# Patient Record
Sex: Male | Born: 1945
Health system: Southern US, Community
[De-identification: ages and names within clinical notes are randomized; demographics above are authoritative.]

## PROBLEM LIST (undated history)

## (undated) DIAGNOSIS — I10 Essential (primary) hypertension: Secondary | ICD-10-CM

## (undated) DIAGNOSIS — E1159 Type 2 diabetes mellitus with other circulatory complications: Secondary | ICD-10-CM

## (undated) DIAGNOSIS — N189 Chronic kidney disease, unspecified: Secondary | ICD-10-CM

## (undated) DIAGNOSIS — I82409 Acute embolism and thrombosis of unspecified deep veins of unspecified lower extremity: Secondary | ICD-10-CM

## (undated) DIAGNOSIS — I739 Peripheral vascular disease, unspecified: Secondary | ICD-10-CM

## (undated) DIAGNOSIS — I5032 Chronic diastolic (congestive) heart failure: Secondary | ICD-10-CM

## (undated) DIAGNOSIS — E119 Type 2 diabetes mellitus without complications: Secondary | ICD-10-CM

## (undated) DIAGNOSIS — M1990 Unspecified osteoarthritis, unspecified site: Secondary | ICD-10-CM

## (undated) DIAGNOSIS — E1169 Type 2 diabetes mellitus with other specified complication: Secondary | ICD-10-CM

## (undated) DIAGNOSIS — I219 Acute myocardial infarction, unspecified: Secondary | ICD-10-CM

## (undated) DIAGNOSIS — I209 Angina pectoris, unspecified: Secondary | ICD-10-CM

## (undated) DIAGNOSIS — Z87438 Personal history of other diseases of male genital organs: Secondary | ICD-10-CM

## (undated) DIAGNOSIS — I509 Heart failure, unspecified: Secondary | ICD-10-CM

## (undated) DIAGNOSIS — I2510 Atherosclerotic heart disease of native coronary artery without angina pectoris: Secondary | ICD-10-CM

## (undated) HISTORY — DX: Atherosclerotic heart disease of native coronary artery without angina pectoris: I25.10

## (undated) HISTORY — DX: Heart failure, unspecified: I50.9

## (undated) HISTORY — DX: Acute myocardial infarction, unspecified: I21.9

## (undated) HISTORY — DX: Unspecified osteoarthritis, unspecified site: M19.90

## (undated) HISTORY — DX: Essential (primary) hypertension: I10

## (undated) HISTORY — DX: Personal history of other diseases of male genital organs: Z87.438

## (undated) HISTORY — DX: Peripheral vascular disease, unspecified: I73.9

## (undated) HISTORY — DX: Type 2 diabetes mellitus without complications: E11.9

## (undated) HISTORY — DX: Acute embolism and thrombosis of unspecified deep veins of unspecified lower extremity: I82.409

## (undated) HISTORY — DX: Chronic diastolic (congestive) heart failure: I50.32

## (undated) HISTORY — PX: HERNIA REPAIR: SHX51

## (undated) HISTORY — DX: Chronic kidney disease, unspecified: N18.9

## (undated) HISTORY — PX: INGUINAL HERNIA REPAIR: SHX194

## (undated) HISTORY — DX: Type 2 diabetes mellitus with other circulatory complications: E11.59

## (undated) HISTORY — DX: Angina pectoris, unspecified: I20.9

---
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
## 2012-05-14 LAB — HM COLONOSCOPY

## 2014-10-27 DIAGNOSIS — H04123 Dry eye syndrome of bilateral lacrimal glands: Secondary | ICD-10-CM | POA: Diagnosis not present

## 2014-10-27 DIAGNOSIS — H02824 Cysts of left upper eyelid: Secondary | ICD-10-CM | POA: Diagnosis not present

## 2014-10-28 DIAGNOSIS — I10 Essential (primary) hypertension: Secondary | ICD-10-CM | POA: Diagnosis not present

## 2014-10-28 DIAGNOSIS — I2510 Atherosclerotic heart disease of native coronary artery without angina pectoris: Secondary | ICD-10-CM | POA: Diagnosis not present

## 2014-10-28 DIAGNOSIS — I5032 Chronic diastolic (congestive) heart failure: Secondary | ICD-10-CM | POA: Diagnosis not present

## 2014-10-28 DIAGNOSIS — E785 Hyperlipidemia, unspecified: Secondary | ICD-10-CM | POA: Diagnosis not present

## 2014-10-28 DIAGNOSIS — I739 Peripheral vascular disease, unspecified: Secondary | ICD-10-CM | POA: Diagnosis not present

## 2015-02-15 DIAGNOSIS — Z7982 Long term (current) use of aspirin: Secondary | ICD-10-CM | POA: Diagnosis not present

## 2015-02-15 DIAGNOSIS — S335XXA Sprain of ligaments of lumbar spine, initial encounter: Secondary | ICD-10-CM | POA: Diagnosis not present

## 2015-02-15 DIAGNOSIS — Z79899 Other long term (current) drug therapy: Secondary | ICD-10-CM | POA: Diagnosis not present

## 2015-02-15 DIAGNOSIS — S39012A Strain of muscle, fascia and tendon of lower back, initial encounter: Secondary | ICD-10-CM | POA: Diagnosis not present

## 2015-02-15 DIAGNOSIS — I10 Essential (primary) hypertension: Secondary | ICD-10-CM | POA: Diagnosis not present

## 2015-03-24 DIAGNOSIS — R05 Cough: Secondary | ICD-10-CM | POA: Diagnosis not present

## 2015-03-24 DIAGNOSIS — R079 Chest pain, unspecified: Secondary | ICD-10-CM | POA: Diagnosis not present

## 2015-03-24 DIAGNOSIS — R0789 Other chest pain: Secondary | ICD-10-CM | POA: Diagnosis not present

## 2015-03-24 DIAGNOSIS — I10 Essential (primary) hypertension: Secondary | ICD-10-CM | POA: Diagnosis not present

## 2015-05-03 DIAGNOSIS — I10 Essential (primary) hypertension: Secondary | ICD-10-CM | POA: Diagnosis not present

## 2015-05-03 DIAGNOSIS — I739 Peripheral vascular disease, unspecified: Secondary | ICD-10-CM | POA: Diagnosis not present

## 2015-05-03 DIAGNOSIS — E785 Hyperlipidemia, unspecified: Secondary | ICD-10-CM | POA: Diagnosis not present

## 2015-05-03 DIAGNOSIS — J019 Acute sinusitis, unspecified: Secondary | ICD-10-CM | POA: Diagnosis not present

## 2015-05-03 DIAGNOSIS — Z6832 Body mass index (BMI) 32.0-32.9, adult: Secondary | ICD-10-CM | POA: Diagnosis not present

## 2015-05-04 DIAGNOSIS — I10 Essential (primary) hypertension: Secondary | ICD-10-CM | POA: Diagnosis not present

## 2015-05-04 DIAGNOSIS — I739 Peripheral vascular disease, unspecified: Secondary | ICD-10-CM | POA: Diagnosis not present

## 2015-05-04 DIAGNOSIS — E785 Hyperlipidemia, unspecified: Secondary | ICD-10-CM | POA: Diagnosis not present

## 2015-11-01 DIAGNOSIS — Z6832 Body mass index (BMI) 32.0-32.9, adult: Secondary | ICD-10-CM | POA: Diagnosis not present

## 2015-11-01 DIAGNOSIS — J180 Bronchopneumonia, unspecified organism: Secondary | ICD-10-CM | POA: Diagnosis not present

## 2015-11-01 DIAGNOSIS — I5032 Chronic diastolic (congestive) heart failure: Secondary | ICD-10-CM | POA: Diagnosis not present

## 2015-11-01 DIAGNOSIS — I10 Essential (primary) hypertension: Secondary | ICD-10-CM | POA: Diagnosis not present

## 2015-11-01 DIAGNOSIS — E785 Hyperlipidemia, unspecified: Secondary | ICD-10-CM | POA: Diagnosis not present

## 2015-11-01 DIAGNOSIS — N400 Enlarged prostate without lower urinary tract symptoms: Secondary | ICD-10-CM | POA: Diagnosis not present

## 2015-11-03 DIAGNOSIS — E785 Hyperlipidemia, unspecified: Secondary | ICD-10-CM | POA: Diagnosis not present

## 2015-11-03 DIAGNOSIS — N400 Enlarged prostate without lower urinary tract symptoms: Secondary | ICD-10-CM | POA: Diagnosis not present

## 2015-11-03 DIAGNOSIS — I10 Essential (primary) hypertension: Secondary | ICD-10-CM | POA: Diagnosis not present

## 2015-11-03 DIAGNOSIS — I5030 Unspecified diastolic (congestive) heart failure: Secondary | ICD-10-CM | POA: Diagnosis not present

## 2015-11-30 DIAGNOSIS — Z6832 Body mass index (BMI) 32.0-32.9, adult: Secondary | ICD-10-CM | POA: Diagnosis not present

## 2015-11-30 DIAGNOSIS — J180 Bronchopneumonia, unspecified organism: Secondary | ICD-10-CM | POA: Diagnosis not present

## 2016-03-23 DIAGNOSIS — L03115 Cellulitis of right lower limb: Secondary | ICD-10-CM | POA: Diagnosis not present

## 2016-03-23 DIAGNOSIS — Z6832 Body mass index (BMI) 32.0-32.9, adult: Secondary | ICD-10-CM | POA: Diagnosis not present

## 2016-03-23 DIAGNOSIS — M79604 Pain in right leg: Secondary | ICD-10-CM | POA: Diagnosis not present

## 2016-03-23 DIAGNOSIS — I82409 Acute embolism and thrombosis of unspecified deep veins of unspecified lower extremity: Secondary | ICD-10-CM | POA: Diagnosis not present

## 2016-03-30 DIAGNOSIS — Z6832 Body mass index (BMI) 32.0-32.9, adult: Secondary | ICD-10-CM | POA: Diagnosis not present

## 2016-03-30 DIAGNOSIS — L03115 Cellulitis of right lower limb: Secondary | ICD-10-CM | POA: Diagnosis not present

## 2016-04-24 DIAGNOSIS — J180 Bronchopneumonia, unspecified organism: Secondary | ICD-10-CM | POA: Diagnosis not present

## 2016-05-01 DIAGNOSIS — I10 Essential (primary) hypertension: Secondary | ICD-10-CM | POA: Diagnosis not present

## 2016-05-01 DIAGNOSIS — N400 Enlarged prostate without lower urinary tract symptoms: Secondary | ICD-10-CM | POA: Diagnosis not present

## 2016-05-01 DIAGNOSIS — I5032 Chronic diastolic (congestive) heart failure: Secondary | ICD-10-CM | POA: Diagnosis not present

## 2016-05-01 DIAGNOSIS — I2510 Atherosclerotic heart disease of native coronary artery without angina pectoris: Secondary | ICD-10-CM | POA: Diagnosis not present

## 2016-05-01 DIAGNOSIS — I739 Peripheral vascular disease, unspecified: Secondary | ICD-10-CM | POA: Diagnosis not present

## 2016-05-01 DIAGNOSIS — E785 Hyperlipidemia, unspecified: Secondary | ICD-10-CM | POA: Diagnosis not present

## 2016-05-26 DIAGNOSIS — R252 Cramp and spasm: Secondary | ICD-10-CM | POA: Diagnosis not present

## 2016-06-26 DIAGNOSIS — J019 Acute sinusitis, unspecified: Secondary | ICD-10-CM | POA: Diagnosis not present

## 2016-10-30 DIAGNOSIS — Z23 Encounter for immunization: Secondary | ICD-10-CM | POA: Diagnosis not present

## 2016-10-30 DIAGNOSIS — I10 Essential (primary) hypertension: Secondary | ICD-10-CM | POA: Diagnosis not present

## 2016-10-30 DIAGNOSIS — E782 Mixed hyperlipidemia: Secondary | ICD-10-CM | POA: Diagnosis not present

## 2016-10-30 DIAGNOSIS — I5032 Chronic diastolic (congestive) heart failure: Secondary | ICD-10-CM | POA: Diagnosis not present

## 2016-10-30 DIAGNOSIS — I2510 Atherosclerotic heart disease of native coronary artery without angina pectoris: Secondary | ICD-10-CM | POA: Diagnosis not present

## 2016-10-30 DIAGNOSIS — N400 Enlarged prostate without lower urinary tract symptoms: Secondary | ICD-10-CM | POA: Diagnosis not present

## 2016-10-30 DIAGNOSIS — N401 Enlarged prostate with lower urinary tract symptoms: Secondary | ICD-10-CM | POA: Diagnosis not present

## 2017-01-23 DIAGNOSIS — M19072 Primary osteoarthritis, left ankle and foot: Secondary | ICD-10-CM | POA: Diagnosis not present

## 2017-01-23 DIAGNOSIS — M659 Synovitis and tenosynovitis, unspecified: Secondary | ICD-10-CM | POA: Diagnosis not present

## 2017-01-23 DIAGNOSIS — M19071 Primary osteoarthritis, right ankle and foot: Secondary | ICD-10-CM | POA: Diagnosis not present

## 2017-01-30 DIAGNOSIS — M7751 Other enthesopathy of right foot: Secondary | ICD-10-CM | POA: Diagnosis not present

## 2017-01-30 DIAGNOSIS — M659 Synovitis and tenosynovitis, unspecified: Secondary | ICD-10-CM | POA: Diagnosis not present

## 2017-01-30 DIAGNOSIS — M7752 Other enthesopathy of left foot: Secondary | ICD-10-CM | POA: Diagnosis not present

## 2017-03-01 DIAGNOSIS — E782 Mixed hyperlipidemia: Secondary | ICD-10-CM | POA: Diagnosis not present

## 2017-03-01 DIAGNOSIS — R7303 Prediabetes: Secondary | ICD-10-CM | POA: Diagnosis not present

## 2017-03-01 DIAGNOSIS — N400 Enlarged prostate without lower urinary tract symptoms: Secondary | ICD-10-CM | POA: Diagnosis not present

## 2017-03-01 DIAGNOSIS — I2510 Atherosclerotic heart disease of native coronary artery without angina pectoris: Secondary | ICD-10-CM | POA: Diagnosis not present

## 2017-03-01 DIAGNOSIS — I5032 Chronic diastolic (congestive) heart failure: Secondary | ICD-10-CM | POA: Diagnosis not present

## 2017-03-01 DIAGNOSIS — I10 Essential (primary) hypertension: Secondary | ICD-10-CM | POA: Diagnosis not present

## 2017-07-03 DIAGNOSIS — E782 Mixed hyperlipidemia: Secondary | ICD-10-CM | POA: Diagnosis not present

## 2017-07-03 DIAGNOSIS — Z125 Encounter for screening for malignant neoplasm of prostate: Secondary | ICD-10-CM | POA: Diagnosis not present

## 2017-07-03 DIAGNOSIS — R7303 Prediabetes: Secondary | ICD-10-CM | POA: Diagnosis not present

## 2017-07-03 DIAGNOSIS — I2510 Atherosclerotic heart disease of native coronary artery without angina pectoris: Secondary | ICD-10-CM | POA: Diagnosis not present

## 2017-07-03 DIAGNOSIS — N400 Enlarged prostate without lower urinary tract symptoms: Secondary | ICD-10-CM | POA: Diagnosis not present

## 2017-07-03 DIAGNOSIS — E1159 Type 2 diabetes mellitus with other circulatory complications: Secondary | ICD-10-CM | POA: Diagnosis not present

## 2017-07-03 DIAGNOSIS — I5032 Chronic diastolic (congestive) heart failure: Secondary | ICD-10-CM | POA: Diagnosis not present

## 2017-07-03 DIAGNOSIS — I10 Essential (primary) hypertension: Secondary | ICD-10-CM | POA: Diagnosis not present

## 2017-07-03 DIAGNOSIS — M170 Bilateral primary osteoarthritis of knee: Secondary | ICD-10-CM | POA: Diagnosis not present

## 2017-07-09 ENCOUNTER — Other Ambulatory Visit: Payer: Self-pay

## 2017-07-09 NOTE — Patient Outreach (Signed)
[facility]) Care Management  07/09/2017  Johansel Steiner Schollmeyer [birth year] [id number]   Telephone Screen  Referral Date: 07/09/17 Referral Source: MD office( Dr Marina Goodell) Referral Reason: "Diabetic Education" Insurance: [facility]   Outreach attempt # 1  to patient at [phone]. Male answered and reported wrong number. RN CM attempted alternate number([phone]) found in MD notes. No answer at present. RN CM left HIPAA compliant voicemail message along with contact info.       Plan: RN CM will make outreach attempt to patient within three business days if no return call.    Antionette Fairy, RN,BSN,CCM [facility] Care Management Telephonic Care Management Coordinator Direct Phone: [phone] Toll Free: [phone] Fax: [phone]

## 2017-07-12 ENCOUNTER — Other Ambulatory Visit: Payer: Self-pay

## 2017-07-12 NOTE — Patient Outreach (Signed)
[facility]) Care Management  07/12/2017  Mark [name] 1946-03-30 [id number]   Telephone Screen  Referral Date: 07/09/17 Referral Source: MD office( Dr Marina Goodell) Referral Reason: "Diabetic Education" Insurance: [facility]    Outreach attempt #2 to patient. Spoke with patient. Screening completed.   Social: Patient states he resides in his home alone. He voices he is independent with ADLs/IADLs. He is able to drive himself to appts. He denies any falls and having any DME in the home.   Conditions: Patient has PMH of CKD, HTN, HLD, bilateral osteoarthritis to knees, HF(patient denies) and DM. Patient states he was just told at last MD appt that he has Diabetes. Per notes patient was diagnosed in July 2018. He is currently not taking any meds and monitoring cbgs in the home as diabetes is fairly controlled. He voices MD has not advised him to monitor cbgs yet.Lat A1C was 6.1(07/03/17). He voices that MD has set him up to attend a three day Diabetes class which is first session is on 07/18/17. Patient voices he wants all the education and support he can get to help keep diabetes under controlled. He states that his sister has diabetes and has to take insulin daily. Patient reported he "would rather die than take insulin." He stets he is just that terrified of shots and needles. Patient inquired if he was going to die soon" from this diabetes. Advised patient that this was not a death sentence and people with diabetes can live a long time if they mange what they eat and adhere to diabetic rules and MD orders. He voiced understanding and felt better knowing this.   Medications: Patient reports he takes seven pills one day and the next day five pills. He self manages his meds. He gets meds through [facility] mail order for free. He report the only med he pays for is Nitro tabs which he rarely ever has to take.  Appointments: He voices that he is only followed by his PCP and  goes back 07/30/17.  Advance Directives: Patient does not have any. RN CM provided education on importance of completing forms. He will think about it.  Consent: [facility] services reviewed and discussed with patient. Patient gave verbal consent for [facility] services.    Plan: RN CM will notify [facility] administrative assistant of case status. RN CM will send [facility] RN Health Coach referral for further Diabetes education and support.  Antionette Fairy, RN,BSN,CCM [facility] Care Management Telephonic Care Management Coordinator Direct Phone: [phone] Toll Free: [phone] Fax: [phone]

## 2017-07-13 ENCOUNTER — Encounter: Payer: Self-pay | Admitting: *Deleted

## 2017-07-18 DIAGNOSIS — E1159 Type 2 diabetes mellitus with other circulatory complications: Secondary | ICD-10-CM | POA: Diagnosis not present

## 2017-07-20 ENCOUNTER — Other Ambulatory Visit: Payer: Self-pay | Admitting: *Deleted

## 2017-07-20 ENCOUNTER — Encounter: Payer: Self-pay | Admitting: *Deleted

## 2017-07-20 ENCOUNTER — Other Ambulatory Visit: Payer: Self-pay

## 2017-07-20 DIAGNOSIS — N183 Chronic kidney disease, stage 3 unspecified: Secondary | ICD-10-CM

## 2017-07-20 DIAGNOSIS — E119 Type 2 diabetes mellitus without complications: Secondary | ICD-10-CM | POA: Insufficient documentation

## 2017-07-20 DIAGNOSIS — I25119 Atherosclerotic heart disease of native coronary artery with unspecified angina pectoris: Secondary | ICD-10-CM | POA: Insufficient documentation

## 2017-07-20 DIAGNOSIS — I10 Essential (primary) hypertension: Secondary | ICD-10-CM

## 2017-07-20 DIAGNOSIS — E785 Hyperlipidemia, unspecified: Secondary | ICD-10-CM | POA: Insufficient documentation

## 2017-07-20 DIAGNOSIS — I82409 Acute embolism and thrombosis of unspecified deep veins of unspecified lower extremity: Secondary | ICD-10-CM | POA: Insufficient documentation

## 2017-07-20 DIAGNOSIS — I5032 Chronic diastolic (congestive) heart failure: Secondary | ICD-10-CM | POA: Insufficient documentation

## 2017-07-20 DIAGNOSIS — Z87438 Personal history of other diseases of male genital organs: Secondary | ICD-10-CM | POA: Insufficient documentation

## 2017-07-20 DIAGNOSIS — I2510 Atherosclerotic heart disease of native coronary artery without angina pectoris: Secondary | ICD-10-CM | POA: Insufficient documentation

## 2017-07-20 DIAGNOSIS — I219 Acute myocardial infarction, unspecified: Secondary | ICD-10-CM | POA: Insufficient documentation

## 2017-07-20 DIAGNOSIS — I739 Peripheral vascular disease, unspecified: Secondary | ICD-10-CM

## 2017-07-20 HISTORY — DX: Atherosclerotic heart disease of native coronary artery with unspecified angina pectoris: I25.119

## 2017-07-20 HISTORY — DX: Hyperlipidemia, unspecified: E78.5

## 2017-07-20 NOTE — Patient Outreach (Signed)
[facility] Care Management  [facility]  07/20/2017   Tedd SiasClarence L [name] 10/19/1945 [id number]  RN Health Coach Initial Assessment  Referral Date:  07/12/2017 Referral Source:  MD Office (Dr. Marina GoodellPerry) Reason for Referral:  Diabetes Education and support Insurance:  [facility]   Outreach Attempt: Successful telephone outreach to patient.  HIPAA verified.  Initial telephone assessment completed with patient.  Social:  Patient lives at home with girlfriend.  Independent with ADLs and IADLs.  Currently still drives and transports himself to his medical appointments.  DME in home include CBG meter he just received and has not used it yet.  Patient has not been educated on how to self monitor blood sugars as of yet, and states he is in the process of learning this task at his diabetes classes.  Conditions:  Per chart review and discussion with patient, PMH include:  Newly diagnosed diabetes, CHF, MI, hyperlipidemia, hypertension, chronic kidney disease, BPH, peripheral vascular disease, DVT, and coronary artery disease.  Patient reports he was told previously he was prediabetic and at his recent medical appointment, he was told he is now a diabetic.  Current A1C is 6.1.  He is attending Diabetes Education classes, a series of 3 courses.  Attended his first course 07/18/2017, the next 2 are scheduled for 07/23/2017 and 07/25/2017. Patient states they are going to teach him how to use his CBG meter and monitor his blood glucose during these educational sessions.  Reports being very active, walking 5 miles daily with plans to start lifting weights in the gym.  Patient states he had a fall without injury this past Monday after his foot got stuck in the mud.  Falls prevention discussed with patient.  Reports he has several missing front teeth, making it difficult to chew foods.  Encouraged patient to make dental appointment.  Medications: Patient reports compliance with daily  medications and denies issues with affording medications.  Encounter Medications:  Outpatient Encounter Medications as of 07/20/2017  Medication Sig  . aspirin 81 MG chewable tablet Chew by mouth daily.  Marland Kitchen. atorvastatin (LIPITOR) 20 MG tablet Take 20 mg by mouth daily.  . carvedilol (COREG) 6.25 MG tablet Take 6.25 mg by mouth daily.  . furosemide (LASIX) 20 MG tablet Take 20 mg by mouth daily.  . magnesium 30 MG tablet Take 30 mg by mouth daily.  . metFORMIN (GLUCOPHAGE) 500 MG tablet Take by mouth daily with breakfast.  . nitroGLYCERIN (NITROSTAT) 0.4 MG SL tablet Place 0.4 mg under the tongue every 5 (five) minutes as needed for chest pain.  Marland Kitchen. spironolactone (ALDACTONE) 25 MG tablet Take 25 mg by mouth daily.   No facility-administered encounter medications on file as of 07/20/2017.     Functional Status:  In your present state of health, do you have any difficulty performing the following activities: 07/20/2017  Hearing? N  Vision? N  Difficulty concentrating or making decisions? N  Walking or climbing stairs? N  Dressing or bathing? N  Doing errands, shopping? N  Preparing Food and eating ? N  Using the Toilet? N  In the past six months, have you accidently leaked urine? N  Do you have problems with loss of bowel control? N  Managing your Medications? N  Managing your Finances? N  Housekeeping or managing your Housekeeping? N  Some recent data might be hidden    Fall/Depression Screening: Fall Risk  07/20/2017 07/12/2017  Falls in the past year? Yes No  Number falls in  past yr: 1 -  Injury with Fall? No -  Risk for fall due to : History of fall(s) -  Follow up Education provided;Falls prevention discussed -   PHQ 2/9 Scores 07/20/2017 07/12/2017  PHQ - 2 Score 0 0   THN CM Care Plan Problem One     Most Recent Value  Care Plan Problem One  Knowledge deficiet related to diagnosis of diabetes  Role Documenting the Problem One  Health Coach  Care Plan for Problem One   Active  THN Long Term Goal   Patient will lose 10 pounds in the next 90 days.  THN Long Term Goal Start Date  07/20/17  Interventions for Problem One Long Term Goal  Current care plan reviewed and discussed with patient, encouraged to keep medical appointment, encouarged to continue to attend diabetes educational classes, congratualted and encouraged  continuing to walk 5 miles a day  [facility] CM Short Term Goal #1   Patient will learn to perform self CBG monitoring in the next 30 days.  THN CM Short Term Goal #1 Start Date  07/20/17  Interventions for Short Term Goal #1  Congratulated patient on obtaining CBG meter, encouraged patient to continue to attend diabetes educational classes, encouraged patient to take meter with him to class to have someone instruct him on CBG monitoring, Sending Living Well with Diabetes Booklet to patient  [facility] CM Short Term Goal #2   Patient will review diabetes educational booklet and material in the next 30 days.  THN CM Short Term Goal #2 Start Date  07/20/17  Interventions for Short Term Goal #2  RN Health Coach to send Living Well with Diabetes Booklet, encouraged patient to continue with diabetes educational classes     Appointments:  Patient last saw, Dr. Marina Goodell, Primary Care Provider on 07/03/2017.  Next medical appointment is scheduled for 07/30/2017.  Diabetes Educational classes are scheduled as above.  Advanced Directives:   Patient denies advance directive in place.  Requesting information on advance directives.   Consent:  [facility] discussed and reviewed with patient.  Patient verbally consents to [facility].  Plan: RN Health Coach to send patient Conservation officer, historic buildings. RN Health Coach to send patient Living Well with Diabetes Booklet. RN Health Coach to send patient 2019 Calendar Booklet. RN Health Coach to send patient Forensic scientist. Patient to review educational material over the next month. RN Health Coach will send primary MD barriers  letter. RN Health Coach to make next monthly telephone outreach in the month of January.  [name] RN [facility] Care Management  RN Health Coach [phone] [email or web address]

## 2017-07-25 DIAGNOSIS — E1159 Type 2 diabetes mellitus with other circulatory complications: Secondary | ICD-10-CM | POA: Diagnosis not present

## 2017-07-30 DIAGNOSIS — Z6833 Body mass index (BMI) 33.0-33.9, adult: Secondary | ICD-10-CM | POA: Diagnosis not present

## 2017-07-30 DIAGNOSIS — Z0000 Encounter for general adult medical examination without abnormal findings: Secondary | ICD-10-CM | POA: Diagnosis not present

## 2017-08-15 DIAGNOSIS — E1159 Type 2 diabetes mellitus with other circulatory complications: Secondary | ICD-10-CM | POA: Diagnosis not present

## 2017-08-21 ENCOUNTER — Other Ambulatory Visit: Payer: Self-pay | Admitting: *Deleted

## 2017-08-21 ENCOUNTER — Encounter: Payer: Self-pay | Admitting: *Deleted

## 2017-08-21 NOTE — Patient Outreach (Signed)
[facility]) Care Management  08/21/2017  Mark [name] 12/10/1945 [id number]  RN Health Coach Monthly Outreach  Referral Date:  07/12/2017 Referral Source:  MD Office (Dr. Marina Goodell) Reason for Referral:  Diabetes Education and Support Insurance:  [facility] Medicare   Outreach Attempt:  Successful telephone outreach to patient for monthly follow up.  HIPAA confirmed.  Patient stating he is doing well, in the middle of his morning walk currently.  States he has completed the 3 course Diabetes Educational Classes last month and has follow up class in April.  Patient reports he was taught how to take his blood sugars with the his CBG meter in the classes and has been monitoring his blood sugars daily.  States he has not taken CBG yet this morning.  When asked about fasting blood sugar ranges, patient states "they have been around 6.9".  Discussed normal CBG ranges with patient.  Patient states he will check his CBG meter when he returns home from his walk.  Encouraged patient to contact RN Health Coach if CBG reading seems out of normal numbers.  Verbalizes he has received packets from [facility] and continues to review material mailed to him.  Appointments:  Patient attended scheduled appointment with Dr. Marina Goodell on 07/30/2017 and has next follow up appointment in March 2019.  States he has a follow up diabetes educational appointment in April 2019.  Plan:  RN Health Coach will make next monthly telephone outreach to patient in the month of February.   [name] RN [facility] Care Management  RN Health Coach [phone] [email or web address]

## 2017-09-18 ENCOUNTER — Other Ambulatory Visit: Payer: Self-pay | Admitting: *Deleted

## 2017-09-18 ENCOUNTER — Encounter: Payer: Self-pay | Admitting: *Deleted

## 2017-09-18 NOTE — Patient Outreach (Signed)
[facility]) Care Management  09/18/2017  Mark [name] 06-28-1946 [id number]   RN Health Coach Monthly Outreach  Referral Date:  07/12/2017 Referral Source:  MD Office (Dr. Marina Goodell) Reason for Referral:  Diabetes Education and Support Insurance:  [facility] Medicare   Outreach Attempt:  Successful telephone outreach to patient for monthly follow up.  HIPAA verified with patient.  Patient stating he is doing well.  Verbalizes he has not been able to monitor his blood sugars because he does not know how to use the meter.  States he was told to bring his meter to the next Diabetes class in April, although his physician wants his blood sugars monitored daily.  Patient encouraged to contact the physician to verify how often he is to monitor his blood sugars and to go by the physician's office to see if there is a staff member there to educate him on how to use the CBG meter.  Continues to walk daily for exercise.  Appointments:  Patient reports having a scheduled appointment with Dr. Marina Goodell on 11/13/2017 and having Diabetes Education class on 11/13/2017 also.  Plan: RN Health Coach will send patient EMMI on Low Blood Sugar Discharge Instructions, Adult. RN Health Coach will make next monthly outreach to patient in the month of March.   [name] RN [facility] Care Management  RN Health Coach [phone] [email or web address]

## 2017-09-25 ENCOUNTER — Other Ambulatory Visit: Payer: Self-pay | Admitting: *Deleted

## 2017-09-25 NOTE — Patient Outreach (Signed)
[facility]) Care Management  09/25/2017  Rocko Yarman Sunday [birth year] [id number]   RN Health Coach Monthly Outreach  Referral Date:07/12/2017 Referral Source:MD Office (Dr. Marina Goodell) Reason for Referral:Diabetes Education and Support Insurance:[employer]   Outreach Attempt:  Received telephone call from patient.  HIPAA verified with patient.  Patient received education letter with EMMI for hypoglycemia and was calling to confirm letter was from [employer].  Reviewed Educational Letter with patient and EMMI on hypoglycemia.  Discussed need for patient to learn signs and symptoms of hypoglycemia and explained the EMMI education was in addition to the Living Well with Diabetes Booklet.  Patient stating he has his medical follow up appointment on March 20 and request RN Health Coach do monthly telephone call after this appointment.  Patient encouraged to take his meter to this follow up appointment, if not sooner, to have staff assist him in using the meter.  Plan:  RN Health Coach will reschedule monthly telephone outreach to patient for the month of March after 10/31/2017 per patient's request.  [name] RN [facility] Care Management  RN Health Coach [phone] [email or web address]

## 2017-10-19 ENCOUNTER — Ambulatory Visit: Payer: Medicare HMO | Admitting: *Deleted

## 2017-10-31 DIAGNOSIS — I5032 Chronic diastolic (congestive) heart failure: Secondary | ICD-10-CM | POA: Diagnosis not present

## 2017-10-31 DIAGNOSIS — N400 Enlarged prostate without lower urinary tract symptoms: Secondary | ICD-10-CM | POA: Diagnosis not present

## 2017-10-31 DIAGNOSIS — I110 Hypertensive heart disease with heart failure: Secondary | ICD-10-CM | POA: Diagnosis not present

## 2017-10-31 DIAGNOSIS — E1159 Type 2 diabetes mellitus with other circulatory complications: Secondary | ICD-10-CM | POA: Diagnosis not present

## 2017-10-31 DIAGNOSIS — M170 Bilateral primary osteoarthritis of knee: Secondary | ICD-10-CM | POA: Diagnosis not present

## 2017-10-31 DIAGNOSIS — I2510 Atherosclerotic heart disease of native coronary artery without angina pectoris: Secondary | ICD-10-CM | POA: Diagnosis not present

## 2017-10-31 DIAGNOSIS — E782 Mixed hyperlipidemia: Secondary | ICD-10-CM | POA: Diagnosis not present

## 2017-10-31 DIAGNOSIS — I10 Essential (primary) hypertension: Secondary | ICD-10-CM | POA: Diagnosis not present

## 2017-10-31 DIAGNOSIS — E119 Type 2 diabetes mellitus without complications: Secondary | ICD-10-CM | POA: Diagnosis not present

## 2017-11-02 ENCOUNTER — Encounter: Payer: Self-pay | Admitting: *Deleted

## 2017-11-02 ENCOUNTER — Other Ambulatory Visit: Payer: Self-pay | Admitting: *Deleted

## 2017-11-02 NOTE — Patient Outreach (Signed)
[facility]) Care Management  [place]  11/02/2017   Mark [name] 25-Jun-1946 [id number]   RN Health Coach Monthly Outreach   Referral Date:  07/12/2017 Referral Source:  MD Office (Dr. Henrene Pastor) Reason for Referral:  Diabetes Education and Support Insurance:  [facility] Medicare   Outreach Attempt:  Successful telephone outreach to patient for monthly follow up.  HIPAA verified with patient.  Patient states he continues to do well.  States he has learned how to use his CBG meter and has been monitoring his blood sugars twice a day, in the morning and at bedtime.  Reports this morning blood sugar was 107 with ranges in the 100's.  States his bedtime blood sugars have ranged in the 100's as well.  Verbalizes the staff at his primary care providers office assisted him in learning to use his CBG meter.  Continues to walk daily.  Discussed with patient the difference between Hgb A1C and blood sugars recorded with CBG meter.  Patient reports recording his CBG readings in his Calendar booklet.  Encounter Medications:  Outpatient Encounter Medications as of 11/02/2017  Medication Sig  . aspirin 81 MG chewable tablet Chew by mouth daily.  Marland Kitchen atorvastatin (LIPITOR) 20 MG tablet Take 20 mg by mouth daily.  . carvedilol (COREG) 6.25 MG tablet Take 6.25 mg by mouth daily.  . furosemide (LASIX) 20 MG tablet Take 20 mg by mouth daily.  . magnesium 30 MG tablet Take 30 mg by mouth daily.  . metFORMIN (GLUCOPHAGE) 500 MG tablet Take by mouth daily with breakfast.  . nitroGLYCERIN (NITROSTAT) 0.4 MG SL tablet Place 0.4 mg under the tongue every 5 (five) minutes as needed for chest pain.  Marland Kitchen spironolactone (ALDACTONE) 25 MG tablet Take 25 mg by mouth daily.   No facility-administered encounter medications on file as of 11/02/2017.     Functional Status:  In your present state of health, do you have any difficulty performing the following activities: 07/20/2017  Hearing? N  Vision?  N  Difficulty concentrating or making decisions? N  Walking or climbing stairs? N  Dressing or bathing? N  Doing errands, shopping? N  Preparing Food and eating ? N  Using the Toilet? N  In the past six months, have you accidently leaked urine? N  Do you have problems with loss of bowel control? N  Managing your Medications? N  Managing your Finances? N  Housekeeping or managing your Housekeeping? N  Some recent data might be hidden    Fall/Depression Screening: Fall Risk  08/21/2017 07/20/2017 07/12/2017  Falls in the past year? (No Data) Yes No  Comment reports no falls in the last month since our last conversation - -  Number falls in past yr: 1 1 -  Injury with Fall? No No -  Risk for fall due to : History of fall(s) History of fall(s) -  Follow up Education provided;Falls prevention discussed Education provided;Falls prevention discussed -   PHQ 2/9 Scores 07/20/2017 07/12/2017  PHQ - 2 Score 0 0    THN CM Care Plan Problem One     Most Recent Value  Care Plan Problem One  Knowledge deficiet related to diagnosis of diabetes  Role Documenting the Problem One  Health Coach  Care Plan for Problem One  Active  THN Long Term Goal   Patient will lose 10 pounds in the next 90 days.  THN Long Term Goal Start Date  11/02/17  [facility] Long Term Goal Met Date  11/02/17  Interventions for Problem One Long Term Goal  Current care plan reviewed and discussed with patient, encouraged patient to keep and attend medical appointment, encouraged patient to obtain home scale for monitoring weight for weight loss goals,  encouraged patient to contact [employer] to verify Over the Counter money benefit to assist with the purchase of a scale, congratulated and encouraged patient to continue to walk 5 miles a day, congratulated patient on the weight loss so far, discussed healthier meal options, encouraged patient to continue to review diabetes educational material  [facility] CM Short Term Goal #1   Patient will be  able to verbalize 4 signs and symptoms of hyperglycemia in the next 30 days  THN CM Short Term Goal #1 Start Date  11/02/17  [facility] CM Short Term Goal #1 Met Date  11/02/17  Interventions for Short Term Goal #1  Reviewed with patient signs and symptoms of hyperglycemia, encouraged patient to review Living Well with Diabetes booklet, discussed the importance of recognizing symptoms of hyperglycemia  THN CM Short Term Goal #2   Patient will verbalize 4 signs and symptoms of hypoglycemia within the next 30 days.  THN CM Short Term Goal #2 Start Date  11/02/17  Interventions for Short Term Goal #2  Reviewed the importance of knowing the signs and symptoms of hypoglycemia, verbally reviewed the signs of hypoglycemia, encouraged patient to continue to review Living Well with Diabetes booklet, encouraged patient to review EMMIs sent on hypoglycemia, encouraged patient to keep and attend last nutrition class on diabetes, educated patient on what Hgb A1c is, discussed with patient normal blood sugar ranges and what defines hypo or hyperglycemia      Appointments:  Patient reports seeing Dr. Henrene Pastor on 10/31/2017 and has follow up appointment scheduled for 03/04/2018.  States he attends his last Diabetic Nutrition class on 11/13/2017.  Plan: RN Health Coach will send patient EMMI on Gout per his request. Mark [name] will route Quarterly Update to primary care provider. RN Health Coach will make next monthly telephone outreach to patient in the month of April.  [place] [phone] Mark [name]

## 2017-11-05 DIAGNOSIS — H2513 Age-related nuclear cataract, bilateral: Secondary | ICD-10-CM | POA: Diagnosis not present

## 2017-11-05 DIAGNOSIS — E119 Type 2 diabetes mellitus without complications: Secondary | ICD-10-CM | POA: Diagnosis not present

## 2017-11-26 ENCOUNTER — Other Ambulatory Visit: Payer: Self-pay | Admitting: *Deleted

## 2017-11-26 NOTE — Patient Outreach (Signed)
[facility]) Care Management  11/26/2017  Mark [name] March 01, 1946 [id number]   RN Health Coach Monthly Outreach  Referral Date:07/12/2017 Referral Source:MD Office (Dr. Marina Goodell) Reason for Referral:Diabetes Education and Support Insurance:[employer]   Outreach Attempt:  Outreach attempt #1 to patient for monthly follow up. No answer. RN Health Coach left HIPAA compliant voicemail message along with contact information.  Plan:  RN Health Coach will send unsuccessful outreach letter to patient.  RN Health Coach will make another outreach attempt to patient within 3-4 business days if no return call back from patient.  Mark Lerner RN [facility] Care Management  RN Health Coach [phone] [name]

## 2017-11-27 ENCOUNTER — Encounter: Payer: Self-pay | Admitting: *Deleted

## 2017-11-27 NOTE — Telephone Encounter (Signed)
This encounter was created in error - please disregard.

## 2017-11-29 ENCOUNTER — Encounter: Payer: Self-pay | Admitting: *Deleted

## 2017-11-29 ENCOUNTER — Other Ambulatory Visit: Payer: Self-pay | Admitting: *Deleted

## 2017-11-29 NOTE — Patient Outreach (Signed)
[facility]) Care Management  11/29/2017  Mark [name] July 10, 1946 [id number]   RN Health Coach Monthly Outreach  Referral Date:07/12/2017 Referral Source:MD Office (Dr. Marina Goodell) Reason for Referral:Diabetes Education and Support Insurance:[employer]   Outreach Attempt:  Successful telephone outreach to patient for monthly follow up.  HIPAA verified with patient.  Patient stating he has been doing well.  Continues to monitor his blood sugars twice a day.  This mornings fasting reading was 107 with recent ranges of 90-100's.  Last Hgb A1C was 6.1 on 10/31/2017.  Denies any hypo or hyperglycemic episodes that he is aware of.  Denies any sick days.  States he attended his last Diabetes Education class 11/13/2017.  Does report some soreness in his knee.  Denies any falls.  Encouraged patient to discuss knee soreness with his healthcare provider.  Appointments:  Reports last seeing his primary care provider, Dr. Marina Goodell on 10/31/2017 and having scheduled follow up appointment on 03/04/2018.  Plan:   RN Health Coach will make next monthly outreach to patient in the month of May.   [name] RN [facility] Care Management  RN Health Coach [phone] [email or web address]

## 2017-11-29 NOTE — Patient Outreach (Signed)
[facility]) Care Management  11/29/2017  [place] Malina Och 11-02-1945 [id number]   RN Health Coach Monthly Outreach  Referral Date:07/12/2017 Referral Source:MD Office (Dr. Marina Goodell) Reason for Referral:Diabetes Education and Support Insurance:[employer]   Outreach Attempt:  Outreach attempt #2 to patient for monthly follow up.  Patient answered and stated he was driving and requesting a call back later today.  Plan:  RN Health Coach will attempt another telephone outreach later today per patient request.  [name] RN [facility] Care Management  RN Health Coach [phone] [email or web address]

## 2018-01-01 ENCOUNTER — Other Ambulatory Visit: Payer: Self-pay | Admitting: *Deleted

## 2018-01-01 ENCOUNTER — Encounter: Payer: Self-pay | Admitting: *Deleted

## 2018-01-01 NOTE — Patient Outreach (Signed)
[facility]) Care Management  01/01/2018  Mark [name] 1946/01/29 [id number]   RN Health Coach Monthly Outreach  Referral Date:07/12/2017 Referral Source:MD Office (Dr. Marina Goodell) Reason for Referral:Diabetes Education and Support Insurance:[employer] Medicare   Outreach Attempt:  Successful telephone outreach to patient for monthly follow up.  Patient reports he continues to do well.  Continues to monitor his blood sugars twice a day.  Fasting blood sugar this morning was 108 with fasting ranges of 90-130's.  Denies any hypoglycemic or hyperglycemic events.  Denies any sick days but reports continued soreness.  Able to continue to ambulate his normal 5 miles a day.  Does report continued knee pain and soreness.  Encouraged patient to discuss knee pain/soreness with his primary care provider.  Discussed with patient advancing to every other month telephone outreaches.  Patient agreeable.  Appointments:  Mark [name] he has a scheduled medical appointment on 03/04/2018.  Patient encouraged to keep and attend appointments.  Plan:  RN Health Coach will make next monthly outreach to patient in the month of July.   [name] RN [facility] Care Management  RN Health Coach [phone] [email or web address]

## 2018-03-04 DIAGNOSIS — E1121 Type 2 diabetes mellitus with diabetic nephropathy: Secondary | ICD-10-CM | POA: Diagnosis not present

## 2018-03-04 DIAGNOSIS — I10 Essential (primary) hypertension: Secondary | ICD-10-CM | POA: Diagnosis not present

## 2018-03-04 DIAGNOSIS — E782 Mixed hyperlipidemia: Secondary | ICD-10-CM | POA: Diagnosis not present

## 2018-03-04 DIAGNOSIS — N400 Enlarged prostate without lower urinary tract symptoms: Secondary | ICD-10-CM | POA: Diagnosis not present

## 2018-03-04 DIAGNOSIS — I5032 Chronic diastolic (congestive) heart failure: Secondary | ICD-10-CM | POA: Diagnosis not present

## 2018-03-04 DIAGNOSIS — Z6832 Body mass index (BMI) 32.0-32.9, adult: Secondary | ICD-10-CM | POA: Diagnosis not present

## 2018-03-04 DIAGNOSIS — I2510 Atherosclerotic heart disease of native coronary artery without angina pectoris: Secondary | ICD-10-CM | POA: Diagnosis not present

## 2018-03-04 DIAGNOSIS — E1159 Type 2 diabetes mellitus with other circulatory complications: Secondary | ICD-10-CM | POA: Diagnosis not present

## 2018-03-04 DIAGNOSIS — M170 Bilateral primary osteoarthritis of knee: Secondary | ICD-10-CM | POA: Diagnosis not present

## 2018-03-05 ENCOUNTER — Encounter: Payer: Self-pay | Admitting: *Deleted

## 2018-03-05 ENCOUNTER — Other Ambulatory Visit: Payer: Self-pay | Admitting: *Deleted

## 2018-03-05 NOTE — Patient Outreach (Signed)
[facility]) Care Management  [place]  03/05/2018   Lauri Till Auker 01/12/1946 [id number]   RN Health Coach Monthly Outreach   Referral Date:  07/12/2017 Referral Source:  MD Office (Dr. Henrene Pastor) Reason for Referral:  Diabetes Education and Support Insurance:  [facility] Medicare   Outreach Attempt:  Successful telephone outreach to patient for monthly follow up.  HIPAA verified with patient.  Patient reporting he is doing well.  States he attended medical appointment yesterday and was told today all his labs were fine.  Does not remember what his Hgb A1C result was.  Reports continued daily monitoring of his blood sugars.  Fasting blood sugar this morning was 100 with fasting ranges of 98-124.  Denies any hypo or hyperglycemic episodes.  Denies any falls.  States he has a scale at home that does not work; encouraged patient to purchase working scale to assist with weight monitoring with weight loss goals. Discussed with patient nearing his Diabetes goals and patient agreeable to quarterly outreaches.  Encounter Medications:  Outpatient Encounter Medications as of 03/05/2018  Medication Sig Note  . aspirin 81 MG chewable tablet Chew by mouth daily.   Marland Kitchen atorvastatin (LIPITOR) 20 MG tablet Take 20 mg by mouth daily.   . carvedilol (COREG) 6.25 MG tablet Take 6.25 mg by mouth daily.   . furosemide (LASIX) 20 MG tablet Take 20 mg by mouth daily. 03/05/2018: Reports taking 1 tablet every other day  . magnesium 30 MG tablet Take 30 mg by mouth daily.   . metFORMIN (GLUCOPHAGE) 500 MG tablet Take by mouth daily with breakfast.   . nitroGLYCERIN (NITROSTAT) 0.4 MG SL tablet Place 0.4 mg under the tongue every 5 (five) minutes as needed for chest pain.   Marland Kitchen spironolactone (ALDACTONE) 25 MG tablet Take 25 mg by mouth daily. 03/05/2018: Reports taking 1 tablet every other day   No facility-administered encounter medications on file as of 03/05/2018.     Functional Status:  In  your present state of health, do you have any difficulty performing the following activities: 07/20/2017  Hearing? N  Vision? N  Difficulty concentrating or making decisions? N  Walking or climbing stairs? N  Dressing or bathing? N  Doing errands, shopping? N  Preparing Food and eating ? N  Using the Toilet? N  In the past six months, have you accidently leaked urine? N  Do you have problems with loss of bowel control? N  Managing your Medications? N  Managing your Finances? N  Housekeeping or managing your Housekeeping? N  Some recent data might be hidden    Fall/Depression Screening: Fall Risk  03/05/2018 11/29/2017 08/21/2017  Falls in the past year? No No (No Data)  Comment denies any recent falls denies any recent falls reports no falls in the last month since our last conversation  Number falls in past yr: 1 - 1  Injury with Fall? No - No  Risk for fall due to : History of fall(s) - History of fall(s)  Follow up Education provided;Falls prevention discussed - Education provided;Falls prevention discussed   PHQ 2/9 Scores 07/20/2017 07/12/2017  PHQ - 2 Score 0 0    THN CM Care Plan Problem One     Most Recent Value  Care Plan Problem One  Knowledge deficiet related to diagnosis of diabetes  Role Documenting the Problem One  Health [place] for Problem One  Active  [facility] Long Term Goal   Patient will lose  8 pounds in the next 90 days.  THN Long Term Goal Start Date  03/05/18  Interventions for Problem One Long Term Goal  Current care plan and goals reviewed and discussed with patient, encouraged to keep and attend medical appointments, reviewed medications and encouraged compliance, encouraged continuing daily blood sugar monitoring and recording, encouraged and congratulated on continued daily exercise, discussed healthier meal options  THN CM Short Term Goal #1   Patient will report monitoring his weight weekly in the next 90 days.  THN CM Short Term Goal #1 Start Date   03/05/18  [facility] CM Short Term Goal #1 Met Date  03/05/18  Interventions for Short Term Goal #1  Discussed monitoring weights to help keep track of goal weight loss, encouraged patient to purchase working scale to monitor weights at least weekly, encouraged patient to continue with daily walks to assist with weight loss  [facility]N CM Short Term Goal #2   Patient will verbalize 4 signs and symptoms of hypoglycemia within the next 30 days.  THN CM Short Term Goal #2 Start Date  11/29/17  [facility] CM Short Term Goal #2 Met Date  03/05/18     Appointments:  Patient attended medical appointment with Dr. Henrene Pastor on 03/04/2018 and has scheduled follow up on 07/04/2018.  Encouraged to keep and attend appointment.  Plan: RN Health Coach will make next monthly outreach to patient in the month of October. RN Health Coach will send primary care provider Quarterly Update.  [place] Coach [phone] [email or web address]

## 2018-03-28 ENCOUNTER — Encounter (HOSPITAL_COMMUNITY): Payer: Self-pay

## 2018-03-28 ENCOUNTER — Emergency Department (HOSPITAL_COMMUNITY)
Admission: EM | Admit: 2018-03-28 | Discharge: 2018-03-28 | Disposition: A | Discharge status: Home / Self Care | Payer: Medicare HMO | Attending: Emergency Medicine | Admitting: Emergency Medicine

## 2018-03-28 ENCOUNTER — Emergency Department (HOSPITAL_COMMUNITY): Payer: Medicare HMO

## 2018-03-28 DIAGNOSIS — M25561 Pain in right knee: Secondary | ICD-10-CM | POA: Diagnosis not present

## 2018-03-28 DIAGNOSIS — S8991XA Unspecified injury of right lower leg, initial encounter: Secondary | ICD-10-CM | POA: Diagnosis not present

## 2018-03-28 MED ORDER — IBUPROFEN 600 MG PO TABS: 600.0000 mg | ORAL_TABLET | Freq: Four times a day (QID) | ORAL | 0 refills | Status: DC | PRN

## 2018-03-28 MED ORDER — METHOCARBAMOL 500 MG PO TABS: 500.0000 mg | ORAL_TABLET | Freq: Two times a day (BID) | ORAL | 0 refills | Status: DC

## 2018-03-28 MED ORDER — ACETAMINOPHEN 325 MG PO TABS
650.0000 mg | ORAL_TABLET | Freq: Once | ORAL | Status: AC
  Administered 2018-03-28: 650 mg via ORAL
  Filled 2018-03-28: qty 2

## 2018-03-28 NOTE — ED Triage Notes (Signed)
Pt presents with pain and swelling to R knee/lower leg.  Pt reports getting out of his truck, stepped into a hole and fell.  Pt unable to bear weight.

## 2018-03-28 NOTE — ED Provider Notes (Signed)
[facility] EMERGENCY DEPARTMENT Provider Note   CSN: [id number] Arrival date & time: 03/28/18  1849     History   Chief Complaint Chief Complaint  Patient presents with  . Leg Pain    HPI Mark [name] is a 72 y.o. male.  72 year old male presents after sustaining injury to his right knee when he had a mechanical fall.  Felt a pop now has pain mostly on the lateral aspect of his right knee.  Denies any hip injury.  No pain to his right foot or ankle.  Some right anterior tibia pain.  Pain characterizes dull and worse with walking and better with rest.  No treatment used prior to arrival.     Past Medical History:  Diagnosis Date  . Anginal pain (HCC)   . Arthritis   . CHF (congestive heart failure) (HCC)   . Chronic kidney disease   . Coronary artery disease   . Diabetes mellitus without complication (HCC)   . DVT (deep venous thrombosis) (HCC)   . History of BPH   . Hypertension   . Myocardial infarction (HCC)   . Peripheral vascular disease [facility])     Patient Active Problem List   Diagnosis Date Noted  . Diabetes (HCC) 07/20/2017  . Essential hypertension 07/20/2017  . Acute myocardial infarction (HCC) 07/20/2017  . CAD (coronary artery disease) 07/20/2017  . Chronic diastolic heart failure (HCC) 07/20/2017  . Peripheral vascular disease (HCC) 07/20/2017  . DVT (deep venous thrombosis) (HCC) 07/20/2017  . Stage III chronic kidney disease (HCC) 07/20/2017  . History of BPH 07/20/2017  . Hyperlipidemia 07/20/2017    Past Surgical History:  Procedure Laterality Date  . HERNIA REPAIR    . INGUINAL HERNIA REPAIR Bilateral         Home Medications    Prior to Admission medications   Medication Sig Start Date End Date Taking? Authorizing Provider  aspirin 81 MG chewable tablet Chew by mouth daily.    [provider]  atorvastatin (LIPITOR) 20 MG tablet Take 20 mg by mouth daily.    [provider]  carvedilol  (COREG) 6.25 MG tablet Take 6.25 mg by mouth daily.    [provider]  furosemide (LASIX) 20 MG tablet Take 20 mg by mouth daily.    [provider]  magnesium 30 MG tablet Take 30 mg by mouth daily.    [provider]  metFORMIN (GLUCOPHAGE) 500 MG tablet Take by mouth daily with breakfast.    [provider]  nitroGLYCERIN (NITROSTAT) 0.4 MG SL tablet Place 0.4 mg under the tongue every 5 (five) minutes as needed for chest pain.    [provider]  spironolactone (ALDACTONE) 25 MG tablet Take 25 mg by mouth daily.    [provider]    Family History History reviewed. No pertinent family history.  Social History Social History   Tobacco Use  . Smoking status: Never Smoker  . Smokeless tobacco: Never Used  Substance Use Topics  . Alcohol use: No    Frequency: Never  . Drug use: No     Allergies   Penicillins   Review of Systems Review of Systems  All other systems reviewed and are negative.    Physical Exam Updated Vital Signs BP 130/70 (BP Location: Left Arm)   Pulse 64   Temp 98.5 F (36.9 C) (Oral)   Resp 14   Ht 1.702 m (5\' 7" )   Wt 91.2 kg  SpO2 100%   BMI 31.48 kg/m   Physical Exam  Constitutional: He is oriented to Clock, place, and time. He appears well-developed and well-nourished.  Non-toxic appearance. No distress.  HENT:  Head: Normocephalic and atraumatic.  Eyes: Pupils are equal, round, and reactive to light. Conjunctivae, EOM and lids are normal.  Neck: Normal range of motion. Neck supple. No tracheal deviation present. No thyroid mass present.  Cardiovascular: Normal rate, regular rhythm and normal heart sounds. Exam reveals no gallop.  No murmur heard. Pulmonary/Chest: Effort normal and breath sounds normal. No stridor. No respiratory distress. He has no decreased breath sounds. He has no wheezes. He has no rhonchi. He has no rales.  Abdominal: Soft. Normal appearance and bowel sounds  are normal. He exhibits no distension. There is no tenderness. There is no rebound and no CVA tenderness.  Musculoskeletal: Normal range of motion. He exhibits no edema.       Right knee: He exhibits no swelling, no effusion, no ecchymosis and no deformity. Tenderness found. LCL tenderness noted.       Legs: Neurological: He is alert and oriented to Bl[place], place, and time. He has normal strength. No cranial nerve deficit or sensory deficit. GCS eye subscore is 4. GCS verbal subscore is 5. GCS motor subscore is 6.  Skin: Skin is warm and dry. No abrasion and no rash noted.  Psychiatric: He has a normal mood and affect. His speech is normal and behavior is normal.  Nursing note and vitals reviewed.    ED Treatments / Results  Labs (all labs ordered are listed, but only abnormal results are displayed) Labs Reviewed - No data to display  EKG None  Radiology Dg Knee 2 Views Right  Result Date: 03/28/2018 CLINICAL DATA:  Fall EXAM: RIGHT KNEE - 1-2 VIEW COMPARISON:  None. FINDINGS: No fracture or malalignment. Moderate patellofemoral and lateral degenerative change. Mild to moderate medial degenerative change. No large knee effusion. IMPRESSION: Degenerative changes.  No acute osseous abnormality. Electronically Signed   By: Jasmine Pang M.D.   On: 03/28/2018 20:30   Dg Tibia/fibula Right  Result Date: 03/28/2018 CLINICAL DATA:  Fall EXAM: RIGHT TIBIA AND FIBULA - 2 VIEW COMPARISON:  None. FINDINGS: No acute displaced fracture or malalignment. Soft tissues are unremarkable. IMPRESSION: No acute osseous abnormality. Electronically Signed   By: Jasmine Pang M.D.   On: 03/28/2018 20:31    Procedures Procedures (including critical care time)  Medications Ordered in ED Medications - No data to display   Initial Impression / Assessment and Plan / ED Course  I have reviewed the triage vital signs and the nursing notes.  Pertinent labs & imaging results that were available during my care  of the patient were reviewed by me and considered in my medical decision making (see chart for details).     Patient with negative x-rays of his right knee and right lower extremity.  Does have pain at the right LCL and suspect ligamentous injury.  Will place an a knee immobilizer as well as crutches and give Ortho referral.  Final Clinical Impressions(s) / ED Diagnoses   Final diagnoses:  None    ED Discharge Orders    None       Lorre Nick, MD 03/28/18 2110

## 2018-03-28 NOTE — ED Notes (Signed)
Knee immobilizer applied, and crutches given to patient, patient instructed on the use of crutches and immobilizer, discharge instructions reviewed, patient verbalized understanding of all provided materials with no further questions. Pt wheeled in wheelchair to POV for transportation home.

## 2018-04-08 ENCOUNTER — Other Ambulatory Visit: Payer: Self-pay | Admitting: *Deleted

## 2018-04-08 NOTE — Patient Outreach (Signed)
[facility]) Care Management  04/08/2018  Mark [name] 1946/05/20 [id number]   RN Health Coach Post ED Follow Up  Referral Date:07/12/2017 Referral Source:MD Office (Dr. Marina Goodell) Reason for Referral:Diabetes Education and Support Insurance:[employer] Medicare   Outreach Attempt:  Successful telephone outreach to patient for follow up emergency room visit.  HIPAA verified with patient.  Patient reporting he fell of a ladder on 03/28/2018 and hurt his knee. Reports he went to the emergency room, prescribed pain medication, placed on crutches, knee placed in an immobilizer and was told to follow up with an Orthopedist.  Patient reporting pain is better.  Continues to have knee in immobilizer and using crutches for ambulation.  States he is unsure how long he is suppose to use both.  Patient also reporting he has attempted to make appointment with orthopedist and has been told they will call him back, but he is unsure if he will have the money for the consultation co pay.  Instructed patient to contact primary care provider for follow up appointment as soon as possible since it has been about 2 weeks since injury.  Patient stating he will contact primary care provider, Dr. Marina Goodell for appointment.    Plan:  RN Health Coach will make next quarterly outreach in the month of October.  [name] RN [facility] Care Management  RN Health Coach [phone] [name]

## 2018-05-05 DIAGNOSIS — I10 Essential (primary) hypertension: Secondary | ICD-10-CM | POA: Diagnosis not present

## 2018-05-05 DIAGNOSIS — Z79899 Other long term (current) drug therapy: Secondary | ICD-10-CM | POA: Diagnosis not present

## 2018-05-05 DIAGNOSIS — M542 Cervicalgia: Secondary | ICD-10-CM | POA: Diagnosis not present

## 2018-05-05 DIAGNOSIS — Z7982 Long term (current) use of aspirin: Secondary | ICD-10-CM | POA: Diagnosis not present

## 2018-05-05 DIAGNOSIS — M47812 Spondylosis without myelopathy or radiculopathy, cervical region: Secondary | ICD-10-CM | POA: Diagnosis not present

## 2018-05-05 DIAGNOSIS — I252 Old myocardial infarction: Secondary | ICD-10-CM | POA: Diagnosis not present

## 2018-05-05 DIAGNOSIS — E7800 Pure hypercholesterolemia, unspecified: Secondary | ICD-10-CM | POA: Diagnosis not present

## 2018-05-05 DIAGNOSIS — S161XXA Strain of muscle, fascia and tendon at neck level, initial encounter: Secondary | ICD-10-CM | POA: Diagnosis not present

## 2018-05-13 DIAGNOSIS — M542 Cervicalgia: Secondary | ICD-10-CM | POA: Diagnosis not present

## 2018-05-13 DIAGNOSIS — L255 Unspecified contact dermatitis due to plants, except food: Secondary | ICD-10-CM | POA: Diagnosis not present

## 2018-05-16 ENCOUNTER — Ambulatory Visit: Payer: Medicare HMO | Admitting: *Deleted

## 2018-05-17 ENCOUNTER — Encounter: Payer: Self-pay | Admitting: *Deleted

## 2018-05-17 ENCOUNTER — Other Ambulatory Visit: Payer: Self-pay | Admitting: *Deleted

## 2018-05-17 NOTE — Patient Outreach (Addendum)
[facility]) Care Management  [facility] Care Manager  05/17/2018   Mark [name] Mar 11, 1946 [id number]   RN Health Coach Quarterly Outreach   Referral Date:  07/12/2017 Referral Source:  MD Office (Dr. Marina Goodell) Reason for Referral:  Diabetes Education and Support Insurance:  [employer]   Outreach Attempt:  Successful telephone outreach to patient for quarterly follow up.  HIPAA verified with patient.  Patient reporting he is doing some better.  Endorses emergency room visit on 05/05/2018 for neck pain.  States he has seen his primary care provider since and was prescribed a medication (he is unsure of the name) and pain is better.  Patient reporting knee pain from fall in August is a lot better and he is able to ambulate without crutches.  Continues to monitor CBG's daily.  Has not checked blood sugar for today but reports fasting ranges of 95-135.  Last Hgb A1C is 6.0 in KPN system.  Patient reporting he is needing assistance applying for Medicaid due to his recent emergency room visits.  Patient also stating his last visit to the pharmacy he ended up paying over $100 for 4 prescriptions.  Requesting assistance with medication co pays.  Stating he cannot afford his 90 day supply of nitroglycerin.  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  [facility] and [facility] Social Work referrals discussed and patient verbally agrees.  Encounter Medications:  Outpatient Encounter Medications as of 05/17/2018  Medication Sig Note  . aspirin 81 MG chewable tablet Chew by mouth daily.   Marland Kitchen atorvastatin (LIPITOR) 20 MG tablet Take 20 mg by mouth daily.   . carvedilol (COREG) 6.25 MG tablet Take 6.25 mg by mouth daily.   Marland Kitchen ibuprofen (ADVIL,MOTRIN) 600 MG tablet Take 1 tablet (600 mg total) by mouth every 6 (six) hours as needed.   .  magnesium 30 MG tablet Take 30 mg by mouth daily.   . metFORMIN (GLUCOPHAGE) 500 MG tablet Take by mouth daily with breakfast.   . nitroGLYCERIN (NITROSTAT) 0.4 MG SL tablet Place 0.4 mg under the tongue every 5 (five) minutes as needed for chest pain.   . furosemide (LASIX) 20 MG tablet Take 20 mg by mouth daily. 05/17/2018: Reports medication was stopped  . methocarbamol (ROBAXIN) 500 MG tablet Take 1 tablet (500 mg total) by mouth 2 (two) times daily. (Patient not taking: Reported on 05/17/2018) 05/17/2018: Reports no longer taking  . spironolactone (ALDACTONE) 25 MG tablet Take 25 mg by mouth daily. 05/17/2018: Reports medication was stopped   No facility-administered encounter medications on file as of 05/17/2018.     Functional Status:  In your present state of health, do you have any difficulty performing the following activities: 05/17/2018 07/20/2017  Hearing? N N  Vision? N N  Difficulty concentrating or making decisions? N N  Walking or climbing stairs? N N  Dressing or bathing? N N  Doing errands, shopping? N N  Preparing Food and eating ? N N  Using the Toilet? N N  In the past six months, have you accidently leaked urine? N N  Do you have problems with loss of bowel control? N N  Managing your Medications? N N  Managing your Finances? N N  Housekeeping or managing your Housekeeping? N N  Some recent data might be hidden  Fall/Depression Screening: Fall Risk  05/17/2018 04/08/2018 03/05/2018  Falls in the past year? Yes Yes No  Comment no falls since August 2019 - denies any recent falls  Number falls in past yr: 2 or more 2 or more 1  Injury with Fall? Yes Yes No  Comment - knee hurt needs to see orthopedic physician -  Risk Factor Category  High Fall Risk - -  Risk for fall due to : Medication side effect;History of fall(s) - History of fall(s)  Follow up Falls prevention discussed;Education provided Falls prevention discussed;Education provided Education provided;Falls  prevention discussed   PHQ 2/9 Scores 05/17/2018 07/20/2017 07/12/2017  PHQ - 2 Score 0 0 0    THN CM Care Plan Problem One     Most Recent Value  Care Plan Problem One  Knowledge deficiet related to diagnosis of diabetes  Role Documenting the Problem One  Health Coach  Care Plan for Problem One  Active  [facility]N Long Term Goal   Patient will lose 3 pounds in the next 90 days.  THN Long Term Goal Start Date  05/17/18  Interventions for Problem One Long Term Goal  Reviewed and discussed current care plan and goals, encouraged continued daily walking and exercise as tolerated with neck pain, encouraged patient to weigh about weekly or everyother week to keep track of weight loss goal,   THN CM Short Term Goal #1   Patient will report no emergency room visits in the next 90 days.  THN CM Short Term Goal #1 Start Date  05/17/18  Interventions for Short Term Goal #1  Sending Know Before you Go Brochure, educated patient on when to go to primary care provider versus urgent care versus emergency room, discussed with patient emergency room visits are for true emergencies like heart attack or stroke, encouraged patient to keep and attend scheduled medical appointments, [facility] Social Work referral placed to assist with medicaid application, [facility] referral placed to assist with medication affordability  THN CM Short Term Goal #2   Patient will be able to verbalize the meaning of Hgb A1C in the next 90 days.  THN CM Short Term Goal #2 Start Date  05/17/18  Interventions for Short Term Goal #2  Educated patient on what Hgb A1C is and encouraged patient to continue to review Diabetes education booklets, encouraged patient to discuss goal A1C with primary care provider, discussed with patient current Hgb A1C of 6 in KPN system and congratulated him on current status, encouraged continued daily monitoring of blood sugars, reviewed medications and encouraged medication compliance     Appointments:  Patient reports  seeing Dr. Marina Goodell on 05/15/2018 and has scheduled follow up appointment on 06/11/2018.  Plan: RN Health Coach will send [facility] SW referral for possible assistance with community resources related to [facility] Application. RN Health Coach will send [facility] pharmacy referral for medication assistance. RN Health Coach will send Know Before You Go Sheet. RN Health Coach will send primary care provider Quarterly Update. RN Health Coach will make next telephone outreach to patient in the month of January.  [name] RN [facility] Care Management  RN Health Coach [phone] [name]

## 2018-05-21 ENCOUNTER — Telehealth: Payer: Self-pay | Admitting: Pharmacist

## 2018-05-21 NOTE — Telephone Encounter (Signed)
----- Message from Carles Collet Comer sent at 05/17/2018  3:54 PM EDT ----- Regarding: Referral: Order for [name]  Referral from [name], RN  "Please see below request"  Elza Rafter ----- Message ----- From: Maple Mirza, RN Sent: 05/17/2018   2:12 PM EDT To: Thn Cm Communication Orders Subject: Order for [facility]                     Patient Name: Mark [name], Mark [name]([id number]) Sex: Male DOB: 06/28/1946    PCP: Abigail Miyamoto   Center: None   Types of orders made on 05/17/2018: Nursing  Order Date:05/17/2018 Ordering Rudi Coco D [[id number]] Encounter Provider:Maple Mirza, RN [[id number]] Authorizing Provider:  Abigail Miyamoto, MD [[id number]] Department:[facility]COMM[facility][[id number]]  Order Specific Information Order: Comm to Pharmacy [Custom: [id number]]  Order #: [id number] Qty: 1   Priority: Routine  Class: Clinic Performed   Comment:[facility] Pharmacy Referral            Referral from:  RN Health Coach                        Medication assistance. Patient unable to afford meds  States he is             unable to  afford his NTG copay from [facility] order and just             filled 4 prescriptions (unsure of which ones because he is not at             home to look at his medications) and co pay was over $100.  Asked if             he was in the donut hole and patient stated he was not sure was just             told [employer] was not covering the medications                        Please see RN Health Coach note for further details.     Reason for Co nsult -> Medication Assistance       Priority: Routine  Class: Clinic Performed   Comment:[facility] Pharmacy Referral            Referral from:  RN Health Coach                        Medication assistance. Patient unable to afford meds  States he is             unable to afford his NTG copay from [facility] order and just             filled 4 prescriptions (unsure of which ones  because he is not at             home to loo k at his medications) and co pay was over $100.  Asked if             he was in the donut hole and patient stated he was not sure was just             told [employer] was not covering the medications                        Please see RN Health Coach note for further details.  Reason for Consult -> Medication Assistance

## 2018-05-23 ENCOUNTER — Other Ambulatory Visit: Payer: Self-pay

## 2018-05-23 NOTE — Patient Outreach (Signed)
[facility]) Care Management  05/23/2018  Mark [name] [birth year] [id number]  Successful outreach to the patient on today's date, HIPAA identifiers confirmed. BSW introduced self to the patient and the reason for today's call, indicating the patient had been referred to this BSW for assistance in completing a Medicaid application. The patient reports he has already applied for Medicaid and was recently turned down. The patient reports a monthly income above the Medicaid eligibility limit.   BSW asked the patient what his goals was when applying for Medicaid. The patient reports concerns with co-pays especially when seen in the emergency room. The patient reports he has to pay around $150 each time he visits the emergency room. The patient reports he has been to the ED twice recently, once in [place] and once in [place]. BSW spoke with the patient about arranging a payment plan with the financial offices of each location. The patient reports he has already paid the ED bill in [place] and plans to pay the other "next month".  BSW to perform a discipline closure. The patient is agreeable to this and is aware he will remain active with [facility] RN Health Coach and Pharmacy. BSW to update members of the patients care team of discipline closure.  Bevelyn Ngo, BSW, CDP [facility] [phone]

## 2018-05-27 ENCOUNTER — Other Ambulatory Visit: Payer: Self-pay | Admitting: Pharmacist

## 2018-05-27 NOTE — Patient Outreach (Signed)
[facility]) Care Management  05/27/2018  Monterrius Cardosa Bobrowski 1946-03-04 [id number]   [facility]) Care Management  [facility] CM Pharmacy   05/27/2018  Rolla Kedzierski Cheslock Jul 02, 1946 [id number]  Reason for referral: Medication Assistance   Referral source: Health Coach Referral medication(s): Nitroglycerin, Diclofenac Gel Current insurance:[employer] Currently receiving Extra Help:  []  Yes [x]  No []  Unknown  HPI:  PVD, Hypertension, history of DVT, CAD, type 2 diabetes, CKD stage III, hyperlipidemia, history of BPH  Objective:  Allergies  Allergen Reactions  . Penicillins Rash    Can take by mouth, can not take injections will break out in a rash    Medications Reviewed Today    Reviewed by Beecher Mcardle, [facility] (Pharmacist) on 05/27/18 at 0914  Med List Status: <None>  Medication Order Taking? Sig Documenting Provider Last Dose Status Informant  ACCU-CHEK AVIVA PLUS test strip [id number] Yes TEST BLOOD SUGAR QD [provider] Taking Active   ACCU-CHEK SOFTCLIX LANCETS lancets [id number] Yes USE TO CHECK BLOOD SUGAR ONCE A DAY [provider] Taking Active   aspirin 81 MG chewable tablet [id number] Yes Chew by mouth daily. [provider] Taking Active Self  atorvastatin (LIPITOR) 20 MG tablet [id number] Yes Take 20 mg by mouth daily. [provider] Taking Active Self  carvedilol (COREG) 6.25 MG tablet [id number] Yes Take 6.25 mg by mouth daily. [provider] Taking Active Self  cyclobenzaprine (FLEXERIL) 10 MG tablet [id number] Yes Take 10 mg by mouth 2 (two) times daily. [provider] Taking Active   diclofenac sodium (VOLTAREN) 1 % GEL [id number] Yes Apply 1 application topically 4 (four) times daily. Neck [provider] Taking Active   furosemide (LASIX) 20 MG tablet [id number] Yes Take 20 mg by mouth daily. [provider] Taking Active Self           Med Note Gretchen Short [facility] D    Fri May 17, 2018  9:51 AM) Reports medication was stopped  ibuprofen (ADVIL,MOTRIN) 600 MG tablet [id number] No Take 1 tablet (600 mg total) by mouth every 6 (six) hours as needed.  Patient not taking:  Reported on 05/27/2018   Lorre Nick, MD Not Taking Active   magnesium 30 MG tablet [id number] Yes Take 30 mg by mouth daily. [provider] Taking Active Self  metFORMIN (GLUCOPHAGE) 500 MG tablet [id number] Yes Take by mouth daily with breakfast. [provider] Taking Active Self  nitroGLYCERIN (NITROSTAT) 0.4 MG SL tablet [id number] Yes Place 0.4 mg under the tongue every 5 (five) minutes as needed for chest pain. [provider] Taking Active Self  spironolactone (ALDACTONE) 25 MG tablet [id number] No Take 25 mg by mouth daily. [provider] Not Taking Active Self           Med Note Maple Mirza   Fri May 17, 2018  9:51 AM) Reports medication was stopped  triamcinolone cream (KENALOG) 0.1 % [id number] Yes APPLY A THIN LAYER TO AFFECTED AREAS 2 TIMES DAILY [provider] Taking Active           Assessment:  Drugs sorted by system:  Cardiovascular: Aspirin, Atorvastatin, Carvedilol, furosemide, Nitroglycerin, Spironolactone (not taking)  Endocrine: Metformin,  Topical: Diclofenac Gel, Triamcinolone cream  Pain: Cyclobenzaprine, Ibuprofen (reported not taking),   Vitamins/Minerals/Supplements: Magnesium,    Medication Assistance Findings:  Extra Help:   []  Already receiving Full Extra Help  []  Already receiving Partial Extra Help  []  Eligible based on reported income and assets  [  x] Not Eligible based on reported income and assets  Patient Assistance Programs:-- none as all meds in question are generic  Additional medication assistance options reviewed with patient as warranted:   Additional mediction assistance options: International aid/development worker was called and the  following prices were quoted for the patient's medications:  Triamcinolone cream - $7.11 Cyclobenzaprine - 12.00 Diclofenac gel ( 4 tubes)-$44--although generic, diclofenac is a tier 3 medication so this is the lowest the copay can be.   Nitroglycerin tablets --$33 for a 90 day supply  Patient was set up with [facility]- Aspirin (3 bottles) Magnesium (2 bottles). Patient has a $50 OTC benefit per quarter.  Plan: Follow up with patient in 2 weeks to see if he received his OTC products and to see if he is interested in [facility]. Route note to PCP.  Beecher Mcardle, PharmD, BCACP [facility] Clinical Pharmacist [phone]

## 2018-05-27 NOTE — Patient Outreach (Signed)
[facility]) Care Management  05/27/2018  Tyra Gaither Stene 12-26-1945 [id number]   Patient was called on 05/21/18 regarding medication assistance. Unfortunately, he did not answer the phone. HIPAA compliant message was left on his voicemail.  Plan: Patient was sent an unsuccessful contact letter on 05/21/18 Will call patient back in 1-2 business days.   Beecher Mcardle, PharmD, BCACP [facility] Clinical Pharmacist [phone]

## 2018-06-11 ENCOUNTER — Other Ambulatory Visit: Payer: Self-pay | Admitting: Pharmacist

## 2018-06-11 ENCOUNTER — Ambulatory Visit: Payer: Self-pay | Admitting: Pharmacist

## 2018-06-11 NOTE — Patient Outreach (Signed)
[facility]) Care Management  06/11/2018  Mark [name] [birth year] [id number]   Patient was called to follow up on getting his OTC order from [facility].  HIPAA identifiers were obtained. Patient said he had not checked his mailbox in the last few days. An order was place on his behalf with [facility] OTC Program on 05/27/18. Patient is on all generic medications which do not have a patient assistance program that provides medications for less than what the patient is paying with his insurance.  Plan: Follow up with the patient in 7-10 business days.  Beecher Mcardle, PharmD, BCACP [facility] Clinical Pharmacist [phone]

## 2018-06-19 ENCOUNTER — Other Ambulatory Visit: Payer: Self-pay | Admitting: Pharmacist

## 2018-06-19 NOTE — Patient Outreach (Signed)
[facility]) Care Management  06/19/2018  Charl Mengesha Omara 08/17/1945 [id number]   Patient was called to follow up on receipt of his OTC order from [facility]. HIPAA identifiers were obtained. Patient confirmed he received the OTC medication order from [facility].    Plan: Close patient's case as he was referred for medication assistance and he is taking all generic medications that do not have programs for the patient to receive the medications for cheaper than he is already paying.  Close patient's case. Alert other [facility] Staff involved in the patient's care.   Beecher Mcardle, PharmD, BCACP [facility] Clinical Pharmacist [phone]

## 2018-07-05 DIAGNOSIS — E782 Mixed hyperlipidemia: Secondary | ICD-10-CM | POA: Diagnosis not present

## 2018-07-05 DIAGNOSIS — N183 Chronic kidney disease, stage 3 (moderate): Secondary | ICD-10-CM | POA: Diagnosis not present

## 2018-07-05 DIAGNOSIS — E1121 Type 2 diabetes mellitus with diabetic nephropathy: Secondary | ICD-10-CM | POA: Diagnosis not present

## 2018-07-05 DIAGNOSIS — I2510 Atherosclerotic heart disease of native coronary artery without angina pectoris: Secondary | ICD-10-CM | POA: Diagnosis not present

## 2018-07-05 DIAGNOSIS — M25561 Pain in right knee: Secondary | ICD-10-CM | POA: Diagnosis not present

## 2018-07-05 DIAGNOSIS — Z6832 Body mass index (BMI) 32.0-32.9, adult: Secondary | ICD-10-CM | POA: Diagnosis not present

## 2018-07-05 DIAGNOSIS — Z23 Encounter for immunization: Secondary | ICD-10-CM | POA: Diagnosis not present

## 2018-07-05 DIAGNOSIS — E1159 Type 2 diabetes mellitus with other circulatory complications: Secondary | ICD-10-CM | POA: Diagnosis not present

## 2018-07-05 DIAGNOSIS — I10 Essential (primary) hypertension: Secondary | ICD-10-CM | POA: Diagnosis not present

## 2018-08-16 ENCOUNTER — Other Ambulatory Visit: Payer: Self-pay | Admitting: *Deleted

## 2018-08-16 ENCOUNTER — Encounter: Payer: Self-pay | Admitting: *Deleted

## 2018-08-16 NOTE — Patient Outreach (Signed)
[facility]) Care Management  [facility] Care Manager  08/16/2018   Mark [name] April 17, 1946 [id number]   [place] Quarterly Outreach   Referral Date:  07/12/2017 Referral Source:  MD Office (Dr. Henrene Pastor) Reason for Referral:  Diabetes Education and Support Insurance:  [facility]   Outreach Attempt:  Successful telephone outreach to patient for quarterly follow up.  HIPAA verified with patient.  Patient stating he is doing well.  Denies any recent falls since August.  Continues to monitor blood sugars daily.  Fasting blood sugar this morning was 102 with fasting ranges of 90-120's.  Denies any hypo or hyperglycemic events.  Latest Hgb A1C is 6.2 on 07/05/2018.  Encounter Medications:  Outpatient Encounter Medications as of 08/16/2018  Medication Sig Note  . ACCU-CHEK AVIVA PLUS test strip TEST BLOOD SUGAR QD   . ACCU-CHEK SOFTCLIX LANCETS lancets USE TO CHECK BLOOD SUGAR ONCE A DAY   . aspirin 81 MG chewable tablet Chew by mouth daily.   Marland Kitchen atorvastatin (LIPITOR) 20 MG tablet Take 20 mg by mouth daily.   . carvedilol (COREG) 6.25 MG tablet Take 6.25 mg by mouth daily.   . cyclobenzaprine (FLEXERIL) 10 MG tablet Take 10 mg by mouth 2 (two) times daily. 08/16/2018: Taking 1 pill every other day  . furosemide (LASIX) 20 MG tablet Take 20 mg by mouth daily.   . Magnesium 400 MG TABS Take 1 tablet by mouth daily.   . metFORMIN (GLUCOPHAGE) 500 MG tablet Take by mouth daily with breakfast.   . nitroGLYCERIN (NITROSTAT) 0.4 MG SL tablet Place 0.4 mg under the tongue every 5 (five) minutes as needed for chest pain.   Marland Kitchen spironolactone (ALDACTONE) 25 MG tablet Take 25 mg by mouth daily. 05/17/2018: Reports medication was stopped  . triamcinolone cream (KENALOG) 0.1 % APPLY A THIN LAYER TO AFFECTED AREAS 2 TIMES DAILY   . ibuprofen (ADVIL,MOTRIN) 600 MG tablet Take 1 tablet (600 mg total) by mouth every 6 (six) hours as needed. (Patient not taking: Reported on 05/27/2018)     No facility-administered encounter medications on file as of 08/16/2018.     Functional Status:  In your present state of health, do you have any difficulty performing the following activities: 05/17/2018  Hearing? N  Vision? N  Difficulty concentrating or making decisions? N  Walking or climbing stairs? N  Dressing or bathing? N  Doing errands, shopping? N  Preparing Food and eating ? N  Using the Toilet? N  In the past six months, have you accidently leaked urine? N  Do you have problems with loss of bowel control? N  Managing your Medications? N  Managing your Finances? N  Housekeeping or managing your Housekeeping? N  Some recent data might be hidden    Fall/Depression Screening: Fall Risk  08/16/2018 05/17/2018 04/08/2018  Falls in the past year? 1 Yes Yes  Comment last fall in August 2019 no falls since August 2019 -  Number falls in past yr: 1 2 or more 2 or more  Injury with Fall? 1 Yes Yes  Comment - - knee hurt needs to see orthopedic physician  Risk Factor Category  High Risk (2 or more Points) High Fall Risk -  Risk for fall due to : History of fall(s);Medication side effect;Impaired balance/gait Medication side effect;History of fall(s) -  Follow up Falls prevention discussed;Falls evaluation completed;Education provided Falls prevention discussed;Education provided Falls prevention discussed;Education provided   [facility] 2/9 Scores 05/17/2018 07/20/2017 07/12/2017  PHQ -  2 Score 0 0 0   [facility] CM Care Plan Problem One     Most Recent Value  Care Plan Problem One  Knowledge deficiet related to diagnosis of diabetes  Role Documenting the Problem One  Health Coach  Care Plan for Problem One  Active  THN Long Term Goal   Patient will lose 3 pounds in the next 90 days.  THN Long Term Goal Start Date  08/16/18  Interventions for Problem One Long Term Goal  Encouraged patient to continue to walk for exercise and to utilize the gym for cold rainy weather, congratulated patient on  obtaining scale for the home to help monitor weights and weight loss  [facility] CM Short Term Goal #1   Patient will maintain Hgb A1C of 6.5 or below in the next 90 days.  [facility] CM Short Term Goal #1 Start Date  08/16/18  [facility] CM Short Term Goal #1 Met Date  08/16/18  Interventions for Short Term Goal #1  Congratulated patient on current A1C level of 6.2 on 07/05/2018, encouragved patient to continue to review diabetic educational material, encouraged healthier meal and drink options, reviewed medications and encouraged medication compliance, encouraged patient to keep and attend medical appointments  [facility]N CM Short Term Goal #2   Patient will be able to verbalize the meaning of Hgb A1C in the next 90 days.  [facility]N CM Short Term Goal #2 Start Date  08/16/18  Interventions for Short Term Goal #2  Reviewed meaning of Hgb A1C with patient, discussed importance of monitoring A1C, reviewed patient's current A1C, discussed signs and symptoms of hypo and hyperlgycemia      Appointments: Last attended appointment with Dr. Henrene Pastor, primary care provider on 07/05/2018 and will have follow up in April 2020.  Plan: RN Health Coach will send primary care provider quarterly update. RN Health Coach will make next telephone outreach to patient within the month of April.  [place] [phone] [email or web address]

## 2018-11-14 ENCOUNTER — Other Ambulatory Visit: Payer: Self-pay | Admitting: *Deleted

## 2018-11-14 ENCOUNTER — Encounter: Payer: Self-pay | Admitting: *Deleted

## 2018-11-14 ENCOUNTER — Other Ambulatory Visit: Payer: Self-pay

## 2018-11-14 NOTE — Patient Outreach (Signed)
Mark [name]) Care Management  [name] Care Manager  11/14/2018   Mark [name] February 18, 1946 [id number]   [place] Quarterly Outreach   Referral Date:  07/12/2017 Referral Source:  MD Office (Dr. Henrene [name]) Reason for Referral:  Diabetes Education and Support Insurance:  [facility] Medicare   Outreach Attempt:  Successful telephone outreach to patient quarterly follow up.  HIPAA verified with patient.  Patient reporting he is recovering from a head cold.  Has been taking over the counter medications.  Cough is getting better.  Denies any shortness of breath or fever.  Reports continued daily monitoring of blood sugars with fasting ranges of 100-110's.  Denies any hypo or hyperglycemic episodes.  Discussed and encouraged social distancing and sanitizing versus cleaning with patient, especially since he reports continued gym use.  Encounter Medications:  Outpatient Encounter Medications as of 11/14/2018  Medication Sig Note  . ACCU-CHEK AVIVA PLUS test strip TEST BLOOD SUGAR QD   . ACCU-CHEK SOFTCLIX LANCETS lancets USE TO CHECK BLOOD SUGAR ONCE A DAY   . aspirin 81 MG chewable tablet Chew by mouth daily.   Marland Kitchen atorvastatin (LIPITOR) 20 MG tablet Take 20 mg by mouth daily.   . carvedilol (COREG) 6.25 MG tablet Take 6.25 mg by mouth daily.   . cyclobenzaprine (FLEXERIL) 10 MG tablet Take 10 mg by mouth 2 (two) times daily. 08/16/2018: Taking 1 pill every other day  . furosemide (LASIX) 20 MG tablet Take 20 mg by mouth daily.   . Magnesium 400 MG TABS Take 1 tablet by mouth daily.   . metFORMIN (GLUCOPHAGE) 500 MG tablet Take by mouth daily with breakfast.   . nitroGLYCERIN (NITROSTAT) 0.4 MG SL tablet Place 0.4 mg under the tongue every 5 (five) minutes as needed for chest pain.   Marland Kitchen triamcinolone cream (KENALOG) 0.1 % APPLY A THIN LAYER TO AFFECTED AREAS 2 TIMES DAILY   . ibuprofen (ADVIL,MOTRIN) 600 MG tablet Take 1 tablet (600 mg total) by mouth every 6 (six) hours as needed.  (Patient not taking: Reported on 05/27/2018)   . spironolactone (ALDACTONE) 25 MG tablet Take 25 mg by mouth daily. 05/17/2018: Reports medication was stopped   No facility-administered encounter medications on file as of 11/14/2018.     Functional Status:  In your present state of health, do you have any difficulty performing the following activities: 05/17/2018  Hearing? N  Vision? N  Difficulty concentrating or making decisions? N  Walking or climbing stairs? N  Dressing or bathing? N  Doing errands, shopping? N  Preparing Food and eating ? N  Using the Toilet? N  In the past six months, have you accidently leaked urine? N  Do you have problems with loss of bowel control? N  Managing your Medications? N  Managing your Finances? N  Housekeeping or managing your Housekeeping? N  Some recent data might be hidden    Fall/Depression Screening: Fall Risk  11/14/2018 08/16/2018 05/17/2018  Falls in the past year? 1 1 Yes  Comment Last Fall August 2019 last fall in August 2019 no falls since August 2019  Number falls in past yr: _0 or more  Injury with Fall? 1 1 Yes  Comment - - -  Risk Factor Category  High Risk (2 or more Points) High Risk (2 or more Points) High Fall Risk  Risk for fall due to : History of fall(s);Impaired balance/gait;Medication side effect History of fall(s);Medication side effect;Impaired balance/gait Medication side effect;History of fall(s)  Follow  up Education provided;Falls evaluation completed;Falls prevention discussed Falls prevention discussed;Falls evaluation completed;Education provided Falls prevention discussed;Education provided   PHQ 2/9 Scores 05/17/2018 07/20/2017 07/12/2017  PHQ - 2 Score 0 0 0   THN CM Care Plan Problem One     Most Recent Value  Care Plan Problem One  Knowledge deficiet related to diagnosis of diabetes  Role Documenting the Problem One  Health Coach  Care Plan for Problem One  Active  THN Long Term Goal   Patient will lose 3  pounds in the next 90 days.  THN Long Term Goal Start Date  11/14/18  THN Long Term Goal Met Date  11/14/18  Interventions for Problem One Long Term Goal  Congratulated patient on continued weight loss goals and continued dedication to exercising, encouraged to continue to exercise daily in gym or walking, discussed social distancing and maintaining sanitizing versus cleaning while at the gym, encouraged continued diabetic diet  [facility]N CM Short Term Goal #1   Patient will maintain Hgb A1C of 6.5 or below in the next 90 days.  [facility]N CM Short Term Goal #1 Start Date  11/14/18  Interventions for Short Term Goal #1  discussed meaning of A1C with patient, encouraged to keep and attend scheduled medical appointment, reviewed medications and encouraged medication compliance, encouraged to continue to review diabetes educational material  THN CM Short Term Goal #2   Patient will be able to verbalize the meaning of Hgb A1C in the next 90 days.  THN CM Short Term Goal #2 Start Date  08/16/18  [facility] CM Short Term Goal #2 Met Date  11/14/18     Appointments:  Reports he has scheduled follow up appointment with Dr. Perry on 12/10/2018.  Encouraged to keep and attend appointment.  Plan: RN Health Coach will send primary care provider quarterly update. RN Health Coach will make next telephone outreach to patient within the month of July.  Mark Tarpley RN [facility] Care Management  RN Health Coach [phone] [name]     

## 2018-12-10 DIAGNOSIS — Z111 Encounter for screening for respiratory tuberculosis: Secondary | ICD-10-CM | POA: Diagnosis not present

## 2018-12-12 DIAGNOSIS — Z111 Encounter for screening for respiratory tuberculosis: Secondary | ICD-10-CM | POA: Diagnosis not present

## 2018-12-24 DIAGNOSIS — Z0000 Encounter for general adult medical examination without abnormal findings: Secondary | ICD-10-CM | POA: Diagnosis not present

## 2019-01-08 DIAGNOSIS — E1121 Type 2 diabetes mellitus with diabetic nephropathy: Secondary | ICD-10-CM | POA: Diagnosis not present

## 2019-01-08 DIAGNOSIS — I10 Essential (primary) hypertension: Secondary | ICD-10-CM | POA: Diagnosis not present

## 2019-01-08 DIAGNOSIS — I2510 Atherosclerotic heart disease of native coronary artery without angina pectoris: Secondary | ICD-10-CM | POA: Diagnosis not present

## 2019-01-08 DIAGNOSIS — E1159 Type 2 diabetes mellitus with other circulatory complications: Secondary | ICD-10-CM | POA: Diagnosis not present

## 2019-01-08 DIAGNOSIS — I5032 Chronic diastolic (congestive) heart failure: Secondary | ICD-10-CM | POA: Diagnosis not present

## 2019-01-08 DIAGNOSIS — Z1159 Encounter for screening for other viral diseases: Secondary | ICD-10-CM | POA: Diagnosis not present

## 2019-01-14 DIAGNOSIS — E1159 Type 2 diabetes mellitus with other circulatory complications: Secondary | ICD-10-CM | POA: Diagnosis not present

## 2019-01-14 DIAGNOSIS — I2510 Atherosclerotic heart disease of native coronary artery without angina pectoris: Secondary | ICD-10-CM | POA: Diagnosis not present

## 2019-01-14 DIAGNOSIS — I10 Essential (primary) hypertension: Secondary | ICD-10-CM | POA: Diagnosis not present

## 2019-02-12 ENCOUNTER — Encounter: Payer: Self-pay | Admitting: *Deleted

## 2019-02-12 ENCOUNTER — Other Ambulatory Visit: Payer: Self-pay | Admitting: *Deleted

## 2019-02-12 NOTE — Patient Outreach (Signed)
Mark [name]) Care Management  [facility] Care Manager  02/12/2019   Mark [name] 29-May-1946 [id number]   Mark [name]   Referral Date:  07/12/2017 Referral Source:  MD Office (Dr. Henrene [name]) Reason for Referral:  Diabetes Education and Support Insurance:  [facility]   [name] Attempt:  Successful telephone [name] to patient for follow up.  HIPAA verified with patient.  Patient reporting his blood sugars are well controlled.  Fasting blood sugars have been ranging 100's, this mornings was 112.  Last Hgb A1C was 6 on 01/14/2019.  Patient is reporting he is feeling a little depressed related to his living situation.  States he has been living in a hotel for the last 3 months in [place].  Where he was previously living, the landlord (his friend) passed away.  Reports he has applied for some housing but they will not allow his girlfriend to move in with him.  States he needs her because she assist with his care.  Discussed [place] [facility] referral for housing resources and patient verbally agrees.  Encounter Medications:  Outpatient Encounter Medications as of 02/12/2019  Medication Sig Note  . ACCU-CHEK AVIVA PLUS test strip TEST BLOOD SUGAR QD   . ACCU-CHEK SOFTCLIX LANCETS lancets USE TO CHECK BLOOD SUGAR ONCE A DAY   . aspirin 81 MG chewable tablet Chew by mouth daily.   Marland Kitchen atorvastatin (LIPITOR) 20 MG tablet Take 20 mg by mouth daily.   . carvedilol (COREG) 6.25 MG tablet Take 6.25 mg by mouth daily.   . diclofenac sodium (VOLTAREN) 1 % GEL Apply 2 g topically 4 (four) times daily.   . furosemide (LASIX) 20 MG tablet Take 20 mg by mouth daily. 02/12/2019: Reports taking 1 tablet every other day per providers instructions  . Magnesium 400 MG TABS Take 1 tablet by mouth daily.   . metFORMIN (GLUCOPHAGE) 500 MG tablet Take by mouth daily with breakfast.   . nitroGLYCERIN (NITROSTAT) 0.4 MG SL tablet Place 0.4 mg under the tongue every 5 (five) minutes  as needed for chest pain.   Marland Kitchen spironolactone (ALDACTONE) 25 MG tablet Take 25 mg by mouth daily. 02/12/2019: Reports taking 1 tablet every other day per providers instructions   . triamcinolone cream (KENALOG) 0.1 % APPLY A THIN LAYER TO AFFECTED AREAS 2 TIMES DAILY   . cyclobenzaprine (FLEXERIL) 10 MG tablet Take 10 mg by mouth 2 (two) times daily. 08/16/2018: Taking 1 pill every other day  . ibuprofen (ADVIL,MOTRIN) 600 MG tablet Take 1 tablet (600 mg total) by mouth every 6 (six) hours as needed. (Patient not taking: Reported on 05/27/2018)    No facility-administered encounter medications on file as of 02/12/2019.     Functional Status:  In your present state of health, do you have any difficulty performing the following activities: 02/12/2019 05/17/2018  Hearing? N N  Vision? N N  Difficulty concentrating or making decisions? N N  Walking or climbing stairs? N N  Dressing or bathing? N N  Doing errands, shopping? N N  Preparing Food and eating ? N N  Using the Toilet? N N  In the past six months, have you accidently leaked urine? N N  Do you have problems with loss of bowel control? N N  Managing your Medications? N N  Managing your Finances? N N  Housekeeping or managing your Housekeeping? N N  Some recent data might be hidden    Fall/Depression Screening: Fall Risk  02/12/2019 11/14/2018 08/16/2018  Falls in the past year? '1 1 1  '$ Comment last fall August 2019 Last Fall August 2019 last fall in August 2019  Number falls in past yr: '1 1 1  '$ Injury with Fall? '1 1 1  '$ Comment - - -  Risk Factor Category  High Risk (2 or more Points) High Risk (2 or more Points) High Risk (2 or more Points)  Risk for fall due to : History of fall(s);Medication side effect;Impaired balance/gait;Mental status change;Impaired mobility;Impaired vision History of fall(s);Impaired balance/gait;Medication side effect History of fall(s);Medication side effect;Impaired balance/gait  Follow up Falls evaluation  completed;Education provided;Falls prevention discussed Education provided;Falls evaluation completed;Falls prevention discussed Falls prevention discussed;Falls evaluation completed;Education provided   [facility] 2/9 Scores 02/12/2019 05/17/2018 07/20/2017 07/12/2017  PHQ - 2 Score 1 0 0 0   THN CM Care Plan Problem One     Most Recent Value  Care Plan Problem One  Knowledge deficiet related to diagnosis of diabetes  Role Documenting the Problem One  [facility] [place] for Problem One  Active  THN Long Term Goal   Patient will maintain Hgb A1C of below 6.5 in the next 6 months.  THN Long Term Goal Start Date  02/12/19  Interventions for Problem One Long Term Goal  Congratulated patient on current Hgb Aic of 6, reviewed medications and encouraged medication compliance, encouraged to keep and attend medical appointments, reviewed and encouraged diabetic low carbohydrate diet, encouraged to increase activity as tolerated, encouraged to continue to monitor blood sugars  THN CM Short Term Goal #1   Patient will work with [facility] Social Worker for housing resources in the next 3 months.  THN CM Short Term Goal #1 Start Date  02/12/19  [facility] CM Short Term Goal #1 Met Date  02/12/19  Interventions for Short Term Goal #1  Discussed housing resouces with patient, [facility] Social Work referral to assist with housing resouces, encouraged patient to work with [facility] Social Worker     Appointments:  Attended appointment with primary care provider on 01/14/2019 and follow up appointment 07/22/2019.  Plan: RN Health Coach will make [facility] Social Work referral for housing resources. RN Health Coach will send primary care provider quarterly update. RN Health Coach will make next telephone [name] to patient within the month of September.  [place] [phone] [name]

## 2019-02-13 ENCOUNTER — Other Ambulatory Visit: Payer: Self-pay

## 2019-02-13 NOTE — Patient Outreach (Addendum)
[place] [facility]) Care Management  02/13/2019  Alaster Asfaw Ishikawa 17-Mar-1946 [id number]  Social work referral received from [employer], Hubert Azure.  "Patient needs assistance with housing resources, currently living in a hotel for the last 3 months. Was living in a trailer but the owner died. Girlfriend stays with him, but he says the housing he has applied for will not let her stay with him and he needs her to stay with him " Unsuccessful outreach to patient today.  Left voicemail message and mailed unsuccessful outreach letter.  Will attempt to reach again within four business days.   Addendum: Received return call from patient.  Patient currently lives in a hotel and is seeking housing in the [place] area.  He has applied to several apartments already but was not accepted due to his girlfriend having a criminal record.  BSW agreed to mail housing resources but informed patient that her criminal record may continue to be a barrier.  BSW will follow up within two weeks to ensure receipt of resources.    Ronn Melena, BSW Social Worker [phone]

## 2019-02-18 ENCOUNTER — Ambulatory Visit: Payer: Medicare HMO

## 2019-02-27 ENCOUNTER — Other Ambulatory Visit: Payer: Self-pay

## 2019-02-27 NOTE — Patient Outreach (Signed)
[place] [facility]) Care Management  02/27/2019  Mark [name] 04/20/1946 [id number]   Successful follow up call to patient today.  Patient confirmed receipt of housing resources mailed on 02/13/19 and stated that he is in the process of reviewing and contacting resources.  Patient denied the need for further assistance at this time.  BSW closing case but encouraged him to call if additional needs arise.  Ronn Melena, BSW Social Worker [phone]

## 2019-04-23 ENCOUNTER — Other Ambulatory Visit: Payer: Self-pay | Admitting: *Deleted

## 2019-04-23 ENCOUNTER — Encounter: Payer: Self-pay | Admitting: *Deleted

## 2019-04-23 NOTE — Patient Outreach (Signed)
[place] [facility]) Care Management  04/23/2019  Shawn Carattini Kyllonen [birth year] [id number]   RN Health Coach Quarterly Outreach  Referral Date:07/12/2017 Referral Source:MD Office (Dr. Henrene Pastor) Reason for Referral:Diabetes Education and Support Insurance:[employer]   Outreach Attempt:  Successful telephone outreach to patient for follow up.  HIPAA verified with patient.  Patient reporting he has been having falls related to his leg/knee giving out.  States last fall was last week without injury.  Fall precautions and preventions reviewed and discussed.  Encouraged patient to use cane with all ambulation.  States he has been told he needs a knee replacement, but is reluctant to have it done at this time due to the pandemic. Encouraged patient to contact orthopedic for options.  Fasting blood sugar was 98 this morning with fasting ranges of 90-110's.  Continues to report living in [place] while awaiting to find housing.  Confirms compliance with medications.  Appointments:  Reports scheduled appointment with primary care provider, Dr. Henrene Pastor on 07/22/2019.  Plan: RN Health Coach will send patient EMMI Getting Up From  A Fall. RN Health Coach will send patient EMMI Preventing Falls. RN Health Coach will make next telephone outreach to patient within the month of October.  [place] [phone] [email or web address]

## 2019-05-28 ENCOUNTER — Other Ambulatory Visit: Payer: Self-pay

## 2019-05-28 NOTE — Patient Outreach (Signed)
[facility] [place] [facility]) Care Management  05/28/2019  Mark [name] December 04, 1945 [id number]   Telephone Assessment    Outreach attempt #1 to patient. Recording stating that call can not be completed as dialed. No alternate numbers to attempt.      Plan: RN CM will make outreach attempt to patient within one month.   [name], RN,BSN,CCM [place] Management Telephonic Care Management Coordinator Direct Phone: [phone] Toll Free: [phone] Fax: [phone]

## 2019-05-30 ENCOUNTER — Ambulatory Visit: Payer: Medicare HMO | Admitting: *Deleted

## 2019-05-30 ENCOUNTER — Ambulatory Visit: Payer: Medicare HMO

## 2019-06-09 ENCOUNTER — Other Ambulatory Visit: Payer: Self-pay

## 2019-06-09 NOTE — Patient Outreach (Signed)
[place] [facility]) Care Management  06/09/2019  Mark [name] 24-Aug-1945 [id number]   Telephone Assessment    Outreach attempt #2 to patient. No answer. RN CM left HIPAA compliant voicemail message along with contact info.     Plan: RN CM will make outreach attempt to patient within the month of November. RN CM will send unsuccessful outreach letter to patient.   [name], RN,BSN,CCM [place] Management Telephonic Care Management Coordinator Direct Phone: [phone] Toll Free: [phone] Fax: [phone]

## 2019-06-09 NOTE — Patient Outreach (Signed)
[place] [facility]) Care Management  06/09/2019   Mark [name] Jul 30, 1946 [id number]  Telephone Assessment   Voicemail message received from patient returning RN CM call. Return call placed to patient. He denies any acute issues or concerns at this time. He states that things have been going fairly well. He continues to reside in hotel. He is independent with ADLs/IADLs. He drives himself to MD appts. He voices that his next MD appt in on 07/23/2019. Patient states he plans to get a flu shot during that time as well. He denies any falls since last speaking with [facility] staff. He has cane but admits he does not use it. Appetite remains good. He is continuing to monitor blood sugars in the home. He reports that cbg this morning was 103. He denies any RN CM needs or concerns at this time.   Medications: Patient denies any issues affording and/or managing meds at this time.   Current Medications:  Current Outpatient Medications  Medication Sig Dispense Refill  . ACCU-CHEK AVIVA PLUS test strip TEST BLOOD SUGAR QD  3  . ACCU-CHEK SOFTCLIX LANCETS lancets USE TO CHECK BLOOD SUGAR ONCE A DAY  3  . aspirin 81 MG chewable tablet Chew by mouth daily.    Marland Kitchen atorvastatin (LIPITOR) 20 MG tablet Take 20 mg by mouth daily.    . carvedilol (COREG) 6.25 MG tablet Take 6.25 mg by mouth daily.    . cyclobenzaprine (FLEXERIL) 10 MG tablet Take 10 mg by mouth 2 (two) times daily.  1  . diclofenac sodium (VOLTAREN) 1 % GEL Apply 2 g topically 4 (four) times daily.    . furosemide (LASIX) 20 MG tablet Take 20 mg by mouth daily.    Marland Kitchen ibuprofen (ADVIL,MOTRIN) 600 MG tablet Take 1 tablet (600 mg total) by mouth every 6 (six) hours as needed. (Patient not taking: Reported on 05/27/2018) 30 tablet 0  . Magnesium 400 MG TABS Take 1 tablet by mouth daily.    . metFORMIN (GLUCOPHAGE) 500 MG tablet Take by mouth daily with breakfast.    . nitroGLYCERIN (NITROSTAT) 0.4 MG SL tablet Place 0.4 mg under the tongue  every 5 (five) minutes as needed for chest pain.    Marland Kitchen spironolactone (ALDACTONE) 25 MG tablet Take 25 mg by mouth daily.    Marland Kitchen triamcinolone cream (KENALOG) 0.1 % APPLY A THIN LAYER TO AFFECTED AREAS 2 TIMES DAILY  3   No current facility-administered medications for this visit.     Functional Status:  In your present state of health, do you have any difficulty performing the following activities: 02/12/2019  Hearing? N  Vision? N  Difficulty concentrating or making decisions? N  Walking or climbing stairs? N  Dressing or bathing? N  Doing errands, shopping? N  Preparing Food and eating ? N  Using the Toilet? N  In the past six months, have you accidently leaked urine? N  Do you have problems with loss of bowel control? N  Managing your Medications? N  Managing your Finances? N  Housekeeping or managing your Housekeeping? N  Some recent data might be hidden    Fall/Depression Screening: Fall Risk  06/09/2019 04/23/2019 02/12/2019  Falls in the past year? 1 1 1   Comment - Fall September 2020-leg gave out last fall August 2019  Number falls in past yr: 1 1 1   Injury with Fall? 1 0 1  Comment - - -  Risk Factor Category  - High Risk (2 or more Points)  High Risk (2 or more Points)  Risk for fall due to : History of fall(s);Impaired balance/gait;Medication side effect;Impaired mobility;Impaired vision History of fall(s);Medication side effect;Impaired balance/gait;Impaired mobility History of fall(s);Medication side effect;Impaired balance/gait;Mental status change;Impaired mobility;Impaired vision  Follow up Education provided;Falls evaluation completed Falls prevention discussed;Education provided;Falls evaluation completed Falls evaluation completed;Education provided;Falls prevention discussed   PHQ 2/9 Scores 06/09/2019 02/12/2019 05/17/2018 07/20/2017 07/12/2017  PHQ - 2 Score 0 1 0 0 0    Assessment:  THN CM Care Plan Problem One     Most Recent Value  Care Plan Problem One   Knowledge deficit related to disease process and mgmt of Diabetes.  Role Documenting the Problem One  Care Management Telephonic Coordinator  Care Plan for Problem One  Active  [facility] Long Term Goal   Patient will maintain A1C level of 6.0 or lesser over the next 90 days.  THN Long Term Goal Start Date  06/09/19  Interventions for Problem One Long Term Goal  RNCM reviewed DM gmmt with pt. RN CM assesses for cbg monitoring. RN CM reviewed diet restrictions.  THN CM Short Term Goal #1   Patient will report monitoring and recording cbgs values consistently over the next 30 days.  THN CM Short Term Goal #1 Start Date  06/09/19  Interventions for Short Term Goal #1  RN CM discussed with pt. the importance of freq monitoring. RN CM confirmed that pt. has working device in the home and knows how to check it.    [facility] CM Care Plan Problem Two     Most Recent Value  Care Plan Problem Two  Health maintenance-patient needs annual flu vaccine.  Role Documenting the Problem Two  Care Management Assistant  Care Plan for Problem Two  Active  THN CM Short Term Goal #1   Patient will report obtaining annual flu vaccine over the next 60 days.  THN CM Short Term Goal #1 Start Date  06/09/19  Interventions for Short Term Goal #2   RNCM assessed if pt had received vaccine. RN CM instructed pt. on the importance of getting vaccine and assessed for any barriers.     Plan:  RN CM will make outreach attempt to patient in the month of Dec. RN CM will send MD quarterly update by routing encounter.  Antionette Fairy, RN,BSN,CCM [facility] Care Management Telephonic Care Management Coordinator Direct Phone: [phone] Toll Free: [phone] Fax: [phone]

## 2019-06-10 ENCOUNTER — Ambulatory Visit: Payer: Self-pay

## 2019-06-16 DIAGNOSIS — R05 Cough: Secondary | ICD-10-CM | POA: Diagnosis not present

## 2019-06-16 DIAGNOSIS — J018 Other acute sinusitis: Secondary | ICD-10-CM | POA: Diagnosis not present

## 2019-06-17 DIAGNOSIS — R05 Cough: Secondary | ICD-10-CM | POA: Diagnosis not present

## 2019-06-23 DIAGNOSIS — J441 Chronic obstructive pulmonary disease with (acute) exacerbation: Secondary | ICD-10-CM | POA: Diagnosis not present

## 2019-06-23 DIAGNOSIS — Z1159 Encounter for screening for other viral diseases: Secondary | ICD-10-CM | POA: Diagnosis not present

## 2019-07-08 ENCOUNTER — Ambulatory Visit: Payer: Self-pay

## 2019-07-23 ENCOUNTER — Other Ambulatory Visit: Payer: Self-pay

## 2019-07-23 DIAGNOSIS — I5032 Chronic diastolic (congestive) heart failure: Secondary | ICD-10-CM | POA: Diagnosis not present

## 2019-07-23 DIAGNOSIS — N400 Enlarged prostate without lower urinary tract symptoms: Secondary | ICD-10-CM | POA: Diagnosis not present

## 2019-07-23 DIAGNOSIS — E1129 Type 2 diabetes mellitus with other diabetic kidney complication: Secondary | ICD-10-CM | POA: Diagnosis not present

## 2019-07-23 DIAGNOSIS — E782 Mixed hyperlipidemia: Secondary | ICD-10-CM | POA: Diagnosis not present

## 2019-07-23 DIAGNOSIS — E1159 Type 2 diabetes mellitus with other circulatory complications: Secondary | ICD-10-CM | POA: Diagnosis not present

## 2019-07-23 DIAGNOSIS — Z6832 Body mass index (BMI) 32.0-32.9, adult: Secondary | ICD-10-CM | POA: Diagnosis not present

## 2019-07-23 DIAGNOSIS — Z23 Encounter for immunization: Secondary | ICD-10-CM | POA: Diagnosis not present

## 2019-07-23 DIAGNOSIS — E1121 Type 2 diabetes mellitus with diabetic nephropathy: Secondary | ICD-10-CM | POA: Diagnosis not present

## 2019-07-23 DIAGNOSIS — I10 Essential (primary) hypertension: Secondary | ICD-10-CM | POA: Diagnosis not present

## 2019-07-23 DIAGNOSIS — I2510 Atherosclerotic heart disease of native coronary artery without angina pectoris: Secondary | ICD-10-CM | POA: Diagnosis not present

## 2019-07-23 NOTE — Patient Outreach (Signed)
[place] [facility]) Care Management  07/23/2019  Mark [name] Mark [name] 1946-08-09 [id number]   Telephone Assessment    Outreach attempt #1 to patient. Spoke with patient. He reports that he is just leaving from PCP appt. He denies any acute issues or concerns at present. He voices that things have been going well he states that he got a good report at MD office. MD was pleased with his BP. He had lab work done today and will get a call from office with results.patitn voices that he did not check his blood sugar this morning as he was trying to get to his appt but reports cbgs have bene in the lower 100s. Patient reports he got his flu shot today at MD appt. Appetieit remains good. He denies any recent falls.   THN CM Care Plan Problem One     Most Recent Value  Care Plan Problem One  Knowledge deficit related to disease process and mgmt of Diabetes.  Role Documenting the Problem One  Care Management Telephonic Coordinator  Care Plan for Problem One  Active  [facility] Long Term Goal   Patient will maintain A1C level of 6.0 or lesser over the next 90 days.  THN Long Term Goal Start Date  06/09/19  Interventions for Problem One Long Term Goal  RN CM assessed for cbg montioring and recent values. RN CM discussed diet restiricitons. RN CM reviewed importance of A1C testing.   THN CM Short Term Goal #1   Patient will report monitoring and recording cbgs values consistently over the next 30 days.  THN CM Short Term Goal #1 Start Date  06/09/19  [facility] CM Short Term Goal #1 Met Date  07/23/19    [facility] CM Care Plan Problem Two     Most Recent Value  Care Plan Problem Two  Health maintenance-patient needs annual flu vaccine.  Role Documenting the Problem Two  Care Management Assistant  Care Plan for Problem Two  Active  THN CM Short Term Goal #1   Patient will report obtaining annual flu vaccine over the next 60 days.  THN CM Short Term Goal #1 Start Date  06/09/19  [facility] CM Short Term Goal #1 Met Date    07/23/19       Plan: RN CM discussed with patient next outreach within the month of February. Patient gave verbal consent and in agreement with RN CM follow up and  timeframe. Patient aware that they may contact RN CM sooner for any issues or concerns.  [name], RN,BSN,CCM [place] Management Telephonic Care Management Coordinator Direct Phone: [phone] Toll Free: [phone] Fax: [phone]

## 2019-07-24 ENCOUNTER — Ambulatory Visit: Payer: Self-pay

## 2019-09-22 ENCOUNTER — Other Ambulatory Visit: Payer: Self-pay

## 2019-09-22 NOTE — Patient Outreach (Signed)
[facility]) Care Management  09/22/2019  Vernon Maish Pelto 1945/12/14 [id number]   Telephone Assessment    Outreach # 1 to patient. No answer at present. RN CM left HIPAA compliant voicemail message along with contact info.      Plan: RN CM will make outreach attempt to patient within a month if no return call from patient.   Antionette Fairy, RN,BSN,CCM [facility] Care Management Telephonic Care Management Coordinator Direct Phone: [phone] Toll Free: [phone] Fax: [phone]

## 2019-09-23 ENCOUNTER — Ambulatory Visit: Payer: Self-pay

## 2019-10-16 ENCOUNTER — Other Ambulatory Visit: Payer: Self-pay

## 2019-10-16 NOTE — Patient Outreach (Signed)
[facility]) Care Management  10/16/2019  Mark [name] Jan 05, 1946 [id number]   Telephone Assessment   Outreach attempt to patient. Spoke with patient briefly as he was currently driving. He denies any acte issues or concerns and states that things have bene going the same. Patient states that he has upcoming PCP appt on 11/27/2019. He plans to discuss with MD about getting COVID-19 vaccine. Patient reports that he is hesitant and concerned about getting it given his medical history. He voices that if MD okays for him to get vaccinated then he will probably do so. Appetite remains WNL for patient. Blood sugars continue to be fairly controlled and ranging the low 100's per pt report. Last A1C level was 6.1(Dec 2020).He denies any recent falls.   Medications Reviewed Today    Reviewed by Hayden Pedro, RN (Registered Nurse) on 10/16/19 at [phone]  Med List Status: <None>  Medication Order Taking? Sig Documenting Provider Last Dose Status Informant  ACCU-CHEK AVIVA PLUS test strip [id number] No TEST BLOOD SUGAR QD [provider] Taking Active   ACCU-CHEK SOFTCLIX LANCETS lancets [id number] No USE TO CHECK BLOOD SUGAR ONCE A DAY [provider] Taking Active   aspirin 81 MG chewable tablet [id number] No Chew by mouth daily. [provider] Taking Active Self  atorvastatin (LIPITOR) 20 MG tablet [id number] No Take 20 mg by mouth daily. [provider] Taking Active Self  carvedilol (COREG) 6.25 MG tablet [id number] No Take 6.25 mg by mouth daily. [provider] Taking Active Self  cyclobenzaprine (FLEXERIL) 10 MG tablet [id number] No Take 10 mg by mouth 2 (two) times daily. [provider] Not Taking Active            Med Note Leona Singleton   Fri Aug 16, 2018  2:47 PM) Taking 1 pill every other day  diclofenac sodium (VOLTAREN) 1 % GEL [id number] No Apply 2 g topically 4 (four) times daily. [provider]  Taking Active Self  furosemide (LASIX) 20 MG tablet [id number] No Take 20 mg by mouth daily. [provider] Taking Active Self           Med Note Shelby Mattocks, Sheppard Evens D   Wed Feb 12, 2019  3:02 PM) Reports taking 1 tablet every other day per providers instructions  ibuprofen (ADVIL,MOTRIN) 600 MG tablet [id number] No Take 1 tablet (600 mg total) by mouth every 6 (six) hours as needed.  Patient not taking: Reported on 05/27/2018   Lacretia Leigh, MD Not Taking Active   Magnesium 400 MG TABS [id number] No Take 1 tablet by mouth daily. [provider] Taking Active   metFORMIN (GLUCOPHAGE) 500 MG tablet [id number] No Take by mouth daily with breakfast. [provider] Taking Active Self  nitroGLYCERIN (NITROSTAT) 0.4 MG SL tablet [id number] No Place 0.4 mg under the tongue every 5 (five) minutes as needed for chest pain. [provider] Taking Active Self  spironolactone (ALDACTONE) 25 MG tablet [id number] No Take 25 mg by mouth daily. [provider] Taking Active Self           Med Note Shelby Mattocks, Sheppard Evens D   Wed Feb 12, 2019  2:59 PM) Reports taking 1 tablet every other day per providers instructions   triamcinolone cream (KENALOG) 0.1 % [id number] No APPLY A THIN LAYER TO AFFECTED AREAS 2 TIMES DAILY [provider] Taking Active          [facility] CM Care Plan Problem One  Most Recent Value  Care Plan Problem One  Knowledge deficit related to disease process and mgmt of Diabetes.  Role Documenting the Problem One  Care Management Telephonic Coordinator  Care Plan for Problem One  Active  [facility] Long Term Goal   Patient will maintain A1C level of 6.0 or lesser over the next 90 days.  [facility] Long Term Goal Start Date  06/09/19  Interventions for Problem One Long Term Goal  RN CM assessed cbg monitoring and values. RN CM reviewed and discussed most recent A1C level of 6.1    [facility] CM Care Plan Problem Two     Most Recent Value  Care Plan Problem Two  Health  maintenance-patient needs COVID-19 vaccine.  Role Documenting the Problem Two  Care Management Assistant  Care Plan for Problem Two  Active  Interventions for Problem Two Long Term Goal   RNCM reviewd and discussed pros and cons with pt. RNCM encouraged pt to talk with PCP.  [facility] Long Term Goal  Patient will report obtaining COVID-19 vaccine within the next 90 days.  [facility] Long Term Goal Start Date  10/16/19  [facility] CM Short Term Goal #1   Patient will report discussing vaccine with PCP during appt in the next 30 days.  [facility] CM Short Term Goal #1 Start Date  10/16/19  Interventions for Short Term Goal #2   RN CM confirmed pt has PCP appt already scheduled and plans to discuss with MD.      Plan: RN CM discussed with patient next outreach within the month of May. Patient gave verbal consent and in agreement with RN CM follow up and timeframe. Patient aware that they may contact RN CM sooner for any issues or concerns.  RN CM will send quarterly update to PCP.  Antionette Fairy, RN,BSN,CCM [facility] Care Management Telephonic Care Management Coordinator Direct Phone: [phone] Toll Free: [phone] Fax: [phone]

## 2019-10-21 ENCOUNTER — Ambulatory Visit: Payer: Self-pay

## 2019-10-29 ENCOUNTER — Other Ambulatory Visit: Payer: Self-pay | Admitting: Legal Medicine

## 2019-11-27 ENCOUNTER — Encounter: Payer: Self-pay | Admitting: Legal Medicine

## 2019-11-27 ENCOUNTER — Other Ambulatory Visit: Payer: Self-pay

## 2019-11-27 ENCOUNTER — Ambulatory Visit (INDEPENDENT_AMBULATORY_CARE_PROVIDER_SITE_OTHER): Payer: Medicare HMO | Admitting: Legal Medicine

## 2019-11-27 VITALS — BP 128/70 | HR 68 | Temp 97.3°F | Resp 18 | Ht 67.0 in | Wt 206.4 lb

## 2019-11-27 DIAGNOSIS — E1159 Type 2 diabetes mellitus with other circulatory complications: Secondary | ICD-10-CM | POA: Insufficient documentation

## 2019-11-27 DIAGNOSIS — E782 Mixed hyperlipidemia: Secondary | ICD-10-CM | POA: Diagnosis not present

## 2019-11-27 DIAGNOSIS — E1169 Type 2 diabetes mellitus with other specified complication: Secondary | ICD-10-CM | POA: Diagnosis not present

## 2019-11-27 DIAGNOSIS — Z23 Encounter for immunization: Secondary | ICD-10-CM | POA: Insufficient documentation

## 2019-11-27 DIAGNOSIS — N529 Male erectile dysfunction, unspecified: Secondary | ICD-10-CM

## 2019-11-27 DIAGNOSIS — I5032 Chronic diastolic (congestive) heart failure: Secondary | ICD-10-CM

## 2019-11-27 DIAGNOSIS — I739 Peripheral vascular disease, unspecified: Secondary | ICD-10-CM

## 2019-11-27 DIAGNOSIS — I10 Essential (primary) hypertension: Secondary | ICD-10-CM

## 2019-11-27 DIAGNOSIS — E1121 Type 2 diabetes mellitus with diabetic nephropathy: Secondary | ICD-10-CM | POA: Insufficient documentation

## 2019-11-27 HISTORY — DX: Male erectile dysfunction, unspecified: N52.9

## 2019-11-27 HISTORY — DX: Type 2 diabetes mellitus with diabetic nephropathy: E11.21

## 2019-11-27 MED ORDER — SILDENAFIL CITRATE 20 MG PO TABS: 20.0000 mg | ORAL_TABLET | ORAL | 3 refills | Status: DC

## 2019-11-27 NOTE — Assessment & Plan Note (Signed)
Patient has ED but needs to use NTG so does not want viagra.

## 2019-11-27 NOTE — Assessment & Plan Note (Signed)

## 2019-11-27 NOTE — Assessment & Plan Note (Signed)

## 2019-11-27 NOTE — Assessment & Plan Note (Signed)
Patient having claudication right leg with waking that causes him to stop.  ABIs scheduled

## 2019-11-27 NOTE — Assessment & Plan Note (Signed)
An individual care plan for diabetes was established and reinforced today.  The patient's status was assessed using clinical findings on exam, labs and diagnostic testing. Patient success at meeting goals based on disease specific evidence-based guidelines and found to be good controlled. Medications were assessed and patient's understanding of the medical issues , including barriers were assessed. Recommend adherence to a diabetic diet, a graduated exercise program, HgbA1c level is checked quarterly, and urine microalbumin performed yearly .  Annual mono-filament sensation testing performed. Lower blood pressure and control hyperlipidemia is important. Get annual eye exams and annual flu shots and smoking cessation discussed.  Self management goals were discussed. 

## 2019-11-27 NOTE — Assessment & Plan Note (Signed)
An individualized care plan was established and reinforced.  The patient's disease status was assessed using clinical finding son exam today, labs, and/or other diagnostic testing such as x-rays, to determine the patient's success in meeting treatmentgoalsbased on disease-based guidelines and found to beimproving. But not at goal yet. Medications prescriptions no change Laboratory tests ordered to be performed today include BNP. RECOMMENDATIONS: given include see cardiology.  Call physician is patient gains 3 lbs in one day or 5 lbs for one week.  Call for progressive PND, orthopnea or increased pedal edema.

## 2019-11-27 NOTE — Progress Notes (Signed)
Established Patient Office Visit  Subjective:  Patient ID: Mark [name], male    DOB: 01/09/1946  Age: 74 y.o. MRN: [id number]  CC:  Chief Complaint  Patient presents with  . Hypertension  . Hyperlipidemia  . Diabetes   I reviewed all old records HPI Mark [name] presents for Chronic visit.  Patient presents for follow up of hypertension.  Patient tolerating carvdilol well with side effects.  Patient was diagnosed with hypertension 2010 so has been treated for hypertension for 10 years.Patient is working on maintaining diet and exercise regimen and follows up as directed. Complication include diastolic heart failure.  Patient present with type 2 diabetes.  Specifically, this is type 2, noninsulin requiring diabetes, complicated by vascular disease and nephropathy.  Compliance with treatment has been good; patient take medicines as directed, maintains diet and exercise regimen, follows up as directed, and is keeping glucose diary.  Date of  diagnosis 2010.  Depression screen has been performed.Tobacco screen nonsmoker. Current medicines for diabetes metformin.  Patient is on nothing for renal protection and atorvastatin for cholesterol control.  Patient performs foot exams daily and last ophthalmologic exam was not 2 years.  Patient presents with hyperlipidemia.  Compliance with treatment has been good; patient takes medicines as directed, maintains low cholesterol diet, follows up as directed, and maintains exercise regimen.  Patient is using atorvastatin without problems.  He complains of right leg cramps when walking  Patient presents with diastolic  that is stable. Diagnosis made 5 years.  The course of the disease is stable.  Current medicines include spironolactone and carvedilol. Patient follows a low cholesterol diet and maintains a weight diary.  Patient is on low salt, low cholesterol diet and avoids alcohol.  Patient denies adverse effects of medicines. Patient is  monitoring weight and has stable of weight.  Patient is having no pedal edema, no PND and no PND.  Patient is continuing to see cardiology. Past Medical History:  Diagnosis Date  . Anginal pain ([place])   . Arthritis   . CHF (congestive heart failure) ([place])   . Chronic diastolic heart failure ([place])   . Chronic kidney disease   . Coronary artery disease   . Diabetes mellitus without complication ([place])   . DVT (deep venous thrombosis) ([place])   . Essential hypertension   . History of BPH   . Hypertension   . Myocardial infarction ([place])   . Peripheral vascular disease ([place])   . Type 2 diabetes mellitus with other specified complication [facility])     Past Surgical History:  Procedure Laterality Date  . HERNIA REPAIR    . INGUINAL HERNIA REPAIR Bilateral     History reviewed. No pertinent family history.  Social History   Socioeconomic History  . Marital status: Single    Spouse name: Not on file  . Number of children: Not on file  . Years of education: Not on file  . Highest education level: Not on file  Occupational History  . Not on file  Tobacco Use  . Smoking status: Never Smoker  . Smokeless tobacco: Never Used  Substance and Sexual Activity  . Alcohol use: No  . Drug use: No  . Sexual activity: Not on file  Other Topics Concern  . Not on file  Social History Narrative  . Not on file   Social Determinants of Health   Financial Resource Strain: High Risk  . Difficulty of Paying Living Expenses: Very hard  Food Insecurity: Food Insecurity  Present  . Worried About Charity fundraiser in the Last Year: Sometimes true  . Ran Out of Food in the Last Year: Sometimes true  Transportation Needs: No Transportation Needs  . Lack of Transportation (Medical): No  . Lack of Transportation (Non-Medical): No  Physical Activity: Inactive  . Days of Exercise per Week: 0 days  . Minutes of Exercise per Session: 0 min  Stress: Stress Concern Present  . Feeling of Stress : Rather  much  Social Connections:   . Frequency of Communication with Friends and Family:   . Frequency of Social Gatherings with Friends and Family:   . Attends Religious Services:   . Active Member of Clubs or Organizations:   . Attends Archivist Meetings:   Marland Kitchen Marital Status:   Intimate Partner Violence:   . Fear of Current or Ex-Partner:   . Emotionally Abused:   Marland Kitchen Physically Abused:   . Sexually Abused:     Outpatient Medications Prior to Visit  Medication Sig Dispense Refill  . ACCU-CHEK AVIVA PLUS test strip TEST BLOOD SUGAR QD  3  . ACCU-CHEK SOFTCLIX LANCETS lancets USE TO CHECK BLOOD SUGAR ONCE A DAY  3  . albuterol (VENTOLIN HFA) 108 (90 Base) MCG/ACT inhaler     . aspirin 81 MG chewable tablet Chew by mouth daily.    Marland Kitchen atorvastatin (LIPITOR) 20 MG tablet Take 20 mg by mouth daily.    . carvedilol (COREG) 6.25 MG tablet Take 6.25 mg by mouth daily.    . cyclobenzaprine (FLEXERIL) 10 MG tablet Take 10 mg by mouth 2 (two) times daily.  1  . diclofenac sodium (VOLTAREN) 1 % GEL Apply 2 g topically 4 (four) times daily.    . furosemide (LASIX) 20 MG tablet Take 20 mg by mouth daily.    . Magnesium 400 MG TABS Take 1 tablet by mouth daily.    . metFORMIN (GLUCOPHAGE) 500 MG tablet TAKE 1/2 TABLET TWICE DAILY 90 tablet 2  . nitroGLYCERIN (NITROSTAT) 0.4 MG SL tablet Place 0.4 mg under the tongue every 5 (five) minutes as needed for chest pain.    Marland Kitchen spironolactone (ALDACTONE) 25 MG tablet Take 25 mg by mouth daily.    Marland Kitchen triamcinolone cream (KENALOG) 0.1 % APPLY A THIN LAYER TO AFFECTED AREAS 2 TIMES DAILY  3  . diclofenac Sodium (VOLTAREN) 1 % GEL     . ibuprofen (ADVIL,MOTRIN) 600 MG tablet Take 1 tablet (600 mg total) by mouth every 6 (six) hours as needed. (Patient not taking: Reported on 05/27/2018) 30 tablet 0   No facility-administered medications prior to visit.    Allergies  Allergen Reactions  . Penicillins Rash    Can take by mouth, can not take injections will  break out in a rash    ROS Review of Systems  Constitutional: Negative.   HENT: Negative.   Respiratory: Negative.   Cardiovascular:       No othropnea or PND, right leg claudication  Endocrine: Negative.   Genitourinary: Negative.   Musculoskeletal: Negative.   Neurological: Negative.   Psychiatric/Behavioral: Negative.       Objective:    Physical Exam  Constitutional: He is oriented to [place], place, and time. He appears well-developed and well-nourished.  HENT:  Head: Normocephalic and atraumatic.  Right Ear: External ear normal.  Left Ear: External ear normal.  Nose: Nose normal.  Mouth/Throat: Oropharynx is clear and moist.  Eyes: Pupils are equal, round, and reactive to light. Conjunctivae  are normal.  Cardiovascular: Normal rate, regular rhythm, normal heart sounds and intact distal pulses.  Pulmonary/Chest: Breath sounds normal.  Abdominal: Soft. Bowel sounds are normal.  Musculoskeletal:        General: Normal range of motion.     Cervical back: Normal range of motion and neck supple.  Neurological: He is alert and oriented to [place], place, and time. He has normal reflexes.  Skin: Skin is dry.  Psychiatric: He has a normal mood and affect. His behavior is normal. Judgment and thought content normal.  Vitals reviewed.   BP 128/70   Pulse 68   Temp (!) 97.3 F (36.3 C)   Resp 18   Ht '5\' 7"'$  (1.702 m)   Wt 206 lb 6.4 oz (93.6 kg)   SpO2 94%   BMI 32.33 kg/m  Wt Readings from Last 3 Encounters:  11/27/19 206 lb 6.4 oz (93.6 kg)  03/28/18 201 lb (91.2 kg)     Health Maintenance Due  Topic Date Due  . HEMOGLOBIN A1C  Never done  . Hepatitis C Screening  Never done  . OPHTHALMOLOGY EXAM  Never done  . URINE MICROALBUMIN  Never done  . TETANUS/TDAP  Never done  . COLONOSCOPY  Never done  . PNA vac Low Risk Adult (1 of 2 - PCV13) Never done    There are no preventive care reminders to display for this patient.  No results found for: TSH No  results found for: WBC, HGB, HCT, MCV, PLT No results found for: NA, K, CHLORIDE, CO2, GLUCOSE, BUN, CREATININE, BILITOT, ALKPHOS, AST, ALT, PROT, ALBUMIN, CALCIUM, ANIONGAP, EGFR, GFR No results found for: CHOL No results found for: HDL No results found for: LDLCALC No results found for: TRIG No results found for: CHOLHDL No results found for: HGBA1C    Assessment & Plan:   Problem List Items Addressed This Visit      Cardiovascular and Mediastinum   Essential hypertension - Primary    An individual hypertension care plan was established and reinforced today.  The patient's status was assessed using clinical findings on exam and labs or diagnostic tests. The patient's success at meeting treatment goals on disease specific evidence-based guidelines and found to be well controlled. SELF MANAGEMENT: The patient and I together assessed ways to personally work towards obtaining the recommended goals. RECOMMENDATIONS: avoid decongestants found in common cold remedies, decrease consumption of alcohol, perform routine monitoring of BP with home BP cuff, exercise, reduction of dietary salt, take medicines as prescribed, try not to miss doses and quit smoking.  Regular exercise and maintaining a healthy weight is needed.  Stress reduction may help. A CLINICAL SUMMARY including written plan identify barriers to care unique to individual due to social or financial issues.  We attempt to mutually creat solutions for individual and family understanding.      Relevant Orders   CBC with Differential   Comprehensive metabolic panel   Chronic diastolic heart failure [facility])    An individualized care plan was established and reinforced.  The patient's disease status was assessed using clinical finding son exam today, labs, and/or other diagnostic testing such as x-rays, to determine the patient's success in meeting treatmentgoalsbased on disease-based guidelines and found to beimproving. But not at goal  yet. Medications prescriptions no change Laboratory tests ordered to be performed today include BNP. RECOMMENDATIONS: given include see cardiology.  Call physician is patient gains 3 lbs in one day or 5 lbs for one week.  Call for  progressive PND, orthopnea or increased pedal edema.      Relevant Orders   Brain natriuretic peptide   Peripheral vascular disease ([place])    Patient having claudication right leg with waking that causes him to stop.  ABIs scheduled      Type 2 diabetes mellitus with vascular disease ([place])    An individual care plan for diabetes was established and reinforced today.  The patient's status was assessed using clinical findings on exam, labs and diagnostic testing. Patient success at meeting goals based on disease specific evidence-based guidelines and found to be good controlled. Medications were assessed and patient's understanding of the medical issues , including barriers were assessed. Recommend adherence to a diabetic diet, a graduated exercise program, HgbA1c level is checked quarterly, and urine microalbumin performed yearly .  Annual mono-filament sensation testing performed. Lower blood pressure and control hyperlipidemia is important. Get annual eye exams and annual flu shots and smoking cessation discussed.  Self management goals were discussed.        Endocrine   Diabetic glomerulopathy [facility])    An individual care plan for diabetes was established and reinforced today.  The patient's status was assessed using clinical findings on exam, labs and diagnostic testing. Patient success at meeting goals based on disease specific evidence-based guidelines and found to be good controlled. Medications were assessed and patient's understanding of the medical issues , including barriers were assessed. Recommend adherence to a diabetic diet, a graduated exercise program, HgbA1c level is checked quarterly, and urine microalbumin performed yearly .  Annual mono-filament  sensation testing performed. Lower blood pressure and control hyperlipidemia is important. Get annual eye exams and annual flu shots and smoking cessation discussed.  Self management goals were discussed.      Relevant Orders   Hemoglobin A1c     Other   Hyperlipidemia    AN INDIVIDUAL CARE PLAN for hyperlipidemia/ cholesterol was established and reinforced today.  The patient's status was assessed using clinical findings on exam, lab and other diagnostic tests. The patient's disease status was assessed based on evidence-based guidelines and found to be well controlled. MEDICATIONS were reviewed. SELF MANAGEMENT GOALS have been discussed and patient's success at attaining the goal of low cholesterol was assessed. RECOMMENDATION given include regular exercise 3 days a week and low cholesterol/low fat diet. CLINICAL SUMMARY including written plan to identify barriers unique to the patient due to social or economic  reasons was discussed.      Relevant Orders   Lipid Panel   ED (erectile dysfunction)    Patient has ED but needs to use NTG so does not want viagra.         Meds ordered this encounter  Medications  . DISCONTD: sildenafil (REVATIO) 20 MG tablet    Sig: Take 1 tablet (20 mg total) by mouth See admin instructions. Take 3 to 5 pills 20 minutes before intercourse    Dispense:  50 tablet    Refill:  3    Follow-up: Return in about 4 months (around 03/28/2020) for fasting.    Reinaldo Meeker, MD

## 2019-11-28 LAB — COMPREHENSIVE METABOLIC PANEL
ALT: 14 IU/L (ref 0–44)
AST: 21 IU/L (ref 0–40)
Albumin/Globulin Ratio: 1.6 (ref 1.2–2.2)
Albumin: 4.2 g/dL (ref 3.7–4.7)
Alkaline Phosphatase: 66 IU/L (ref 39–117)
BUN/Creatinine Ratio: 14 (ref 10–24)
BUN: 16 mg/dL (ref 8–27)
Bilirubin Total: 0.5 mg/dL (ref 0.0–1.2)
CO2: 24 mmol/L (ref 20–29)
Calcium: 9.7 mg/dL (ref 8.6–10.2)
Chloride: 108 mmol/L — ABNORMAL HIGH (ref 96–106)
Creatinine, Ser: 1.16 mg/dL (ref 0.76–1.27)
GFR calc Af Amer: 71 mL/min/{1.73_m2} (ref >59)
GFR calc non Af Amer: 62 mL/min/{1.73_m2} (ref >59)
Globulin, Total: 2.6 g/dL (ref 1.5–4.5)
Glucose: 122 mg/dL — ABNORMAL HIGH (ref 65–99)
Potassium: 4.5 mmol/L (ref 3.5–5.2)
Sodium: 145 mmol/L — ABNORMAL HIGH (ref 134–144)
Total Protein: 6.8 g/dL (ref 6.0–8.5)

## 2019-11-28 LAB — CBC WITH DIFFERENTIAL/PLATELET
Basophils Absolute: 0 10*3/uL (ref 0.0–0.2)
Basos: 0 %
EOS (ABSOLUTE): 0.1 10*3/uL (ref 0.0–0.4)
Eos: 3 %
Hematocrit: 40.6 % (ref 37.5–51.0)
Hemoglobin: 13.4 g/dL (ref 13.0–17.7)
Immature Grans (Abs): 0 10*3/uL (ref 0.0–0.1)
Immature Granulocytes: 0 %
Lymphocytes Absolute: 2.3 10*3/uL (ref 0.7–3.1)
Lymphs: 51 %
MCH: 31.3 pg (ref 26.6–33.0)
MCHC: 33 g/dL (ref 31.5–35.7)
MCV: 95 fL (ref 79–97)
Monocytes Absolute: 0.5 10*3/uL (ref 0.1–0.9)
Monocytes: 11 %
Neutrophils Absolute: 1.6 10*3/uL (ref 1.4–7.0)
Neutrophils: 35 %
Platelets: 212 10*3/uL (ref 150–450)
RBC: 4.28 x10E6/uL (ref 4.14–5.80)
RDW: 14.1 % (ref 11.6–15.4)
WBC: 4.5 10*3/uL (ref 3.4–10.8)

## 2019-11-28 LAB — LIPID PANEL
Chol/HDL Ratio: 3.7 ratio (ref 0.0–5.0)
Cholesterol, Total: 140 mg/dL (ref 100–199)
HDL: 38 mg/dL — ABNORMAL LOW (ref >39)
LDL Chol Calc (NIH): 80 mg/dL (ref 0–99)
Triglycerides: 123 mg/dL (ref 0–149)
VLDL Cholesterol Cal: 22 mg/dL (ref 5–40)

## 2019-11-28 LAB — CARDIOVASCULAR RISK ASSESSMENT

## 2019-11-28 LAB — HEMOGLOBIN A1C
Est. average glucose Bld gHb Est-mCnc: 131 mg/dL
Hgb A1c MFr Bld: 6.2 % — ABNORMAL HIGH (ref 4.8–5.6)

## 2019-11-28 LAB — BRAIN NATRIURETIC PEPTIDE: BNP: 131.6 pg/mL — ABNORMAL HIGH (ref 0.0–100.0)

## 2019-11-30 NOTE — Progress Notes (Signed)
CBC good, glucose 122, kidney tests normal, liver tests normal, Cholesterol normal, A1c 6.2, BNP 131 slightly high- measure of congestive failure. lp

## 2019-12-17 ENCOUNTER — Other Ambulatory Visit: Payer: Self-pay | Admitting: Legal Medicine

## 2019-12-22 ENCOUNTER — Other Ambulatory Visit: Payer: Self-pay

## 2019-12-22 NOTE — Patient Outreach (Signed)
[facility]) Care Management  12/22/2019  Mark [name] [place]charles 1946-02-01 [id number]   Telephone Assessment    Outreach attempt #1 to patient. Spoke with patient who reports he is doing well. He denies any acute issues or concerns at present. He saw PCP last month. He reports after visit nurse called him dn told him "his heart was a little weak." Patient denies any cardiac sxs and reports he has no further workup scheduled. His BP has been fairly controlled. He continues to monitor BP in the home.He is checking cbgs as well. A1C at PCP visit was 6.2 and goal for him is 7 or less. Patient reports that he is trying to adhere to diet restrictions. He denies any recent falls. He continues to live with supportive girlfriend. RNCM discussed COVID-19 vaccine. Patient states he did talk with PCP regarding this. He is still a little hesitant and unsure if he want to get it or not. RN CM discussed pros and cons and informed patient on how to obtain vaccine if he decided to do so.     Plan: RN CM discussed with patient next outreach within the month of July. Patient gave verbal consent and in agreement with RN CM follow up and timeframe. Patient aware that they may contact RN CM sooner for any issues or concerns.  Antionette Fairy, RN,BSN,CCM [facility] Care Management Telephonic Care Management Coordinator Direct Phone: [phone] Toll Free: [phone] Fax: [phone]

## 2019-12-29 ENCOUNTER — Other Ambulatory Visit: Payer: Self-pay | Admitting: Family Medicine

## 2019-12-29 NOTE — Telephone Encounter (Signed)
Your pt

## 2020-02-20 ENCOUNTER — Other Ambulatory Visit: Payer: Self-pay

## 2020-02-20 NOTE — Patient Outreach (Signed)
[facility]) Care Management  02/20/2020  Mark [name] 02/19/1946 [id number]   Telephone Assessment    Outreach attempt to patient. No answer at present after multiple rings.      Plan: RN CM will make outreach attempt to patient within the month of Sept if no return call from patient.    Antionette Fairy, RN,BSN,CCM [facility] Care Management Telephonic Care Management Coordinator Direct Phone: [phone] Toll Free: [phone] Fax: [phone]

## 2020-02-24 ENCOUNTER — Ambulatory Visit: Payer: Self-pay

## 2020-03-08 ENCOUNTER — Other Ambulatory Visit: Payer: Self-pay | Admitting: Legal Medicine

## 2020-03-08 ENCOUNTER — Other Ambulatory Visit: Payer: Self-pay

## 2020-03-08 MED ORDER — NITROGLYCERIN 0.4 MG SL SUBL: 0.4000 mg | SUBLINGUAL_TABLET | SUBLINGUAL | 0 refills | Status: DC | PRN

## 2020-03-08 NOTE — Telephone Encounter (Signed)
Patient called in stating he was having chest pain yesterday and today, patient took an expired nitrostat and it did not help. Per Dr. Sedalia Muta patient was to go to nearest Emergency Department and I sent in a new of Nitrostat to patient's Pharmacy.

## 2020-03-25 ENCOUNTER — Other Ambulatory Visit: Payer: Self-pay | Admitting: Legal Medicine

## 2020-03-29 ENCOUNTER — Encounter: Payer: Self-pay | Admitting: Legal Medicine

## 2020-03-29 ENCOUNTER — Ambulatory Visit (INDEPENDENT_AMBULATORY_CARE_PROVIDER_SITE_OTHER): Payer: Medicare HMO | Admitting: Legal Medicine

## 2020-03-29 ENCOUNTER — Other Ambulatory Visit: Payer: Self-pay

## 2020-03-29 VITALS — BP 124/70 | HR 71 | Temp 97.3°F | Resp 16 | Ht 67.0 in | Wt 201.8 lb

## 2020-03-29 DIAGNOSIS — J209 Acute bronchitis, unspecified: Secondary | ICD-10-CM | POA: Insufficient documentation

## 2020-03-29 DIAGNOSIS — I10 Essential (primary) hypertension: Secondary | ICD-10-CM | POA: Diagnosis not present

## 2020-03-29 DIAGNOSIS — Z6832 Body mass index (BMI) 32.0-32.9, adult: Secondary | ICD-10-CM | POA: Insufficient documentation

## 2020-03-29 DIAGNOSIS — Z6831 Body mass index (BMI) 31.0-31.9, adult: Secondary | ICD-10-CM

## 2020-03-29 DIAGNOSIS — E782 Mixed hyperlipidemia: Secondary | ICD-10-CM

## 2020-03-29 DIAGNOSIS — E1159 Type 2 diabetes mellitus with other circulatory complications: Secondary | ICD-10-CM

## 2020-03-29 DIAGNOSIS — Z6830 Body mass index (BMI) 30.0-30.9, adult: Secondary | ICD-10-CM | POA: Insufficient documentation

## 2020-03-29 DIAGNOSIS — J440 Chronic obstructive pulmonary disease with acute lower respiratory infection: Secondary | ICD-10-CM | POA: Diagnosis not present

## 2020-03-29 HISTORY — DX: Body mass index (BMI) 31.0-31.9, adult: Z68.31

## 2020-03-29 HISTORY — DX: Body mass index (BMI) 32.0-32.9, adult: Z68.32

## 2020-03-29 LAB — POCT UA - MICROALBUMIN: Microalbumin Ur, POC: 10 mg/L

## 2020-03-29 NOTE — Progress Notes (Signed)
Subjective:  Patient ID: Mark [name], male    DOB: 07-08-1946  Age: 74 y.o. MRN: [id number]  Chief Complaint  Patient presents with  . Hyperlipidemia  . Hypertension  . Diabetes    HPI: chronic visit  Patient present with type 2 diabetes.  Specifically, this is type 2, none requiring diabetes, complicated by renal failure and vascular disease.  Compliance with treatment has been good; patient take medicines as directed, maintains diet and exercise regimen, follows up as directed, and is keeping glucose diary.  Date of  diagnosis 2010.  Depression screen has been performed.Tobacco screen nonsmoker. Current medicines for diabetes metformin.  Patient is on none due to renal disesase for renal protection and atorvastatin for cholesterol control.  Patient performs foot exams daily and last ophthalmologic exam was none.  Patient presents for follow up of hypertension.  Patient tolerating carvedilol well with side effects.  Patient was diagnosed with hypertension 2010 so has been treated for hypertension for 10 years.Patient is working on maintaining diet and exercise regimen and follows up as directed. Complication include HFpEF.  Patient presents with hyperlipidemia.  Compliance with treatment has been good; patient takes medicines as directed, maintains low cholesterol diet, follows up as directed, and maintains exercise regimen.  Patient is using atorvastatin without problems.   Patient presents with HFpEF  that is stable. Diagnosis made 2017 at [facility].  The course of the disease is stable.  Current medicines include carvidilol furosemide and spironolactone. Patient follows a low cholesterol diet and maintains a weight diary.  Patient is on low salt, low cholesterol diet and avoids alcohol.  Patient denies adverse effects of medicines. Patient is monitoring weight and has gained weight of weight.  Patient is having some pedal edema, no PND and no PND.  Patient is continuing to see  cardiology. Current Outpatient Medications on File Prior to Visit  Medication Sig Dispense Refill  . ACCU-CHEK AVIVA PLUS test strip TEST BLOOD SUGAR QD  3  . ACCU-CHEK SOFTCLIX LANCETS lancets USE TO CHECK BLOOD SUGAR ONCE A DAY  3  . albuterol (VENTOLIN HFA) 108 (90 Base) MCG/ACT inhaler     . aspirin 81 MG chewable tablet Chew by mouth daily.    Marland Kitchen atorvastatin (LIPITOR) 20 MG tablet TAKE 1 TABLET EVERY DAY 90 tablet 2  . carvedilol (COREG) 6.25 MG tablet TAKE 1 TABLET EVERY DAY 90 tablet 2  . cyclobenzaprine (FLEXERIL) 10 MG tablet Take 10 mg by mouth 2 (two) times daily.  1  . diclofenac sodium (VOLTAREN) 1 % GEL Apply 2 g topically 4 (four) times daily.    . diclofenac Sodium (VOLTAREN) 1 % GEL APPLY 2 GRAMS EXTERNALLY TO THE AFFECTED AREA FOUR TIMES DAILY 200 g 6  . furosemide (LASIX) 20 MG tablet Take 20 mg by mouth daily.    . Magnesium 400 MG TABS Take 1 tablet by mouth daily.    . metFORMIN (GLUCOPHAGE) 500 MG tablet TAKE 1/2 TABLET TWICE DAILY 90 tablet 2  . nitroGLYCERIN (NITROSTAT) 0.4 MG SL tablet DISSOLVE ONE TABLET UNDER TONGUE AS NEEDED FOR CHEST PAIN UP TO 3 TABLETS 100 tablet 5  . spironolactone (ALDACTONE) 25 MG tablet TAKE 1/2 TABLET EVERY DAY 45 tablet 2  . triamcinolone cream (KENALOG) 0.1 % APPLY A THIN LAYER TO AFFECTED AREAS 2 TIMES DAILY  3   No current facility-administered medications on file prior to visit.   Past Medical History:  Diagnosis Date  . Arthritis   . Chronic diastolic  heart failure (HCC)   . DVT (deep venous thrombosis) (HCC)   . Essential hypertension   . History of BPH   . Peripheral vascular disease [facility])    Past Surgical History:  Procedure Laterality Date  . HERNIA REPAIR    . INGUINAL HERNIA REPAIR Bilateral     History reviewed. No pertinent family history. Social History   Socioeconomic History  . Marital status: Single    Spouse name: Not on file  . Number of children: Not on file  . Years of education: Not on file  .  Highest education level: Not on file  Occupational History  . Not on file  Tobacco Use  . Smoking status: Never Smoker  . Smokeless tobacco: Never Used  Vaping Use  . Vaping Use: Never used  Substance and Sexual Activity  . Alcohol use: No  . Drug use: No  . Sexual activity: Not on file  Other Topics Concern  . Not on file  Social History Narrative  . Not on file   Social Determinants of Health   Financial Resource Strain:   . Difficulty of Paying Living Expenses:   Food Insecurity: Food Insecurity Present  . Worried About Programme researcher, broadcasting/film/video in the Last Year: Sometimes true  . Ran Out of Food in the Last Year: Sometimes true  Transportation Needs: No Transportation Needs  . Lack of Transportation (Medical): No  . Lack of Transportation (Non-Medical): No  Physical Activity:   . Days of Exercise per Week:   . Minutes of Exercise per Session:   Stress:   . Feeling of Stress :   Social Connections:   . Frequency of Communication with Friends and Family:   . Frequency of Social Gatherings with Friends and Family:   . Attends Religious Services:   . Active Member of Clubs or Organizations:   . Attends Banker Meetings:   Marland Kitchen Marital Status:     Review of Systems  Constitutional: Negative.   HENT: Negative.   Respiratory: Negative.   Cardiovascular: Positive for chest pain and leg swelling.  Gastrointestinal: Negative.   Endocrine: Negative.   Genitourinary: Negative.   Musculoskeletal: Positive for arthralgias.  Skin: Negative.   Neurological: Negative.   Psychiatric/Behavioral: Negative.      Objective:  BP 124/70   Pulse 71   Temp (!) 97.3 F (36.3 C)   Resp 16   Ht 5\' 7"  (1.702 m)   Wt 201 lb 12.8 oz (91.5 kg)   SpO2 96%   BMI 31.61 kg/m   BP/Weight 03/29/2020 11/27/2019 03/28/2018  Systolic BP 124 128 130  Diastolic BP 70 70 70  Wt. (Lbs) 201.8 206.4 201  BMI 31.61 32.33 31.48    Physical Exam Vitals reviewed.  Constitutional:       Appearance: Normal appearance.  HENT:     Head: Normocephalic and atraumatic.     Right Ear: Tympanic membrane, ear canal and external ear normal.     Left Ear: Tympanic membrane, ear canal and external ear normal.     Nose: Nose normal.     Mouth/Throat:     Mouth: Mucous membranes are moist.  Eyes:     Extraocular Movements: Extraocular movements intact.     Conjunctiva/sclera: Conjunctivae normal.     Pupils: Pupils are equal, round, and reactive to light.  Cardiovascular:     Rate and Rhythm: Regular rhythm.     Heart sounds: Normal heart sounds.  Pulmonary:  Effort: Pulmonary effort is normal.     Breath sounds: Normal breath sounds.  Abdominal:     General: Abdomen is flat. Bowel sounds are normal.     Palpations: Abdomen is soft.  Musculoskeletal:        General: Normal range of motion.     Cervical back: Normal range of motion and neck supple.  Skin:    General: Skin is warm and dry.     Capillary Refill: Capillary refill takes less than 2 seconds.  Neurological:     General: No focal deficit present.     Mental Status: He is alert and oriented to [place], place, and time. Mental status is at baseline.  Psychiatric:        Mood and Affect: Mood normal.        Thought Content: Thought content normal.        Judgment: Judgment normal.     Diabetic Foot Exam - Simple   Simple Foot Form Diabetic Foot exam was performed with the following findings: Yes 03/29/2020 12:47 PM  Visual Inspection No deformities, no ulcerations, no other skin breakdown bilaterally: Yes Sensation Testing Intact to touch and monofilament testing bilaterally: Yes Pulse Check See comments: Yes Comments Decreased sensation to monofilament      Lab Results  Component Value Date   WBC 4.5 11/27/2019   HGB 13.4 11/27/2019   HCT 40.6 11/27/2019   PLT 212 11/27/2019   GLUCOSE 122 (H) 11/27/2019   CHOL 140 11/27/2019   TRIG 123 11/27/2019   HDL 38 (L) 11/27/2019   LDLCALC 80 11/27/2019     ALT 14 11/27/2019   AST 21 11/27/2019   NA 145 (H) 11/27/2019   K 4.5 11/27/2019   CL 108 (H) 11/27/2019   CREATININE 1.16 11/27/2019   BUN 16 11/27/2019   CO2 24 11/27/2019   HGBA1C 6.2 (H) 11/27/2019   MICROALBUR 10 03/29/2020      Assessment & Plan:   1. Type 2 diabetes mellitus with vascular disease (HCC) - Ambulatory referral to Ophthalmology - Hemoglobin A1c - POCT UA - Microalbumin An individual care plan for diabetes was established and reinforced today.  The patient's status was assessed using clinical findings on exam, labs and diagnostic testing. Patient success at meeting goals based on disease specific evidence-based guidelines and found to be good controlled. Medications were assessed and patient's understanding of the medical issues , including barriers were assessed. Recommend adherence to a diabetic diet, a graduated exercise program, HgbA1c level is checked quarterly, and urine microalbumin performed yearly .  Annual mono-filament sensation testing performed. Lower blood pressure and control hyperlipidemia is important. Get annual eye exams and annual flu shots and smoking cessation discussed.  Self management goals were discussed.  2. Essential hypertension - CBC with Differential/Platelet - Comprehensive metabolic panel An individual hypertension care plan was established and reinforced today.  The patient's status was assessed using clinical findings on exam and labs or diagnostic tests. The patient's success at meeting treatment goals on disease specific evidence-based guidelines and found to be well controlled. SELF MANAGEMENT: The patient and I together assessed ways to personally work towards obtaining the recommended goals. RECOMMENDATIONS: avoid decongestants found in common cold remedies, decrease consumption of alcohol, perform routine monitoring of BP with home BP cuff, exercise, reduction of dietary salt, take medicines as prescribed, try not to miss  doses and quit smoking.  Regular exercise and maintaining a healthy weight is needed.  Stress reduction may help. A CLINICAL  SUMMARY including written plan identify barriers to care unique to individual due to social or financial issues.  We attempt to mutually creat solutions for individual and family understanding.  3. Mixed hyperlipidemia - Lipid panel AN INDIVIDUAL CARE PLAN for hyperlipidemia/ cholesterol was established and reinforced today.  The patient's status was assessed using clinical findings on exam, lab and other diagnostic tests. The patient's disease status was assessed based on evidence-based guidelines and found to be well controlled. MEDICATIONS were reviewed. SELF MANAGEMENT GOALS have been discussed and patient's success at attaining the goal of low cholesterol was assessed. RECOMMENDATION given include regular exercise 3 days a week and low cholesterol/low fat diet. CLINICAL SUMMARY including written plan to identify barriers unique to the patient due to social or economic  reasons was discussed.  4. Acute bronchitis with COPD (HCC) An individualize plan was formulated for care of COPD.  Treatment is evidence based.  She will continue on inhalers, avoid smoking and smoke.  Regular exercise with help with dyspnea. Routine follow ups and medication compliance is needed.  5. BMI 31.0-31.9,adult An individualize plan was formulated for obesity using patient history and physical exam to encourage weight loss.  An evidence based program was formulated.  Patient is to cut portion size with meals and to plan physical exercise 3 days a week at least 20 minutes.  Weight watchers and other programs are helpful.  Planned amount of weight loss 10 lbs.      Orders Placed This Encounter  Procedures  . CBC with Differential/Platelet  . Comprehensive metabolic panel  . Lipid panel  . Hemoglobin A1c  . Ambulatory referral to Ophthalmology  . POCT UA - Microalbumin     Follow-up:  Return in about 4 months (around 07/29/2020) for fasting.  An After Visit Summary was printed and given to the patient.  Brent Bulla [facility] [phone]

## 2020-03-30 LAB — COMPREHENSIVE METABOLIC PANEL
ALT: 18 IU/L (ref 0–44)
AST: 19 IU/L (ref 0–40)
Albumin/Globulin Ratio: 1.6 (ref 1.2–2.2)
Albumin: 4.5 g/dL (ref 3.7–4.7)
Alkaline Phosphatase: 66 IU/L (ref 48–121)
BUN/Creatinine Ratio: 14 (ref 10–24)
BUN: 21 mg/dL (ref 8–27)
Bilirubin Total: 0.6 mg/dL (ref 0.0–1.2)
CO2: 24 mmol/L (ref 20–29)
Calcium: 10 mg/dL (ref 8.6–10.2)
Chloride: 106 mmol/L (ref 96–106)
Creatinine, Ser: 1.45 mg/dL — ABNORMAL HIGH (ref 0.76–1.27)
GFR calc Af Amer: 54 mL/min/{1.73_m2} — ABNORMAL LOW (ref >59)
GFR calc non Af Amer: 47 mL/min/{1.73_m2} — ABNORMAL LOW (ref >59)
Globulin, Total: 2.9 g/dL (ref 1.5–4.5)
Glucose: 105 mg/dL — ABNORMAL HIGH (ref 65–99)
Potassium: 4.5 mmol/L (ref 3.5–5.2)
Sodium: 144 mmol/L (ref 134–144)
Total Protein: 7.4 g/dL (ref 6.0–8.5)

## 2020-03-30 LAB — CBC WITH DIFFERENTIAL/PLATELET
Basophils Absolute: 0 10*3/uL (ref 0.0–0.2)
Basos: 0 %
EOS (ABSOLUTE): 0.1 10*3/uL (ref 0.0–0.4)
Eos: 3 %
Hematocrit: 44.4 % (ref 37.5–51.0)
Hemoglobin: 14.8 g/dL (ref 13.0–17.7)
Immature Grans (Abs): 0 10*3/uL (ref 0.0–0.1)
Immature Granulocytes: 0 %
Lymphocytes Absolute: 2.3 10*3/uL (ref 0.7–3.1)
Lymphs: 47 %
MCH: 31.9 pg (ref 26.6–33.0)
MCHC: 33.3 g/dL (ref 31.5–35.7)
MCV: 96 fL (ref 79–97)
Monocytes Absolute: 0.6 10*3/uL (ref 0.1–0.9)
Monocytes: 11 %
Neutrophils Absolute: 2 10*3/uL (ref 1.4–7.0)
Neutrophils: 39 %
Platelets: 199 10*3/uL (ref 150–450)
RBC: 4.64 x10E6/uL (ref 4.14–5.80)
RDW: 13.4 % (ref 11.6–15.4)
WBC: 5 10*3/uL (ref 3.4–10.8)

## 2020-03-30 LAB — LIPID PANEL
Chol/HDL Ratio: 3.1 ratio (ref 0.0–5.0)
Cholesterol, Total: 143 mg/dL (ref 100–199)
HDL: 46 mg/dL (ref >39)
LDL Chol Calc (NIH): 80 mg/dL (ref 0–99)
Triglycerides: 89 mg/dL (ref 0–149)
VLDL Cholesterol Cal: 17 mg/dL (ref 5–40)

## 2020-03-30 LAB — HEMOGLOBIN A1C
Est. average glucose Bld gHb Est-mCnc: 137 mg/dL
Hgb A1c MFr Bld: 6.4 % — ABNORMAL HIGH (ref 4.8–5.6)

## 2020-03-30 LAB — CARDIOVASCULAR RISK ASSESSMENT

## 2020-03-30 NOTE — Progress Notes (Signed)
CBC normal, glucose 105, liver tests high stage 3, was normal 4 months ago recheck in one week, drink plenty of fluids, Cholesterol normal, A1c 6.4 lp

## 2020-03-31 DIAGNOSIS — H2513 Age-related nuclear cataract, bilateral: Secondary | ICD-10-CM | POA: Diagnosis not present

## 2020-03-31 DIAGNOSIS — E119 Type 2 diabetes mellitus without complications: Secondary | ICD-10-CM | POA: Diagnosis not present

## 2020-04-02 ENCOUNTER — Other Ambulatory Visit: Payer: Self-pay | Admitting: Legal Medicine

## 2020-04-05 ENCOUNTER — Encounter: Payer: Self-pay | Admitting: Legal Medicine

## 2020-04-05 ENCOUNTER — Other Ambulatory Visit: Payer: Medicare HMO

## 2020-04-05 ENCOUNTER — Other Ambulatory Visit: Payer: Self-pay

## 2020-04-05 DIAGNOSIS — N179 Acute kidney failure, unspecified: Secondary | ICD-10-CM | POA: Diagnosis not present

## 2020-04-06 LAB — COMPREHENSIVE METABOLIC PANEL
ALT: 19 IU/L (ref 0–44)
AST: 19 IU/L (ref 0–40)
Albumin/Globulin Ratio: 1.4 (ref 1.2–2.2)
Albumin: 3.9 g/dL (ref 3.7–4.7)
Alkaline Phosphatase: 75 IU/L (ref 48–121)
BUN/Creatinine Ratio: 14 (ref 10–24)
BUN: 15 mg/dL (ref 8–27)
Bilirubin Total: 0.6 mg/dL (ref 0.0–1.2)
CO2: 25 mmol/L (ref 20–29)
Calcium: 9.3 mg/dL (ref 8.6–10.2)
Chloride: 104 mmol/L (ref 96–106)
Creatinine, Ser: 1.06 mg/dL (ref 0.76–1.27)
GFR calc Af Amer: 80 mL/min/{1.73_m2} (ref >59)
GFR calc non Af Amer: 69 mL/min/{1.73_m2} (ref >59)
Globulin, Total: 2.8 g/dL (ref 1.5–4.5)
Glucose: 109 mg/dL — ABNORMAL HIGH (ref 65–99)
Potassium: 4.3 mmol/L (ref 3.5–5.2)
Sodium: 141 mmol/L (ref 134–144)
Total Protein: 6.7 g/dL (ref 6.0–8.5)

## 2020-04-06 NOTE — Progress Notes (Signed)
Glucose 109, kidney and liver tests normal, potassium normal 4.3 lp

## 2020-04-18 ENCOUNTER — Encounter (HOSPITAL_COMMUNITY): Payer: Self-pay | Admitting: Gynecology

## 2020-04-18 ENCOUNTER — Ambulatory Visit (HOSPITAL_COMMUNITY)
Admission: EM | Admit: 2020-04-18 | Discharge: 2020-04-18 | Disposition: A | Discharge status: Home / Self Care | Payer: Medicare HMO | Attending: Family Medicine | Admitting: Family Medicine

## 2020-04-18 ENCOUNTER — Other Ambulatory Visit: Payer: Self-pay

## 2020-04-18 DIAGNOSIS — M545 Low back pain, unspecified: Secondary | ICD-10-CM

## 2020-04-18 DIAGNOSIS — M542 Cervicalgia: Secondary | ICD-10-CM | POA: Diagnosis not present

## 2020-04-18 DIAGNOSIS — M25511 Pain in right shoulder: Secondary | ICD-10-CM

## 2020-04-18 DIAGNOSIS — S39012A Strain of muscle, fascia and tendon of lower back, initial encounter: Secondary | ICD-10-CM

## 2020-04-18 MED ORDER — TIZANIDINE HCL 4 MG PO TABS: 4.0000 mg | ORAL_TABLET | Freq: Four times a day (QID) | ORAL | 0 refills | Status: DC | PRN

## 2020-04-18 MED ORDER — ACETAMINOPHEN-CODEINE #3 300-30 MG PO TABS: 1.0000 | ORAL_TABLET | Freq: Four times a day (QID) | ORAL | 0 refills | Status: DC | PRN

## 2020-04-18 NOTE — Discharge Instructions (Signed)
Take tylenol 3 as needed for your pain.    Take the muscle relaxer tizanidine as needed for muscle spasm; Do not drive, operate machinery, or drink alcohol with this medication as it may make you drowsy.    Follow up with your primary care provider or an orthopedist if your pain is not improving.
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
## 2020-04-23 ENCOUNTER — Other Ambulatory Visit: Payer: Self-pay

## 2020-04-23 NOTE — Patient Outreach (Signed)
[facility]) Care Management  04/23/2020  Mark [name] March 19, 1946 [id number]   Telephone Assessment    Outreach attempt #1 to patient. No answer after multiple rings and unable to leave message.       Plan:  RN CM will make outreach attempt to patient within the month of Oct.  [name] [name] [facility] Care Management Telephonic Care Management Coordinator Direct Phone: [phone] Toll Free: [phone] Fax: [phone]

## 2020-05-11 ENCOUNTER — Other Ambulatory Visit: Payer: Self-pay

## 2020-05-11 DIAGNOSIS — E1159 Type 2 diabetes mellitus with other circulatory complications: Secondary | ICD-10-CM

## 2020-05-11 MED ORDER — ACCU-CHEK AVIVA PLUS W/DEVICE KIT: 1.0000 | PACK | Freq: Every morning | 0 refills | Status: DC

## 2020-05-11 MED ORDER — ACCU-CHEK AVIVA PLUS VI STRP: ORAL_STRIP | 3 refills | Status: DC

## 2020-05-11 MED ORDER — ACCU-CHEK SOFTCLIX LANCETS MISC
3 refills | Status: DC
Start: 2020-05-11 — End: 2021-04-20

## 2020-05-20 ENCOUNTER — Other Ambulatory Visit: Payer: Self-pay

## 2020-05-20 NOTE — Patient Outreach (Signed)
[facility]) Care Management  05/20/2020  Mark [name] February 01, 1946 [id number]   Telephone Assessment   Outreach attempt #1 to patient. No answer. RN CM left HIPAA compliant voicemail message along with contact info.     Plan: RN CM will make quarterly outreach attempt to patient within the month of Dec if no return call from patient. RN CM will send unsuccessful outreach letter to patient.   Antionette Fairy, RN,BSN,CCM [facility] Care Management Telephonic Care Management Coordinator Direct Phone: [phone] Toll Free: [phone] Fax: [phone]

## 2020-05-27 ENCOUNTER — Ambulatory Visit: Payer: Self-pay

## 2020-06-17 ENCOUNTER — Other Ambulatory Visit: Payer: Self-pay

## 2020-06-17 NOTE — Patient Outreach (Signed)
[facility]) Care Management  06/17/2020  Mark [name] Aug 07, 1946 [id number]   Telephone Assessment   Unsuccessful outreach attempt to patient. Spoke with significant other who reported patient was not available at present.     Plan: RN CM will send unsuccessful outreach letter to patient. RN CM will make quarterly outreach attempt to patient within the month of Jan if no return call from patient.   Antionette Fairy, RN,BSN,CCM [facility] Care Management Telephonic Care Management Coordinator Direct Phone: [phone] Toll Free: [phone] Fax: [phone]

## 2020-07-20 ENCOUNTER — Other Ambulatory Visit: Payer: Self-pay

## 2020-07-20 ENCOUNTER — Ambulatory Visit (INDEPENDENT_AMBULATORY_CARE_PROVIDER_SITE_OTHER): Payer: Medicare HMO | Admitting: Legal Medicine

## 2020-07-20 ENCOUNTER — Encounter: Payer: Self-pay | Admitting: Legal Medicine

## 2020-07-20 VITALS — BP 128/68 | HR 66 | Temp 97.1°F | Resp 17 | Ht 67.0 in | Wt 199.0 lb

## 2020-07-20 DIAGNOSIS — Z0000 Encounter for general adult medical examination without abnormal findings: Secondary | ICD-10-CM | POA: Insufficient documentation

## 2020-07-20 DIAGNOSIS — Z23 Encounter for immunization: Secondary | ICD-10-CM

## 2020-07-20 NOTE — Progress Notes (Signed)
Subjective:  Patient ID: Mark [name], male    DOB: 02-May-1946  Age: 74 y.o. MRN: [id number]  Chief Complaint  Patient presents with  . Annual Exam    AWV    HPI Encounter for general adult medical examination without abnormal findings  Physical ("At Risk" items are starred): Patient's last physical exam was 1 year ago .   Growth percentile SmartLinks can only be used for patients less than 62 years old.   SDOH Screenings   Alcohol Screen: Low Risk   . Last Alcohol Screening Score (AUDIT): 0  Depression (PHQ2-9): Low Risk   . PHQ-2 Score: 0  Financial Resource Strain: Low Risk   . Difficulty of Paying Living Expenses: Not hard at all  Food Insecurity: No Food Insecurity  . Worried About Charity fundraiser in the Last Year: Never true  . Ran Out of Food in the Last Year: Never true  Housing: Low Risk   . Last Housing Risk Score: 0  Physical Activity: Sufficiently Active  . Days of Exercise per Week: 5 days  . Minutes of Exercise per Session: 150+ min  Social Connections: Moderately Integrated  . Frequency of Communication with Friends and Family: Once a week  . Frequency of Social Gatherings with Friends and Family: More than three times a week  . Attends Religious Services: 1 to 4 times per year  . Active Member of Clubs or Organizations: No  . Attends Archivist Meetings: Never  . Marital Status: Married  Stress: No Stress Concern Present  . Feeling of Stress : Not at all  Tobacco Use: Low Risk   . Smoking Tobacco Use: Never Smoker  . Smokeless Tobacco Use: Never Used  Transportation Needs: No Transportation Needs  . Lack of Transportation (Medical): No  . Lack of Transportation (Non-Medical): No    Fall Risk  07/20/2020 12/22/2019 11/27/2019 10/16/2019 06/09/2019  Falls in the past year? 0 1 - 1 1  Comment - - - - -  Number falls in past yr: 0 0 0 0 1  Injury with Fall? 0 0 0 1 1  Comment - - - - -  Risk Factor Category  - - - - -  Risk for fall due  to : No Fall Risks History of fall(s);Medication side effect;Impaired balance/gait - History of fall(s);Impaired balance/gait;Medication side effect History of fall(s);Impaired balance/gait;Medication side effect;Impaired mobility;Impaired vision  Follow up - Education provided;Falls evaluation completed Falls evaluation completed Education provided;Falls evaluation completed Education provided;Falls evaluation completed    Depression screen [facility] 2/9 07/20/2020 11/27/2019 06/09/2019 02/12/2019 05/17/2018  Decreased Interest 0 0 0 0 0  Down, Depressed, Hopeless 0 0 0 1 0  PHQ - 2 Score 0 0 0 1 0    Functional Status Survey: Is the patient deaf or have difficulty hearing?: No Does the patient have difficulty seeing, even when wearing glasses/contacts?: No Does the patient have difficulty concentrating, remembering, or making decisions?: No Does the patient have difficulty walking or climbing stairs?: Yes Does the patient have difficulty dressing or bathing?: No Does the patient have difficulty doing errands alone such as visiting a doctor's office or shopping?: No   Safety: reviewed ;  Patient wears a seat belt. Patient's home has smoke detectors and carbon monoxide detectors. Patient practices appropriate gun safety Patient wears sunscreen with extended sun exposure. Dental Care: biannual cleanings, brushes and flosses daily. Ophthalmology/Optometry: Annual visit.  Hearing loss: none Vision impairments: none Patient is not afflicted from  Stress Incontinence and Urge Incontinence   Current Outpatient Medications on File Prior to Visit  Medication Sig Dispense Refill  . ACCU-CHEK AVIVA PLUS test strip TEST BLOOD SUGAR QD 100 each 3  . Accu-Chek Softclix Lancets lancets USE TO CHECK BLOOD SUGAR ONCE A DAY 100 each 3  . acetaminophen-codeine (TYLENOL #3) 300-30 MG tablet Take 1 tablet by mouth every 6 (six) hours as needed for moderate pain or severe pain. 15 tablet 0  . albuterol (VENTOLIN HFA)  108 (90 Base) MCG/ACT inhaler     . aspirin 81 MG chewable tablet Chew by mouth daily.    Marland Kitchen atorvastatin (LIPITOR) 20 MG tablet TAKE 1 TABLET EVERY DAY 90 tablet 2  . Blood Glucose Monitoring Suppl (ACCU-CHEK AVIVA PLUS) w/Device KIT 1 each by Does not apply route every morning. 1 kit 0  . carvedilol (COREG) 6.25 MG tablet TAKE 1 TABLET EVERY DAY 90 tablet 2  . cyclobenzaprine (FLEXERIL) 10 MG tablet Take 10 mg by mouth 2 (two) times daily.  1  . diclofenac sodium (VOLTAREN) 1 % GEL Apply 2 g topically 4 (four) times daily.    . diclofenac Sodium (VOLTAREN) 1 % GEL APPLY 2 GRAMS EXTERNALLY TO THE AFFECTED AREA FOUR TIMES DAILY 200 g 6  . furosemide (LASIX) 20 MG tablet Take 20 mg by mouth daily.    . Magnesium 400 MG TABS Take 1 tablet by mouth daily.    . metFORMIN (GLUCOPHAGE) 500 MG tablet TAKE 1/2 TABLET TWICE DAILY 90 tablet 2  . nitroGLYCERIN (NITROSTAT) 0.4 MG SL tablet DISSOLVE ONE TABLET UNDER TONGUE AS NEEDED FOR CHEST PAIN UP TO 3 TABLETS 100 tablet 5  . spironolactone (ALDACTONE) 25 MG tablet TAKE 1/2 TABLET EVERY DAY 45 tablet 2  . tiZANidine (ZANAFLEX) 4 MG tablet Take 1 tablet (4 mg total) by mouth every 6 (six) hours as needed for muscle spasms. 30 tablet 0   No current facility-administered medications on file prior to visit.    Social Hx   Social History   Socioeconomic History  . Marital status: Single    Spouse name: Not on file  . Number of children: 4  . Years of education: 14  . Highest education level: 12th grade  Occupational History  . Occupation: retired   Tobacco Use  . Smoking status: Never Smoker  . Smokeless tobacco: Never Used  Vaping Use  . Vaping Use: Never used  Substance and Sexual Activity  . Alcohol use: No  . Drug use: No  . Sexual activity: Not on file  Other Topics Concern  . Not on file  Social History Narrative  . Not on file   Social Determinants of Health   Financial Resource Strain: Low Risk   . Difficulty of Paying Living  Expenses: Not hard at all  Food Insecurity: No Food Insecurity  . Worried About Charity fundraiser in the Last Year: Never true  . Ran Out of Food in the Last Year: Never true  Transportation Needs: No Transportation Needs  . Lack of Transportation (Medical): No  . Lack of Transportation (Non-Medical): No  Physical Activity: Sufficiently Active  . Days of Exercise per Week: 5 days  . Minutes of Exercise per Session: 150+ min  Stress: No Stress Concern Present  . Feeling of Stress : Not at all  Social Connections: Moderately Integrated  . Frequency of Communication with Friends and Family: Once a week  . Frequency of Social Gatherings with Friends and Family: More  than three times a week  . Attends Religious Services: 1 to 4 times per year  . Active Member of Clubs or Organizations: No  . Attends Archivist Meetings: Never  . Marital Status: Married   Past Medical History:  Diagnosis Date  . Arthritis   . Chronic diastolic heart failure ([place])   . DVT (deep venous thrombosis) ([place])   . Essential hypertension   . History of BPH   . Peripheral vascular disease ([place])    History reviewed. No pertinent family history.  Review of Systems  Constitutional: Negative for activity change, appetite change and fever.  HENT: Negative for congestion and sinus pressure.   Eyes: Positive for visual disturbance.  Respiratory: Negative for cough, choking and shortness of breath.   Cardiovascular: Negative for chest pain, palpitations and leg swelling.  Gastrointestinal: Negative for abdominal distention, diarrhea, nausea and vomiting.  Endocrine: Negative for polyuria.  Genitourinary: Positive for flank pain. Negative for difficulty urinating and dysuria.  Musculoskeletal: Positive for arthralgias. Negative for back pain, gait problem and neck stiffness.  Skin: Negative for rash.  Neurological: Positive for numbness. Negative for dizziness and facial asymmetry.    Psychiatric/Behavioral: Negative for behavioral problems and confusion.     Objective:  BP 128/68 (BP Location: Left Arm, Patient Position: Sitting, Cuff Size: Normal)   Pulse 66   Temp (!) 97.1 F (36.2 C) (Temporal)   Resp 17   Ht $R'5\' 7"'GU$  (1.702 m)   Wt 199 lb (90.3 kg)   SpO2 96%   BMI 31.17 kg/m   BP/Weight 07/20/2020 04/18/2020 7/82/4235  Systolic BP 361 443 154  Diastolic BP 68 67 70  Wt. (Lbs) 199 196 201.8  BMI 31.17 30.7 31.61    Physical Exam Vitals reviewed.  Cardiovascular:     Rate and Rhythm: Normal rate and regular rhythm.     Pulses: Normal pulses.     Heart sounds: Normal heart sounds.  Pulmonary:     Effort: Pulmonary effort is normal.     Breath sounds: Normal breath sounds.     Lab Results  Component Value Date   WBC 5.0 03/29/2020   HGB 14.8 03/29/2020   HCT 44.4 03/29/2020   PLT 199 03/29/2020   GLUCOSE 109 (H) 04/05/2020   CHOL 143 03/29/2020   TRIG 89 03/29/2020   HDL 46 03/29/2020   LDLCALC 80 03/29/2020   ALT 19 04/05/2020   AST 19 04/05/2020   NA 141 04/05/2020   K 4.3 04/05/2020   CL 104 04/05/2020   CREATININE 1.06 04/05/2020   BUN 15 04/05/2020   CO2 25 04/05/2020   HGBA1C 6.4 (H) 03/29/2020   MICROALBUR 10 03/29/2020      Assessment & Plan:  1. Routine general medical examination at a health care facility AWV performed and immunizations updated.   2. Need for immunization against influenza - Flu Vaccine QUAD High Dose(Fluad)      These are the goals we discussed: Goals    . DIET - REDUCE SODIUM INTAKE        This is a list of the screening recommended for you and due dates:  Health Maintenance  Topic Date Due  .  Hepatitis C: One time screening is recommended by Center for Disease Control  (CDC) for  adults born from [birth year] through 1965.   Never done  . COVID-19 Vaccine (1) Never done  . Tetanus Vaccine  Never done  . Hemoglobin A1C  09/29/2020  . Complete foot exam  03/29/2021  . Urine Protein Check   03/29/2021  . Eye exam for diabetics  03/31/2021  . Colon Cancer Screening  05/29/2022  . Flu Shot  Completed  . Pneumonia vaccines  Completed      AN INDIVIDUALIZED CARE PLAN: was established or reinforced today.   SELF MANAGEMENT: The patient and I together assessed ways to personally work towards obtaining the recommended goals  Support needs The patient and/or family needs were assessed and services were offered and not necessary at this time.    Follow-up: Return in about 4 months (around 11/18/2020) for fasting.  Reinaldo Meeker, MD [facility] [phone]

## 2020-08-02 ENCOUNTER — Other Ambulatory Visit: Payer: Self-pay | Admitting: Legal Medicine

## 2020-08-09 ENCOUNTER — Encounter: Payer: Self-pay | Admitting: Legal Medicine

## 2020-08-09 ENCOUNTER — Ambulatory Visit (INDEPENDENT_AMBULATORY_CARE_PROVIDER_SITE_OTHER): Payer: Medicare HMO | Admitting: Legal Medicine

## 2020-08-09 ENCOUNTER — Other Ambulatory Visit: Payer: Self-pay

## 2020-08-09 VITALS — BP 110/68 | HR 61 | Temp 97.3°F | Resp 16 | Ht 67.0 in | Wt 202.0 lb

## 2020-08-09 DIAGNOSIS — I25118 Atherosclerotic heart disease of native coronary artery with other forms of angina pectoris: Secondary | ICD-10-CM

## 2020-08-09 DIAGNOSIS — I10 Essential (primary) hypertension: Secondary | ICD-10-CM

## 2020-08-09 DIAGNOSIS — Z6831 Body mass index (BMI) 31.0-31.9, adult: Secondary | ICD-10-CM

## 2020-08-09 DIAGNOSIS — I5032 Chronic diastolic (congestive) heart failure: Secondary | ICD-10-CM | POA: Diagnosis not present

## 2020-08-09 DIAGNOSIS — E782 Mixed hyperlipidemia: Secondary | ICD-10-CM

## 2020-08-09 DIAGNOSIS — E1121 Type 2 diabetes mellitus with diabetic nephropathy: Secondary | ICD-10-CM

## 2020-08-09 NOTE — Progress Notes (Signed)
Subjective:  Patient ID: Mark [name], male    DOB: 01/23/1946  Age: 74 y.o. MRN: [id number]  Chief Complaint  Patient presents with  . Hypertension  . Diabetes    HPI: chronic visit  Patient present with type 2 diabetes.  Specifically, this is type 2, noninsulin requiring diabetes, complicated by nephropathy and vascualr disease.  Compliance with treatment has been good; patient take medicines as directed, maintains diet and exercise regimen, follows up as directed, and is keeping glucose diary.  Date of  diagnosis 2010.  Depression screen has been performed.Tobacco screen nonsmker. Current medicines for diabetes metformin.  Patient is on none for renal protection and atorvastatin for cholesterol control.  Patient performs foot exams daily and last ophthalmologic exam was yes  Patient presents for follow up of hypertension.  Patient tolerating carvidilol well with side effects.  Patient was diagnosed with hypertension 2010 so has been treated for hypertension for 10 years.Patient is working on maintaining diet and exercise regimen and follows up as directed. Complication include cad, CHF  Patient presents with HFpEF  that is stable. Diagnosis made 2020.  The course of the disease is stable.  Current medicines include NTG, spironoactone, caridilol furosemide. Patient follows a low cholesterol diet and maintains a weight diary.  Patient is on low salt, low cholesterol diet and avoids alcohol.  Patient denies adverse effects of medicines. Patient is monitoring weight and has non change of weight.  Patient is having no pedal edema, no PND and no PND.  Patient is continuing to see cardiology...   Current Outpatient Medications on File Prior to Visit  Medication Sig Dispense Refill  . ACCU-CHEK AVIVA PLUS test strip TEST BLOOD SUGAR QD 100 each 3  . Accu-Chek Softclix Lancets lancets USE TO CHECK BLOOD SUGAR ONCE A DAY 100 each 3  . acetaminophen-codeine (TYLENOL #3) 300-30 MG tablet Take 1  tablet by mouth every 6 (six) hours as needed for moderate pain or severe pain. 15 tablet 0  . albuterol (VENTOLIN HFA) 108 (90 Base) MCG/ACT inhaler     . aspirin 81 MG chewable tablet Chew by mouth daily.    Marland Kitchen atorvastatin (LIPITOR) 20 MG tablet TAKE 1 TABLET EVERY DAY 90 tablet 2  . Blood Glucose Monitoring Suppl (ACCU-CHEK AVIVA PLUS) w/Device KIT 1 each by Does not apply route every morning. 1 kit 0  . carvedilol (COREG) 6.25 MG tablet TAKE 1 TABLET EVERY DAY 90 tablet 2  . diclofenac Sodium (VOLTAREN) 1 % GEL APPLY 2 GRAMS EXTERNALLY TO THE AFFECTED AREA FOUR TIMES DAILY 200 g 6  . furosemide (LASIX) 20 MG tablet TAKE 1/2 TABLET EVERY DAY 45 tablet 2  . Magnesium 400 MG TABS Take 1 tablet by mouth daily.    . metFORMIN (GLUCOPHAGE) 500 MG tablet TAKE 1/2 TABLET TWICE DAILY 90 tablet 2  . nitroGLYCERIN (NITROSTAT) 0.4 MG SL tablet DISSOLVE ONE TABLET UNDER TONGUE AS NEEDED FOR CHEST PAIN UP TO 3 TABLETS 100 tablet 5  . spironolactone (ALDACTONE) 25 MG tablet TAKE 1/2 TABLET EVERY DAY 45 tablet 2   No current facility-administered medications on file prior to visit.   Past Medical History:  Diagnosis Date  . Arthritis   . Chronic diastolic heart failure ([place])   . DVT (deep venous thrombosis) ([place])   . Essential hypertension   . History of BPH   . Peripheral vascular disease [facility])    Past Surgical History:  Procedure Laterality Date  . HERNIA REPAIR    .  INGUINAL HERNIA REPAIR Bilateral     History reviewed. No pertinent family history. Social History   Socioeconomic History  . Marital status: Single    Spouse name: Not on file  . Number of children: 4  . Years of education: 81  . Highest education level: 12th grade  Occupational History  . Occupation: retired   Tobacco Use  . Smoking status: Never Smoker  . Smokeless tobacco: Never Used  Vaping Use  . Vaping Use: Never used  Substance and Sexual Activity  . Alcohol use: No  . Drug use: No  . Sexual activity: Not on  file  Other Topics Concern  . Not on file  Social History Narrative  . Not on file   Social Determinants of Health   Financial Resource Strain: Low Risk   . Difficulty of Paying Living Expenses: Not hard at all  Food Insecurity: No Food Insecurity  . Worried About Charity fundraiser in the Last Year: Never true  . Ran Out of Food in the Last Year: Never true  Transportation Needs: No Transportation Needs  . Lack of Transportation (Medical): No  . Lack of Transportation (Non-Medical): No  Physical Activity: Sufficiently Active  . Days of Exercise per Week: 5 days  . Minutes of Exercise per Session: 150+ min  Stress: No Stress Concern Present  . Feeling of Stress : Not at all  Social Connections: Moderately Integrated  . Frequency of Communication with Friends and Family: Once a week  . Frequency of Social Gatherings with Friends and Family: More than three times a week  . Attends Religious Services: 1 to 4 times per year  . Active Member of Clubs or Organizations: No  . Attends Archivist Meetings: Never  . Marital Status: Married    Review of Systems  Constitutional: Negative for activity change, appetite change, fatigue and unexpected weight change.  HENT: Negative for congestion, hearing loss and sinus pain.   Eyes: Negative for visual disturbance.  Respiratory: Negative for apnea, cough and shortness of breath.   Cardiovascular: Negative for chest pain, palpitations and leg swelling.  Gastrointestinal: Negative for abdominal distention, diarrhea and nausea.  Genitourinary: Positive for difficulty urinating. Negative for dysuria.  Musculoskeletal: Positive for arthralgias.  Skin: Negative.   Neurological: Negative for weakness and numbness.  Psychiatric/Behavioral: Negative.      Objective:  BP 110/68   Pulse 61   Temp (!) 97.3 F (36.3 C)   Resp 16   Ht _0  (1.702 m)   Wt 202 lb (91.6 kg)   SpO2 96%   BMI 31.64 kg/m   BP/Weight 08/09/2020  99/10/7167 01/18/8937  Systolic BP 101 751 025  Diastolic BP 68 68 67  Wt. (Lbs) 202 199 196  BMI 31.64 31.17 30.7    Physical Exam Vitals reviewed.  Constitutional:      Appearance: Normal appearance.  HENT:     Right Ear: Tympanic membrane, ear canal and external ear normal.     Left Ear: Tympanic membrane, ear canal and external ear normal.     Nose: Nose normal.     Mouth/Throat:     Mouth: Mucous membranes are moist.  Eyes:     Conjunctiva/sclera: Conjunctivae normal.     Pupils: Pupils are equal, round, and reactive to light.  Cardiovascular:     Rate and Rhythm: Normal rate and regular rhythm.     Pulses: Normal pulses.     Heart sounds: Normal heart sounds. No murmur  heard. No gallop.   Pulmonary:     Effort: Pulmonary effort is normal.     Breath sounds: Normal breath sounds.  Abdominal:     General: Abdomen is flat.  Musculoskeletal:        General: Tenderness (knees) present. Normal range of motion.     Cervical back: Normal range of motion and neck supple.     Left lower leg: Edema present.  Skin:    General: Skin is warm and dry.     Capillary Refill: Capillary refill takes less than 2 seconds.  Neurological:     General: No focal deficit present.     Mental Status: He is alert and oriented to [place], place, and time. Mental status is at baseline.     Diabetic Foot Exam - Simple   Simple Foot Form Diabetic Foot exam was performed with the following findings: Yes 08/09/2020  8:05 AM  Visual Inspection No deformities, no ulcerations, no other skin breakdown bilaterally: Yes Sensation Testing Intact to touch and monofilament testing bilaterally: Yes Pulse Check See comments: Yes Comments Trace pulses      Lab Results  Component Value Date   WBC 5.0 03/29/2020   HGB 14.8 03/29/2020   HCT 44.4 03/29/2020   PLT 199 03/29/2020   GLUCOSE 109 (H) 04/05/2020   CHOL 143 03/29/2020   TRIG 89 03/29/2020   HDL 46 03/29/2020   LDLCALC 80 03/29/2020   ALT  19 04/05/2020   AST 19 04/05/2020   NA 141 04/05/2020   K 4.3 04/05/2020   CL 104 04/05/2020   CREATININE 1.06 04/05/2020   BUN 15 04/05/2020   CO2 25 04/05/2020   HGBA1C 6.4 (H) 03/29/2020   MICROALBUR 10 03/29/2020      Assessment & Plan:   1. Essential hypertension - CBC with Differential/Platelet - Comprehensive metabolic panel An individual hypertension care plan was established and reinforced today.  The patient's status was assessed using clinical findings on exam and labs or diagnostic tests. The patient's success at meeting treatment goals on disease specific evidence-based guidelines and found to be well controlled. SELF MANAGEMENT: The patient and I together assessed ways to personally work towards obtaining the recommended goals. RECOMMENDATIONS: avoid decongestants found in common cold remedies, decrease consumption of alcohol, perform routine monitoring of BP with home BP cuff, exercise, reduction of dietary salt, take medicines as prescribed, try not to miss doses and quit smoking.  Regular exercise and maintaining a healthy weight is needed.  Stress reduction may help. A CLINICAL SUMMARY including written plan identify barriers to care unique to individual due to social or financial issues.  We attempt to mutually creat solutions for individual and family understanding.  2. Coronary artery disease of native artery of native heart with stable angina pectoris ([place]) Patient's CAD was assessed using history and physical along with other information to maximize treatment.  Evidence based criteria was use in deciding proper management for this disease process.  Patient's CAD is under good control.therapy continue present mnagement.  3. Chronic diastolic heart failure ([place]) An individualized care plan was established and reinforced.  The patient's disease status was assessed using clinical finding son exam today, labs, and/or other diagnostic testing such as x-rays, to determine  the patient's success in meeting treatmentgoalsbased on disease-based guidelines and found to beimproving. But not at goal yet. Medications prescriptions no change Laboratory tests ordered to be performed today include routine. RECOMMENDATIONS: given include see cardiology.  Call physician is patient gains 3  lbs in one day or 5 lbs for one week.  Call for progressive PND, orthopnea or increased pedal edema.  4. Diabetic glomerulopathy ([facility]) - Hemoglobin A1c An individual care plan for diabetes was established and reinforced today.  The patient's status was assessed using clinical findings on exam, labs and diagnostic testing. Patient success at meeting goals based on disease specific evidence-based guidelines and found to be good controlled. Medications were assessed and patient's understanding of the medical issues , including barriers were assessed. Recommend adherence to a diabetic diet, a graduated exercise program, HgbA1c level is checked quarterly, and urine microalbumin performed yearly .  Annual mono-filament sensation testing performed. Lower blood pressure and control hyperlipidemia is important. Get annual eye exams and annual flu shots and smoking cessation discussed.  Self management goals were discussed.  5. BMI 31.0-31.9,adult An individualize plan was formulated for obesity using patient history and physical exam to encourage weight loss.  An evidence based program was formulated.  Patient is to cut portion size with meals and to plan physical exercise 3 days a week at least 20 minutes.  Weight watchers and other programs are helpful.  Planned amount of weight loss 10 lbs.  6. Mixed hyperlipidemia - Lipid panel AN INDIVIDUAL CARE PLAN for hyperlipidemia/ cholesterol was established and reinforced today.  The patient's status was assessed using clinical findings on exam, lab and other diagnostic tests. The patient's disease status was assessed based on evidence-based guidelines and  found to be well controlled. MEDICATIONS were reviewed. SELF MANAGEMENT GOALS have been discussed and patient's success at attaining the goal of low cholesterol was assessed. RECOMMENDATION given include regular exercise 3 days a week and low cholesterol/low fat diet. CLINICAL SUMMARY including written plan to identify barriers unique to the patient due to social or economic  reasons was discussed.      Orders Placed This Encounter  Procedures  . CBC with Differential/Platelet  . Comprehensive metabolic panel  . Hemoglobin A1c  . Lipid panel      I spent 20 minutes dedicated to the care of this patient on the date of this encounter to include face-to-face time with the patient, as well as: We discussed the need for follow-up with cardiology since he has not been there in quite some time even though he is stable.  We again discussed of weight loss and how it will help his diabetes and his hypertension.  I reviewed his immunizations.  He No saddle  Follow-up: Return in about 4 months (around 12/08/2020) for fasting.  An After Visit Summary was printed and given to the patient.  Reinaldo Meeker, MD [facility] [phone]

## 2020-08-10 LAB — COMPREHENSIVE METABOLIC PANEL
ALT: 14 IU/L (ref 0–44)
AST: 18 IU/L (ref 0–40)
Albumin/Globulin Ratio: 1.5 (ref 1.2–2.2)
Albumin: 4.1 g/dL (ref 3.7–4.7)
Alkaline Phosphatase: 61 IU/L (ref 44–121)
BUN/Creatinine Ratio: 17 (ref 10–24)
BUN: 20 mg/dL (ref 8–27)
Bilirubin Total: 0.3 mg/dL (ref 0.0–1.2)
CO2: 26 mmol/L (ref 20–29)
Calcium: 9.8 mg/dL (ref 8.6–10.2)
Chloride: 106 mmol/L (ref 96–106)
Creatinine, Ser: 1.21 mg/dL (ref 0.76–1.27)
GFR calc Af Amer: 68 mL/min/{1.73_m2} (ref >59)
GFR calc non Af Amer: 59 mL/min/{1.73_m2} — ABNORMAL LOW (ref >59)
Globulin, Total: 2.7 g/dL (ref 1.5–4.5)
Glucose: 127 mg/dL — ABNORMAL HIGH (ref 65–99)
Potassium: 4.5 mmol/L (ref 3.5–5.2)
Sodium: 145 mmol/L — ABNORMAL HIGH (ref 134–144)
Total Protein: 6.8 g/dL (ref 6.0–8.5)

## 2020-08-10 LAB — CBC WITH DIFFERENTIAL/PLATELET
Basophils Absolute: 0 10*3/uL (ref 0.0–0.2)
Basos: 0 %
EOS (ABSOLUTE): 0.1 10*3/uL (ref 0.0–0.4)
Eos: 2 %
Hematocrit: 40.1 % (ref 37.5–51.0)
Hemoglobin: 13.3 g/dL (ref 13.0–17.7)
Immature Grans (Abs): 0 10*3/uL (ref 0.0–0.1)
Immature Granulocytes: 0 %
Lymphocytes Absolute: 2.4 10*3/uL (ref 0.7–3.1)
Lymphs: 51 %
MCH: 31.4 pg (ref 26.6–33.0)
MCHC: 33.2 g/dL (ref 31.5–35.7)
MCV: 95 fL (ref 79–97)
Monocytes Absolute: 0.6 10*3/uL (ref 0.1–0.9)
Monocytes: 12 %
Neutrophils Absolute: 1.6 10*3/uL (ref 1.4–7.0)
Neutrophils: 35 %
Platelets: 203 10*3/uL (ref 150–450)
RBC: 4.23 x10E6/uL (ref 4.14–5.80)
RDW: 13.7 % (ref 11.6–15.4)
WBC: 4.7 10*3/uL (ref 3.4–10.8)

## 2020-08-10 LAB — LIPID PANEL
Chol/HDL Ratio: 3.5 ratio (ref 0.0–5.0)
Cholesterol, Total: 140 mg/dL (ref 100–199)
HDL: 40 mg/dL (ref >39)
LDL Chol Calc (NIH): 78 mg/dL (ref 0–99)
Triglycerides: 122 mg/dL (ref 0–149)
VLDL Cholesterol Cal: 22 mg/dL (ref 5–40)

## 2020-08-10 LAB — CARDIOVASCULAR RISK ASSESSMENT

## 2020-08-10 LAB — HEMOGLOBIN A1C
Est. average glucose Bld gHb Est-mCnc: 128 mg/dL
Hgb A1c MFr Bld: 6.1 % — ABNORMAL HIGH (ref 4.8–5.6)

## 2020-08-10 NOTE — Progress Notes (Signed)
CBC all normal, Glucose 127, kidney tests stage 3, liver tests normal, A1c 6.1 good, Cholesterol normal,  lp

## 2020-08-25 ENCOUNTER — Other Ambulatory Visit: Payer: Self-pay

## 2020-08-25 NOTE — Patient Outreach (Signed)
[facility]) Care Management  08/25/2020  Mark [name] [birth year] [phone]   Telephone Assessment   Unsuccessful quarterly outreach attempt to patient.    Plan: RN CM will send unsuccessful outreach letter to patient. RN CM will make quarterly outreach attempt to patient within the month of April if no return call from patient.  Antionette Fairy, RN,BSN,CCM [facility] Care Management Telephonic Care Management Coordinator Direct Phone: [phone] Toll Free: [phone] Fax: [phone]

## 2020-08-26 ENCOUNTER — Ambulatory Visit: Payer: Self-pay

## 2020-09-23 NOTE — Telephone Encounter (Signed)
This encounter was created in error - please disregard.

## 2020-11-03 ENCOUNTER — Other Ambulatory Visit: Payer: Self-pay | Admitting: Legal Medicine

## 2020-11-23 ENCOUNTER — Other Ambulatory Visit: Payer: Self-pay

## 2020-11-23 NOTE — Patient Outreach (Signed)
[facility]) Care Management  11/23/2020  Mark [name] 12-31-1945 [id number]   Telephone Assessment Quarterly Call    Unsuccessful outreach to patient.     Plan: RN CM will make quarterly outreach attempt to patient within the month of July.   Antionette Fairy, RN,BSN,CCM [facility] Care Management Telephonic Care Management Coordinator Direct Phone: [phone] Toll Free: [phone] Fax: [phone]

## 2020-12-08 ENCOUNTER — Other Ambulatory Visit: Payer: Self-pay

## 2020-12-08 ENCOUNTER — Encounter: Payer: Self-pay | Admitting: Legal Medicine

## 2020-12-08 ENCOUNTER — Ambulatory Visit (INDEPENDENT_AMBULATORY_CARE_PROVIDER_SITE_OTHER): Payer: Medicare HMO | Admitting: Legal Medicine

## 2020-12-08 VITALS — BP 90/60 | HR 62 | Temp 97.0°F | Resp 16 | Ht 67.0 in | Wt 207.0 lb

## 2020-12-08 DIAGNOSIS — I25118 Atherosclerotic heart disease of native coronary artery with other forms of angina pectoris: Secondary | ICD-10-CM | POA: Diagnosis not present

## 2020-12-08 DIAGNOSIS — N1831 Chronic kidney disease, stage 3a: Secondary | ICD-10-CM

## 2020-12-08 DIAGNOSIS — N179 Acute kidney failure, unspecified: Secondary | ICD-10-CM

## 2020-12-08 DIAGNOSIS — N178 Other acute kidney failure: Secondary | ICD-10-CM

## 2020-12-08 DIAGNOSIS — E1159 Type 2 diabetes mellitus with other circulatory complications: Secondary | ICD-10-CM | POA: Diagnosis not present

## 2020-12-08 DIAGNOSIS — I5032 Chronic diastolic (congestive) heart failure: Secondary | ICD-10-CM | POA: Diagnosis not present

## 2020-12-08 DIAGNOSIS — E782 Mixed hyperlipidemia: Secondary | ICD-10-CM | POA: Diagnosis not present

## 2020-12-08 DIAGNOSIS — J209 Acute bronchitis, unspecified: Secondary | ICD-10-CM

## 2020-12-08 DIAGNOSIS — I10 Essential (primary) hypertension: Secondary | ICD-10-CM | POA: Diagnosis not present

## 2020-12-08 DIAGNOSIS — J440 Chronic obstructive pulmonary disease with acute lower respiratory infection: Secondary | ICD-10-CM

## 2020-12-08 DIAGNOSIS — M1711 Unilateral primary osteoarthritis, right knee: Secondary | ICD-10-CM

## 2020-12-08 DIAGNOSIS — E1121 Type 2 diabetes mellitus with diabetic nephropathy: Secondary | ICD-10-CM

## 2020-12-08 DIAGNOSIS — Z6831 Body mass index (BMI) 31.0-31.9, adult: Secondary | ICD-10-CM

## 2020-12-08 DIAGNOSIS — I739 Peripheral vascular disease, unspecified: Secondary | ICD-10-CM | POA: Diagnosis not present

## 2020-12-08 HISTORY — DX: Chronic kidney disease, stage 3a: N18.31

## 2020-12-08 HISTORY — DX: Acute kidney failure, unspecified: N17.9

## 2020-12-08 HISTORY — DX: Unilateral primary osteoarthritis, right knee: M17.11

## 2020-12-08 NOTE — Progress Notes (Signed)
Subjective:  Patient ID: Mark [name], male    DOB: [birth year]  Age: 75 y.o. MRN: [id number]  Chief Complaint  Patient presents with  . Diabetes  . Hypertension  . Hyperlipidemia    HPI: chronic visit  Patient present with type 2 diabetes.  Specifically, this is type 2, noninsulin requiring diabetes, complicated by nephropathy.  Compliance with treatment has been good; patient take medicines as directed, maintains diet and exercise regimen, follows up as directed, and is keeping glucose diary.  Date of  diagnosis 2010.  Depression screen has been performed.Tobacco screen nonsmoker. Current medicines for diabetes metformin.  Patient is on none for renal protection and arorvastatin for cholesterol control.  Patient performs foot exams daily and last ophthalmologic exam was yes.  Patient presents for follow up of hypertension.  Patient tolerating coreg,  well with side effects.  Patient was diagnosed with hypertension 2010 so has been treated for hypertension for 10 years.Patient is working on maintaining diet and exercise regimen and follows up as directed. Complication include none.  Patient presents with hyperlipidemia.  Compliance with treatment has been good; patient takes medicines as directed, maintains low cholesterol diet, follows up as directed, and maintains exercise regimen.  Patient is using atorvastatin without problems.   Current Outpatient Medications on File Prior to Visit  Medication Sig Dispense Refill  . ACCU-CHEK AVIVA PLUS test strip TEST BLOOD SUGAR QD (Patient taking differently: 1 each daily before breakfast. TEST BLOOD SUGAR QD) 100 each 3  . Accu-Chek Softclix Lancets lancets USE TO CHECK BLOOD SUGAR ONCE A DAY 100 each 3  . albuterol (VENTOLIN HFA) 108 (90 Base) MCG/ACT inhaler     . aspirin 81 MG chewable tablet Chew by mouth daily.    Marland Kitchen atorvastatin (LIPITOR) 20 MG tablet TAKE 1 TABLET EVERY DAY 90 tablet 2  . Blood Glucose Monitoring Suppl (ACCU-CHEK AVIVA  PLUS) w/Device KIT 1 each by Does not apply route every morning. 1 kit 0  . carvedilol (COREG) 6.25 MG tablet TAKE 1 TABLET EVERY DAY 90 tablet 2  . diclofenac Sodium (VOLTAREN) 1 % GEL APPLY 2 GRAMS EXTERNALLY TO THE AFFECTED AREA FOUR TIMES DAILY 200 g 6  . furosemide (LASIX) 20 MG tablet TAKE 1/2 TABLET EVERY DAY 45 tablet 2  . Magnesium 400 MG TABS Take 1 tablet by mouth daily.    . metFORMIN (GLUCOPHAGE) 500 MG tablet TAKE 1/2 TABLET TWICE DAILY 90 tablet 2  . nitroGLYCERIN (NITROSTAT) 0.4 MG SL tablet DISSOLVE ONE TABLET UNDER TONGUE AS NEEDED FOR CHEST PAIN UP TO 3 TABLETS 100 tablet 5  . spironolactone (ALDACTONE) 25 MG tablet TAKE 1/2 TABLET EVERY DAY 45 tablet 2   No current facility-administered medications on file prior to visit.   Past Medical History:  Diagnosis Date  . Arthritis   . Chronic diastolic heart failure (HCC)   . DVT (deep venous thrombosis) (HCC)   . Essential hypertension   . History of BPH   . Peripheral vascular disease [facility])    Past Surgical History:  Procedure Laterality Date  . HERNIA REPAIR    . INGUINAL HERNIA REPAIR Bilateral     History reviewed. No pertinent family history. Social History   Socioeconomic History  . Marital status: Single    Spouse name: Not on file  . Number of children: 4  . Years of education: 63  . Highest education level: 12th grade  Occupational History  . Occupation: retired   Tobacco Use  . Smoking  status: Never Smoker  . Smokeless tobacco: Never Used  Vaping Use  . Vaping Use: Never used  Substance and Sexual Activity  . Alcohol use: No  . Drug use: No  . Sexual activity: Not Currently  Other Topics Concern  . Not on file  Social History Narrative  . Not on file   Social Determinants of Health   Financial Resource Strain: Low Risk   . Difficulty of Paying Living Expenses: Not hard at all  Food Insecurity: No Food Insecurity  . Worried About Programme researcher, broadcasting/film/video in the Last Year: Never true  . Ran Out  of Food in the Last Year: Never true  Transportation Needs: No Transportation Needs  . Lack of Transportation (Medical): No  . Lack of Transportation (Non-Medical): No  Physical Activity: Sufficiently Active  . Days of Exercise per Week: 5 days  . Minutes of Exercise per Session: 150+ min  Stress: No Stress Concern Present  . Feeling of Stress : Not at all  Social Connections: Moderately Integrated  . Frequency of Communication with Friends and Family: Once a week  . Frequency of Social Gatherings with Friends and Family: More than three times a week  . Attends Religious Services: 1 to 4 times per year  . Active Member of Clubs or Organizations: No  . Attends Banker Meetings: Never  . Marital Status: Married    Review of Systems  Constitutional: Negative for activity change and appetite change.  HENT: Negative for congestion.   Eyes: Negative for visual disturbance.  Respiratory: Negative for chest tightness and shortness of breath.   Cardiovascular: Negative for chest pain, palpitations and leg swelling.  Gastrointestinal: Negative for abdominal distention and abdominal pain.  Endocrine: Negative for polyuria.  Genitourinary: Negative for difficulty urinating, dysuria and urgency.  Musculoskeletal: Positive for arthralgias.  Skin: Negative.   Neurological: Positive for numbness.  Psychiatric/Behavioral: Negative.      Objective:  BP 90/60   Pulse 62   Temp (!) 97 F (36.1 C)   Resp 16   Ht 5\' 7"  (1.702 m)   Wt 207 lb (93.9 kg)   SpO2 97%   BMI 32.42 kg/m   BP/Weight 12/08/2020 08/09/2020 07/20/2020  Systolic BP 90 110 128  Diastolic BP 60 68 68  Wt. (Lbs) 207 202 199  BMI 32.42 31.64 31.17    Physical Exam Vitals reviewed.  Constitutional:      Appearance: Normal appearance. He is obese.  HENT:     Head: Normocephalic.     Right Ear: Tympanic membrane, ear canal and external ear normal.     Left Ear: Tympanic membrane, ear canal and external  ear normal.     Mouth/Throat:     Mouth: Mucous membranes are moist.     Pharynx: Oropharynx is clear.  Eyes:     Extraocular Movements: Extraocular movements intact.     Conjunctiva/sclera: Conjunctivae normal.     Pupils: Pupils are equal, round, and reactive to light.  Cardiovascular:     Rate and Rhythm: Normal rate and regular rhythm.     Pulses: Normal pulses.     Heart sounds: No murmur heard. No gallop.   Pulmonary:     Effort: Pulmonary effort is normal. No respiratory distress.     Breath sounds: Normal breath sounds. No rales.  Abdominal:     General: Abdomen is flat. Bowel sounds are normal. There is no distension.     Palpations: Abdomen is soft.  Tenderness: There is no abdominal tenderness.  Musculoskeletal:        General: Normal range of motion.     Cervical back: Normal range of motion and neck supple.  Skin:    General: Skin is warm.     Capillary Refill: Capillary refill takes less than 2 seconds.  Neurological:     General: No focal deficit present.     Mental Status: He is alert and oriented to [place], place, and time. Mental status is at baseline.     Sensory: Sensory deficit present.  Psychiatric:        Mood and Affect: Mood normal.        Thought Content: Thought content normal.        Judgment: Judgment normal.     Diabetic Foot Exam - Simple   Simple Foot Form Diabetic Foot exam was performed with the following findings: Yes 12/08/2020  8:41 AM  Visual Inspection No deformities, no ulcerations, no other skin breakdown bilaterally: Yes Sensation Testing See comments: Yes Pulse Check Posterior Tibialis and Dorsalis pulse intact bilaterally: Yes Comments No sensation feet      Lab Results  Component Value Date   WBC 4.7 08/09/2020   HGB 13.3 08/09/2020   HCT 40.1 08/09/2020   PLT 203 08/09/2020   GLUCOSE 127 (H) 08/09/2020   CHOL 140 08/09/2020   TRIG 122 08/09/2020   HDL 40 08/09/2020   LDLCALC 78 08/09/2020   ALT 14 08/09/2020    AST 18 08/09/2020   NA 145 (H) 08/09/2020   K 4.5 08/09/2020   CL 106 08/09/2020   CREATININE 1.21 08/09/2020   BUN 20 08/09/2020   CO2 26 08/09/2020   HGBA1C 6.1 (H) 08/09/2020   MICROALBUR 10 03/29/2020      Assessment & Plan:   1. Essential hypertension - Comprehensive metabolic panel - CBC with Differential/Platelet An individual hypertension care plan was established and reinforced today.  The patient's status was assessed using clinical findings on exam and labs or diagnostic tests. The patient's success at meeting treatment goals on disease specific evidence-based guidelines and found to be well controlled. SELF MANAGEMENT: The patient and I together assessed ways to personally work towards obtaining the recommended goals. RECOMMENDATIONS: avoid decongestants found in common cold remedies, decrease consumption of alcohol, perform routine monitoring of BP with home BP cuff, exercise, reduction of dietary salt, take medicines as prescribed, try not to miss doses and quit smoking.  Regular exercise and maintaining a healthy weight is needed.  Stress reduction may help. A CLINICAL SUMMARY including written plan identify barriers to care unique to individual due to social or financial issues.  We attempt to mutually creat solutions for individual and family understanding.  2. Coronary artery disease of native artery of native heart with stable angina pectoris ([place]) Patient's CAD was assessed using history and physical along with other information to maximize treatment.  Evidence based criteria was use in deciding proper management for this disease process.  Patient's CAD is under good control.therapy continue treatment.  3. Diabetic glomerulopathy (HCC) - Hemoglobin A1c An individual care plan for diabetes was established and reinforced today.  The patient's status was assessed using clinical findings on exam, labs and diagnostic testing. Patient success at meeting goals based on  disease specific evidence-based guidelines and found to be good controlled. Medications were assessed and patient's understanding of the medical issues , including barriers were assessed. Recommend adherence to a diabetic diet, a graduated exercise program, HgbA1c level  is checked quarterly, and urine microalbumin performed yearly .  Annual mono-filament sensation testing performed. Lower blood pressure and control hyperlipidemia is important. Get annual eye exams and annual flu shots and smoking cessation discussed.  Self management goals were discussed.  4. Mixed hyperlipidemia - Lipid panel AN INDIVIDUAL CARE PLAN for hyperlipidemia/ cholesterol was established and reinforced today.  The patient's status was assessed using clinical findings on exam, lab and other diagnostic tests. The patient's disease status was assessed based on evidence-based guidelines and found to be fair controlled. MEDICATIONS were reviewed. SELF MANAGEMENT GOALS have been discussed and patient's success at attaining the goal of low cholesterol was assessed. RECOMMENDATION given include regular exercise 3 days a week and low cholesterol/low fat diet. CLINICAL SUMMARY including written plan to identify barriers unique to the patient due to social or economic  reasons was discussed.  5. Peripheral vascular disease ([place]) Patient has PVD with minimal claudication  6. Chronic diastolic heart failure (HCC) An individualized care plan was established and reinforced.  The patient's disease status was assessed using clinical finding son exam today, labs, and/or other diagnostic testing such as x-rays, to determine the patient's success in meeting treatmentgoalsbased on disease-based guidelines and found to beimproving. But not at goal yet. Medications prescriptions no changes Laboratory tests ordered to be performed today include routine. RECOMMENDATIONS: given include see cardiology.  Call physician is patient gains 3 lbs in one  day or 5 lbs for one week.  Call for progressive PND, orthopnea or increased pedal edema.  7. Acute renal failure with other specified pathological kidney lesion superimposed on stage 3a chronic kidney disease ([place]) AN INDIVIDUAL CARE PLAN for renal failure was established and reinforced today.  The patient's status was assessed using clinical findings on exam, labs, and other diagnostic testing. Patient's success at meeting treatment goals based on disease specific evidence-bassed guidelines and found to be in fair control. RECOMMENDATIONS include maintaining present medicines and treatment.  8. Acute bronchitis with COPD ([place]) An individualize plan was formulated for care of COPD.  Treatment is evidence based.  She will continue on inhalers, avoid smoking and smoke.  Regular exercise with help with dyspnea. Routine follow ups and medication compliance is needed.  9. Type 2 diabetes mellitus with vascular disease ([place]) - Hemoglobin A1c An individual care plan for diabetes was established and reinforced today.  The patient's status was assessed using clinical findings on exam, labs and diagnostic testing. Patient success at meeting goals based on disease specific evidence-based guidelines and found to be fair controlled. Medications were assessed and patient's understanding of the medical issues , including barriers were assessed. Recommend adherence to a diabetic diet, a graduated exercise program, HgbA1c level is checked quarterly, and urine microalbumin performed yearly .  Annual mono-filament sensation testing performed. Lower blood pressure and control hyperlipidemia is important. Get annual eye exams and annual flu shots and smoking cessation discussed.  Self management goals were discussed.  10. BMI 31.0-31.9,adult An individualize plan was formulated for obesity using patient history and physical exam to encourage weight loss.  An evidence based program was formulated.  Patient is to cut  portion size with meals and to plan physical exercise 3 days a week at least 20 minutes.  Weight watchers and other programs are helpful.  Planned amount of weight loss 10 lbs.  11. Primary osteoarthritis of right knee - Ambulatory referral to Orthopedic Surgery Patient is having progressive OA right knee, has trouble walking  Orders Placed This Encounter  Procedures  . Comprehensive metabolic panel  . Hemoglobin A1c  . Lipid panel  . CBC with Differential/Platelet  . Ambulatory referral to Orthopedic Surgery   30+ minute visit with review of records  Follow-up: Return in about 4 months (around 04/09/2021) for fasting.  An After Visit Summary was printed and given to the patient.  Reinaldo Meeker, MD [facility] [phone]

## 2020-12-09 LAB — COMPREHENSIVE METABOLIC PANEL
ALT: 17 IU/L (ref 0–44)
AST: 18 IU/L (ref 0–40)
Albumin/Globulin Ratio: 1.4 (ref 1.2–2.2)
Albumin: 4.2 g/dL (ref 3.7–4.7)
Alkaline Phosphatase: 63 IU/L (ref 44–121)
BUN/Creatinine Ratio: 10 (ref 10–24)
BUN: 13 mg/dL (ref 8–27)
Bilirubin Total: 0.5 mg/dL (ref 0.0–1.2)
CO2: 22 mmol/L (ref 20–29)
Calcium: 9.7 mg/dL (ref 8.6–10.2)
Chloride: 108 mmol/L — ABNORMAL HIGH (ref 96–106)
Creatinine, Ser: 1.25 mg/dL (ref 0.76–1.27)
Globulin, Total: 2.9 g/dL (ref 1.5–4.5)
Glucose: 115 mg/dL — ABNORMAL HIGH (ref 65–99)
Potassium: 4.7 mmol/L (ref 3.5–5.2)
Sodium: 147 mmol/L — ABNORMAL HIGH (ref 134–144)
Total Protein: 7.1 g/dL (ref 6.0–8.5)
eGFR: 60 mL/min/{1.73_m2} (ref >59)

## 2020-12-09 LAB — LIPID PANEL
Chol/HDL Ratio: 3.1 ratio (ref 0.0–5.0)
Cholesterol, Total: 145 mg/dL (ref 100–199)
HDL: 47 mg/dL (ref >39)
LDL Chol Calc (NIH): 85 mg/dL (ref 0–99)
Triglycerides: 66 mg/dL (ref 0–149)
VLDL Cholesterol Cal: 13 mg/dL (ref 5–40)

## 2020-12-09 LAB — CBC WITH DIFFERENTIAL/PLATELET
Basophils Absolute: 0 10*3/uL (ref 0.0–0.2)
Basos: 0 %
EOS (ABSOLUTE): 0.1 10*3/uL (ref 0.0–0.4)
Eos: 3 %
Hematocrit: 43.7 % (ref 37.5–51.0)
Hemoglobin: 13.9 g/dL (ref 13.0–17.7)
Immature Grans (Abs): 0 10*3/uL (ref 0.0–0.1)
Immature Granulocytes: 0 %
Lymphocytes Absolute: 2.2 10*3/uL (ref 0.7–3.1)
Lymphs: 44 %
MCH: 30.8 pg (ref 26.6–33.0)
MCHC: 31.8 g/dL (ref 31.5–35.7)
MCV: 97 fL (ref 79–97)
Monocytes Absolute: 0.6 10*3/uL (ref 0.1–0.9)
Monocytes: 13 %
Neutrophils Absolute: 2 10*3/uL (ref 1.4–7.0)
Neutrophils: 40 %
Platelets: 208 10*3/uL (ref 150–450)
RBC: 4.51 x10E6/uL (ref 4.14–5.80)
RDW: 14.4 % (ref 11.6–15.4)
WBC: 5 10*3/uL (ref 3.4–10.8)

## 2020-12-09 LAB — CARDIOVASCULAR RISK ASSESSMENT

## 2020-12-09 LAB — HEMOGLOBIN A1C
Est. average glucose Bld gHb Est-mCnc: 128 mg/dL
Hgb A1c MFr Bld: 6.1 % — ABNORMAL HIGH (ref 4.8–5.6)

## 2020-12-09 NOTE — Progress Notes (Signed)
Glucose 115, kidney tests normal, liver tests normal, A1c 6.1, Cholesterol normal, CBC normal lp

## 2020-12-14 DIAGNOSIS — M1711 Unilateral primary osteoarthritis, right knee: Secondary | ICD-10-CM | POA: Diagnosis not present

## 2021-02-02 ENCOUNTER — Telehealth: Payer: Self-pay | Admitting: *Deleted

## 2021-02-02 NOTE — Chronic Care Management (AMB) (Signed)
  Chronic Care Management   Note  02/02/2021 Name: Mark [name] MRN: [id number] DOB: 1946/04/19  Mark [name] is a 75 y.o. year old male who is a primary care patient of Lillard Anes, MD. I reached out to [place] by phone today in response to a referral sent by Mark [name]'s PCP, Dr. Henrene Pastor .      Mark [name] was given information about Chronic Care Management services today including:  CCM service includes personalized support from designated clinical staff supervised by his physician, including individualized plan of care and coordination with other care providers 24/7 contact phone numbers for assistance for urgent and routine care needs. Service will only be billed when office clinical staff spend 20 minutes or more in a month to coordinate care. Only one practitioner may furnish and bill the service in a calendar month. The patient may stop CCM services at any time (effective at the end of the month) by phone call to the office staff. The patient will be responsible for cost sharing (co-pay) of up to 20% of the service fee (after annual deductible is met).  Patient agreed to services and verbal consent obtained.   Follow up plan: Telephone appointment with care management team member scheduled for:02/22/2021  [place] Management

## 2021-02-07 ENCOUNTER — Other Ambulatory Visit: Payer: Self-pay

## 2021-02-07 NOTE — Patient Outreach (Signed)
[facility]) Care Management  02/07/2021  Terrell Ostrand Tristan 25-Feb-1946 [id number]   Telephone Assessment   Unsuccessful quarterly outreach attempt to patient.   Plan: RN CM will make quarterly outreach attempt to patient within the month of Sept.  Lindel Marcell [name] [facility] Care Management Telephonic Care Management Coordinator Direct Phone: [phone] Toll Free: [phone] Fax: [phone]

## 2021-02-22 ENCOUNTER — Ambulatory Visit (INDEPENDENT_AMBULATORY_CARE_PROVIDER_SITE_OTHER): Payer: Medicare HMO

## 2021-02-22 DIAGNOSIS — I10 Essential (primary) hypertension: Secondary | ICD-10-CM

## 2021-02-22 DIAGNOSIS — M1711 Unilateral primary osteoarthritis, right knee: Secondary | ICD-10-CM | POA: Diagnosis not present

## 2021-02-22 NOTE — Patient Instructions (Signed)
Visit Information   PATIENT GOALS:   Goals Addressed             This Visit's Progress    Manage Chronic Pain   On track    Timeframe:  Long-Range Goal Priority:  Medium Start Date:       02/22/2021                      Expected End Date:    08/04/2021             Follow Up Date 05/03/2021    - develop a personal pain management plan - plan exercise or activity when pain is best controlled - track what makes the pain worse and what makes it better - use ice or heat for pain relief - work slower and less intense when having pain    Why is this important?   Day-to-day life can be hard when you have chronic pain.  Pain medicine is just one piece of the treatment puzzle.  You can try these action steps to help you manage your pain.         Track and Manage My Blood Pressure-Hypertension       Timeframe:  Long-Range Goal Priority:  High Start Date:      02/22/2021                       Expected End Date:      08/04/2021                 Follow Up Date 05/03/2021    - check blood pressure daily - choose a place to take my blood pressure (home, clinic or office, retail store)    Why is this important?   You won't feel high blood pressure, but it can still hurt your blood vessels.  High blood pressure can cause heart or kidney problems. It can also cause a stroke.  Making lifestyle changes like losing a little weight or eating less salt will help.  Checking your blood pressure at home and at different times of the day can help to control blood pressure.  If the doctor prescribes medicine remember to take it the way the doctor ordered.  Call the office if you cannot afford the medicine or if there are questions about it.              Consent to CCM Services: Mark [name] was given information about Chronic Care Management services today including:  CCM service includes personalized support from designated clinical staff supervised by his physician, including  individualized plan of care and coordination with other care providers 24/7 contact phone numbers for assistance for urgent and routine care needs. Service will only be billed when office clinical staff spend 20 minutes or more in a month to coordinate care. Only one practitioner may furnish and bill the service in a calendar month. The patient may stop CCM services at any time (effective at the end of the month) by phone call to the office staff. The patient will be responsible for cost sharing (co-pay) of up to 20% of the service fee (after annual deductible is met).  Patient agreed to services and verbal consent obtained.   The patient verbalized understanding of instructions, educational materials, and care plan provided today and agreed to receive a mailed copy of patient instructions, educational materials, and care plan.   Telephone follow up appointment with care management team member  scheduled for:  05/03/2021  Tomasa Rand, RN, BSN, CEN RN Case Manager [facility] [phone]    CLINICAL CARE PLAN: Patient Care Plan: Hypertension (Adult)     Problem Identified: Hypertension (Hypertension)      Long-Range Goal: Hypertension Monitored   Start Date: 02/22/2021  Expected End Date: 08/04/2021  This Visit's Progress: On track  Priority: High  Note:   Objective:  Last practice recorded BP readings:  BP Readings from Last 3 Encounters:  12/08/20 90/60  08/09/20 110/68  07/20/20 128/68   Most recent eGFR/CrCl:  Lab Results  Component Value Date   EGFR 60 12/08/2020    No components found for: CRCL Current Barriers:  Not currently recording BP readings.  Case Manager Clinical Goal(s):  patient will verbalize understanding of plan for hypertension management patient will attend all scheduled medical appointments: Reviewed pending MD appointments patient will demonstrate improved adherence to prescribed treatment plan for hypertension as evidenced by taking all  medications as prescribed, monitoring and recording blood pressure as directed, adhering to low sodium/DASH diet patient will demonstrate improved health management independence as evidenced by checking blood pressure as directed and notifying PCP if SBP>140 or DBP > 90, taking all medications as prescribe, and adhering to a low sodium diet as discussed. Reviewed importance of staying hydrated. Encouraged patient to call MD for low less than 100 or high BP of greater than 140/90 Interventions Collaboration with Lillard Anes, MD regarding development and update of comprehensive plan of care as evidenced by provider attestation and co-signature Inter-disciplinary care team collaboration (see longitudinal plan of care) Evaluation of current treatment plan related to hypertension self management and patient's adherence to plan as established by provider. Provided education to patient re: stroke prevention, s/s of heart attack and stroke, DASH diet, complications of uncontrolled blood pressure Reviewed medications with patient and discussed importance of compliance Discussed plans with patient for ongoing care management follow up and provided patient with direct contact information for care management team Advised patient, providing education and rationale, to monitor blood pressure daily and record, calling PCP for findings outside established parameters.  Reviewed scheduled/upcoming provider appointments including:  Self-Care Activities: - Self administers medications as prescribed Attends all scheduled provider appointments Calls provider office for new concerns, questions, or BP outside discussed parameters Checks BP and records as discussed Follows a low sodium diet/DASH diet Patient Goals: - check blood pressure daily - write blood pressure results in a log or diary Follow Up Plan: Telephone follow up appointment with care management team member scheduled for:  05/03/2021     Patient Care Plan: Chronic Pain (Adult)     Problem Identified: Chronic Pain Management (Chronic Pain)   Priority: Medium  Onset Date: 02/22/2021  Note:   Current Barriers:  Ineffective Self Health Maintenance in a patient with  Chronic pain as evidence by continued chronic pain of the knees  Clinical Goal(s):  Collaboration with Lillard Anes, MD regarding development and update of comprehensive plan of care as evidenced by provider attestation and co-signature Inter-disciplinary care team collaboration (see longitudinal plan of care) patient will work with care management team to address care coordination and chronic disease management needs related to Disease Management   Interventions:  Evaluation of current treatment plan related to  chronic pain ,  self-management and patient's adherence to plan as established by provider. Collaboration with Lillard Anes, MD regarding development and update of comprehensive plan of care as evidenced by provider attestation  and co-signature Inter-disciplinary care team collaboration (see longitudinal plan of care) Discussed plans with patient for ongoing care management follow up and provided patient with direct contact information for care management team Encouraged patient to stay active Reviewed OTC medications like tylenol for pain Encouraged patient to continue to use muscle/ joint rubs like bengay. Self Care Activities:  Attends all scheduled provider appointments Performs ADL's independently Performs IADL's independently Calls provider office for new concerns or questions Patient Goals: - develop a personal pain management plan - plan exercise or activity when pain is best controlled - track what makes the pain worse and what makes it better - use ice or heat for pain relief - work slower and less intense when having pain  Follow Up Plan: Telephone follow up appointment with care management team member  scheduled for: 05/03/2021

## 2021-02-22 NOTE — Chronic Care Management (AMB) (Signed)
Chronic Care Management   CCM RN Visit Note  02/22/2021 Name: Mark [name] MRN: [id number] DOB: 11/13/1945  Subjective: Mark [name] is a 75 y.o. year old male who is a primary care patient of Lillard Anes, MD. The care management team was consulted for assistance with disease management and care coordination needs.    Engaged with patient by telephone for initial visit in response to provider referral for case management and/or care coordination services.   Consent to Services:  The patient was given the following information about Chronic Care Management services today, agreed to services, and gave verbal consent: 1. CCM service includes personalized support from designated clinical staff supervised by the primary care provider, including individualized plan of care and coordination with other care providers 2. 24/7 contact phone numbers for assistance for urgent and routine care needs. 3. Service will only be billed when office clinical staff spend 20 minutes or more in a month to coordinate care. 4. Only one practitioner may furnish and bill the service in a calendar month. 5.The patient may stop CCM services at any time (effective at the end of the month) by phone call to the office staff. 6. The patient will be responsible for cost sharing (co-pay) of up to 20% of the service fee (after annual deductible is met). Patient agreed to services and consent obtained.  Patient agreed to services and verbal consent obtained.   Assessment: Review of patient past medical history, allergies, medications, health status, including review of consultants reports, laboratory and other test data, was performed as part of comprehensive evaluation and provision of chronic care management services.   SDOH (Social Determinants of Health) assessments and interventions performed:  SDOH Interventions    Flowsheet Row Most Recent Value  SDOH Interventions   Food Insecurity Interventions  Intervention Not Indicated  Financial Strain Interventions Patient Refused  [offfered social worker referral and patient refused.]  Housing Interventions Intervention Not Indicated  Intimate Partner Violence Interventions Intervention Not Indicated  Physical Activity Interventions Intervention Not Indicated  Transportation Interventions Intervention Not Indicated        CCM Care Plan  Allergies  Allergen Reactions   Penicillins Rash    Can take by mouth, can not take injections will break out in a rash    Outpatient Encounter Medications as of 02/22/2021  Medication Sig   ACCU-CHEK AVIVA PLUS test strip TEST BLOOD SUGAR QD (Patient taking differently: 1 each daily before breakfast. TEST BLOOD SUGAR QD)   Accu-Chek Softclix Lancets lancets USE TO CHECK BLOOD SUGAR ONCE A DAY   albuterol (VENTOLIN HFA) 108 (90 Base) MCG/ACT inhaler    aspirin 81 MG chewable tablet Chew by mouth daily.   atorvastatin (LIPITOR) 20 MG tablet TAKE 1 TABLET EVERY DAY   Blood Glucose Monitoring Suppl (ACCU-CHEK AVIVA PLUS) w/Device KIT 1 each by Does not apply route every morning.   carvedilol (COREG) 6.25 MG tablet TAKE 1 TABLET EVERY DAY   Magnesium 400 MG TABS Take 1 tablet by mouth daily.   diclofenac Sodium (VOLTAREN) 1 % GEL APPLY 2 GRAMS EXTERNALLY TO THE AFFECTED AREA FOUR TIMES DAILY (Patient not taking: Reported on 02/22/2021)   furosemide (LASIX) 20 MG tablet TAKE 1/2 TABLET EVERY DAY (Patient taking differently: 20 mg every other day.)   metFORMIN (GLUCOPHAGE) 500 MG tablet TAKE 1/2 TABLET TWICE DAILY (Patient taking differently: 500 mg daily.)   nitroGLYCERIN (NITROSTAT) 0.4 MG SL tablet DISSOLVE ONE TABLET UNDER TONGUE AS NEEDED FOR CHEST PAIN UP  TO 3 TABLETS (Patient not taking: Reported on 02/22/2021)   spironolactone (ALDACTONE) 25 MG tablet TAKE 1/2 TABLET EVERY DAY (Patient taking differently: 12.5 mg. Takes  a whole tablet daily)   No facility-administered encounter medications on file as  of 02/22/2021.    Patient Active Problem List   Diagnosis Date Noted   Acute renal failure superimposed on stage 3a chronic kidney disease ([place]) 12/08/2020   Osteoarthritis of right knee 12/08/2020   Routine general medical examination at a health care facility 07/20/2020   Acute bronchitis with COPD ([place]) 03/29/2020   BMI 31.0-31.9,adult 03/29/2020   Diabetic glomerulopathy ([place]) 11/27/2019   Needs flu shot 11/27/2019   ED (erectile dysfunction) 11/27/2019   Type 2 diabetes mellitus with vascular disease ([place])    Essential hypertension 07/20/2017   Atherosclerotic heart disease of native coronary artery with angina pectoris ([place]) 07/20/2017   Chronic diastolic heart failure ([place]) 07/20/2017   Peripheral vascular disease ([place]) 07/20/2017   DVT (deep venous thrombosis) ([place]) 07/20/2017   History of BPH 07/20/2017   Hyperlipidemia 07/20/2017    Conditions to be addressed/monitored:HTN and Chronic pain  Care Plan : Hypertension (Adult)  Updates made by Thana Ates, RN since 02/22/2021 12:00 AM     Problem: Hypertension (Hypertension)      Long-Range Goal: Hypertension Monitored   Start Date: 02/22/2021  Expected End Date: 08/04/2021  This Visit's Progress: On track  Priority: High  Note:   Objective:  Last practice recorded BP readings:  BP Readings from Last 3 Encounters:  12/08/20 90/60  08/09/20 110/68  07/20/20 128/68   Most recent eGFR/CrCl:  Lab Results  Component Value Date   EGFR 60 12/08/2020    No components found for: CRCL Current Barriers:  Not currently recording BP readings.  Case Manager Clinical Goal(s):  patient will verbalize understanding of plan for hypertension management patient will attend all scheduled medical appointments: Reviewed pending MD appointments patient will demonstrate improved adherence to prescribed treatment plan for hypertension as evidenced by taking all medications as prescribed, monitoring and recording blood pressure as  directed, adhering to low sodium/DASH diet patient will demonstrate improved health management independence as evidenced by checking blood pressure as directed and notifying PCP if SBP>140 or DBP > 90, taking all medications as prescribe, and adhering to a low sodium diet as discussed. Reviewed importance of staying hydrated. Encouraged patient to call MD for low less than 100 or high BP of greater than 140/90 Interventions Collaboration with Lillard Anes, MD regarding development and update of comprehensive plan of care as evidenced by provider attestation and co-signature Inter-disciplinary care team collaboration (see longitudinal plan of care) Evaluation of current treatment plan related to hypertension self management and patient's adherence to plan as established by provider. Provided education to patient re: stroke prevention, s/s of heart attack and stroke, DASH diet, complications of uncontrolled blood pressure Reviewed medications with patient and discussed importance of compliance Discussed plans with patient for ongoing care management follow up and provided patient with direct contact information for care management team Advised patient, providing education and rationale, to monitor blood pressure daily and record, calling PCP for findings outside established parameters.  Reviewed scheduled/upcoming provider appointments including:  Self-Care Activities: - Self administers medications as prescribed Attends all scheduled provider appointments Calls provider office for new concerns, questions, or BP outside discussed parameters Checks BP and records as discussed Follows a low sodium diet/DASH diet Patient Goals: - check blood pressure daily - write blood pressure  results in a log or diary Follow Up Plan: Telephone follow up appointment with care management team member scheduled for:  05/03/2021    Care Plan : Chronic Pain (Adult)  Updates made by Thana Ates, RN since  02/22/2021 12:00 AM     Problem: Chronic Pain Management (Chronic Pain)   Priority: Medium  Onset Date: 02/22/2021  Note:   Current Barriers:  Ineffective Self Health Maintenance in a patient with  Chronic pain as evidence by continued chronic pain of the knees  Clinical Goal(s):  Collaboration with Lillard Anes, MD regarding development and update of comprehensive plan of care as evidenced by provider attestation and co-signature Inter-disciplinary care team collaboration (see longitudinal plan of care) patient will work with care management team to address care coordination and chronic disease management needs related to Disease Management   Interventions:  Evaluation of current treatment plan related to  chronic pain ,  self-management and patient's adherence to plan as established by provider. Collaboration with Lillard Anes, MD regarding development and update of comprehensive plan of care as evidenced by provider attestation       and co-signature Inter-disciplinary care team collaboration (see longitudinal plan of care) Discussed plans with patient for ongoing care management follow up and provided patient with direct contact information for care management team Encouraged patient to stay active Reviewed OTC medications like tylenol for pain Encouraged patient to continue to use muscle/ joint rubs like bengay. Self Care Activities:  Attends all scheduled provider appointments Performs ADL's independently Performs IADL's independently Calls provider office for new concerns or questions Patient Goals: - develop a personal pain management plan - plan exercise or activity when pain is best controlled - track what makes the pain worse and what makes it better - use ice or heat for pain relief - work slower and less intense when having pain  Follow Up Plan: Telephone follow up appointment with care management team member scheduled for: 05/03/2021      Plan:Telephone follow up appointment with care management team member scheduled for:  05/03/2021  Tomasa Rand, RN, BSN, CEN RN Case Manager [facility] [phone]

## 2021-02-28 ENCOUNTER — Ambulatory Visit (INDEPENDENT_AMBULATORY_CARE_PROVIDER_SITE_OTHER): Payer: Medicare HMO

## 2021-02-28 DIAGNOSIS — Z111 Encounter for screening for respiratory tuberculosis: Secondary | ICD-10-CM

## 2021-03-02 ENCOUNTER — Ambulatory Visit: Payer: Medicare HMO

## 2021-03-02 ENCOUNTER — Other Ambulatory Visit: Payer: Self-pay

## 2021-03-02 LAB — TB SKIN TEST
Induration: 0 mm
TB Skin Test: NEGATIVE

## 2021-03-02 NOTE — Progress Notes (Signed)
Negative TB tests lp

## 2021-03-22 ENCOUNTER — Other Ambulatory Visit: Payer: Self-pay

## 2021-03-22 MED ORDER — NITROGLYCERIN 0.4 MG SL SUBL: SUBLINGUAL_TABLET | SUBLINGUAL | 5 refills | Status: DC

## 2021-03-22 NOTE — Telephone Encounter (Signed)
Pt requested only 25 tablets at a time.   Lorita Officer, CCMA 03/22/21 8:40 AM

## 2021-04-07 ENCOUNTER — Other Ambulatory Visit: Payer: Self-pay

## 2021-04-07 ENCOUNTER — Ambulatory Visit (INDEPENDENT_AMBULATORY_CARE_PROVIDER_SITE_OTHER): Payer: Medicare HMO | Admitting: Legal Medicine

## 2021-04-07 ENCOUNTER — Encounter: Payer: Self-pay | Admitting: Legal Medicine

## 2021-04-07 VITALS — BP 100/64 | HR 64 | Temp 97.8°F | Ht 68.0 in | Wt 200.6 lb

## 2021-04-07 DIAGNOSIS — E782 Mixed hyperlipidemia: Secondary | ICD-10-CM

## 2021-04-07 DIAGNOSIS — E1121 Type 2 diabetes mellitus with diabetic nephropathy: Secondary | ICD-10-CM | POA: Diagnosis not present

## 2021-04-07 DIAGNOSIS — I5032 Chronic diastolic (congestive) heart failure: Secondary | ICD-10-CM | POA: Diagnosis not present

## 2021-04-07 DIAGNOSIS — J440 Chronic obstructive pulmonary disease with acute lower respiratory infection: Secondary | ICD-10-CM | POA: Diagnosis not present

## 2021-04-07 DIAGNOSIS — J209 Acute bronchitis, unspecified: Secondary | ICD-10-CM | POA: Diagnosis not present

## 2021-04-07 DIAGNOSIS — Z6830 Body mass index (BMI) 30.0-30.9, adult: Secondary | ICD-10-CM

## 2021-04-07 DIAGNOSIS — E1159 Type 2 diabetes mellitus with other circulatory complications: Secondary | ICD-10-CM | POA: Diagnosis not present

## 2021-04-07 DIAGNOSIS — I10 Essential (primary) hypertension: Secondary | ICD-10-CM

## 2021-04-07 DIAGNOSIS — I25119 Atherosclerotic heart disease of native coronary artery with unspecified angina pectoris: Secondary | ICD-10-CM

## 2021-04-07 LAB — POCT UA - MICROALBUMIN: Microalbumin Ur, POC: 10 mg/L

## 2021-04-07 NOTE — Progress Notes (Addendum)
Established Patient Office Visit  Subjective:  Patient ID: Mark [name], male    DOB: 25-Sep-1945  Age: 75 y.o. MRN: [id number]  CC:  Chief Complaint  Patient presents with   Diabetes   Hypertension   Hyperlipidemia    HPI [name] presents for Chronic visit  Patient present with type 2 diabetes.  Specifically, this is type 2, noninsulin requiring diabetes, complicated by renal disease.  Compliance with treatment has been good; patient take medicines as directed, maintains diet and exercise regimen, follows up as directed, and is keeping glucose diary.  Date of  diagnosis 2010.  Depression screen has been performed.Tobacco screen nonsmoke. Current medicines for diabetes metformin.  Patient is on none for renal protection and atorvastatin for cholesterol control.  Patient performs foot exams daily and last ophthalmologic exam was yes.   Patient presents for follow up of hypertension.  Patient tolerating coreg,  well with side effects.  Patient was diagnosed with hypertension 2010 so has been treated for hypertension for 10 years.Patient is working on maintaining diet and exercise regimen and follows up as directed. Complication include cad.   Patient presents with hyperlipidemia.  Compliance with treatment has been good; patient takes medicines as directed, maintains low cholesterol diet, follows up as directed, and maintains exercise regimen.  Patient is using atorvastatin without problems.   Patient presents with HFpEF  that is stable. Diagnosis made 2013.  The course of the disease is stable.  Current medicines include coreg, spirnolactone. Patient follows a low cholesterol diet and maintains a weight diary.  Patient is on low salt, low cholesterol diet and avoids alcohol.  Patient denies adverse effects of medicines. Patient is monitoring weight and has loss of weight of weight.  Patient is having no pedal edema, no PND and no PND.  Patient is continuing to see cardiology.    Past Medical History:  Diagnosis Date   Arthritis    Chronic diastolic heart failure ([place])    DVT (deep venous thrombosis) (HCC)    Essential hypertension    History of BPH    Peripheral vascular disease ([place])     Past Surgical History:  Procedure Laterality Date   HERNIA REPAIR     INGUINAL HERNIA REPAIR Bilateral     History reviewed. No pertinent family history.  Social History   Socioeconomic History   Marital status: Single    Spouse name: Not on file   Number of children: 4   Years of education: 12   Highest education level: 12th grade  Occupational History   Occupation: retired   Tobacco Use   Smoking status: Never   Smokeless tobacco: Never  Vaping Use   Vaping Use: Never used  Substance and Sexual Activity   Alcohol use: No   Drug use: No   Sexual activity: Not Currently  Other Topics Concern   Not on file  Social History Narrative   Lives in a hotel and feels safe with his living arrangements.    Social Determinants of Health   Financial Resource Strain: Low Risk    Difficulty of Paying Living Expenses: Not very hard  Food Insecurity: No Food Insecurity   Worried About Charity fundraiser in the Last Year: Never true   Ran Out of Food in the Last Year: Never true  Transportation Needs: No Transportation Needs   Lack of Transportation (Medical): No   Lack of Transportation (Non-Medical): No  Physical Activity: Sufficiently Active   Days of Exercise  per Week: 4 days   Minutes of Exercise per Session: 150+ min  Stress: No Stress Concern Present   Feeling of Stress : Not at all  Social Connections: Moderately Integrated   Frequency of Communication with Friends and Family: Once a week   Frequency of Social Gatherings with Friends and Family: More than three times a week   Attends Religious Services: 1 to 4 times per year   Active Member of [employer] or Organizations: No   Attends Archivist Meetings: Never   Marital Status: Married   Human resources officer Violence: Not At Risk   Fear of Current or Ex-Partner: No   Emotionally Abused: No   Physically Abused: No   Sexually Abused: No    Outpatient Medications Prior to Visit  Medication Sig Dispense Refill   ACCU-CHEK AVIVA PLUS test strip TEST BLOOD SUGAR QD (Patient taking differently: 1 each daily before breakfast. TEST BLOOD SUGAR QD) 100 each 3   Accu-Chek Softclix Lancets lancets USE TO CHECK BLOOD SUGAR ONCE A DAY 100 each 3   albuterol (VENTOLIN HFA) 108 (90 Base) MCG/ACT inhaler      aspirin 81 MG chewable tablet Chew by mouth daily.     atorvastatin (LIPITOR) 20 MG tablet TAKE 1 TABLET EVERY DAY 90 tablet 2   Blood Glucose Monitoring Suppl (ACCU-CHEK AVIVA PLUS) w/Device KIT 1 each by Does not apply route every morning. 1 kit 0   carvedilol (COREG) 6.25 MG tablet TAKE 1 TABLET EVERY DAY 90 tablet 2   diclofenac Sodium (VOLTAREN) 1 % GEL APPLY 2 GRAMS EXTERNALLY TO THE AFFECTED AREA FOUR TIMES DAILY 200 g 6   furosemide (LASIX) 20 MG tablet TAKE 1/2 TABLET EVERY DAY (Patient taking differently: 20 mg every other day.) 45 tablet 2   Magnesium 400 MG TABS Take 1 tablet by mouth daily.     metFORMIN (GLUCOPHAGE) 500 MG tablet TAKE 1/2 TABLET TWICE DAILY (Patient taking differently: 500 mg daily.) 90 tablet 2   nitroGLYCERIN (NITROSTAT) 0.4 MG SL tablet DISSOLVE ONE TABLET UNDER TONGUE AS NEEDED FOR CHEST PAIN UP TO 3 TABLETS 25 tablet 5   spironolactone (ALDACTONE) 25 MG tablet TAKE 1/2 TABLET EVERY DAY (Patient taking differently: 12.5 mg. Takes  a whole tablet daily) 45 tablet 2   No facility-administered medications prior to visit.    Allergies  Allergen Reactions   Penicillins Rash    Can take by mouth, can not take injections will break out in a rash    ROS Review of Systems  Constitutional:  Negative for chills, fatigue and fever.  HENT:  Negative for congestion, ear pain and sore throat.   Respiratory:  Negative for cough and shortness of breath.    Cardiovascular:  Negative for chest pain.  Gastrointestinal:  Negative for abdominal pain, constipation, diarrhea, nausea and vomiting.  Endocrine: Negative for polydipsia, polyphagia and polyuria.  Genitourinary:  Negative for dysuria and frequency.  Musculoskeletal:  Negative for arthralgias and myalgias.  Neurological:  Negative for dizziness and headaches.  Psychiatric/Behavioral:  Negative for dysphoric mood.        No dysphoria     Objective:    Physical Exam Vitals reviewed.  Constitutional:      Appearance: Normal appearance. He is obese.  HENT:     Head: Normocephalic.     Right Ear: Tympanic membrane, ear canal and external ear normal.     Left Ear: Tympanic membrane, ear canal and external ear normal.  Eyes:  Extraocular Movements: Extraocular movements intact.     Conjunctiva/sclera: Conjunctivae normal.     Pupils: Pupils are equal, round, and reactive to light.  Cardiovascular:     Rate and Rhythm: Normal rate and regular rhythm.     Pulses: Normal pulses.     Heart sounds: Normal heart sounds. No murmur heard.   No gallop.  Pulmonary:     Effort: Pulmonary effort is normal. No respiratory distress.     Breath sounds: Normal breath sounds. No wheezing.  Abdominal:     General: Abdomen is flat. Bowel sounds are normal. There is no distension.     Palpations: Abdomen is soft.     Tenderness: There is no abdominal tenderness.  Musculoskeletal:        General: Normal range of motion.     Cervical back: Normal range of motion.  Skin:    General: Skin is warm.     Capillary Refill: Capillary refill takes less than 2 seconds.  Neurological:     General: No focal deficit present.     Mental Status: He is alert and oriented to [place], place, and time. Mental status is at baseline.  Psychiatric:        Mood and Affect: Mood normal.        Thought Content: Thought content normal.        Judgment: Judgment normal.    BP 100/64   Pulse 64   Temp 97.8 F  (36.6 C)   Ht $R'5\' 8"'fP$  (1.727 m)   Wt 200 lb 9.6 oz (91 kg)   SpO2 97%   BMI 30.50 kg/m  Wt Readings from Last 3 Encounters:  04/07/21 200 lb 9.6 oz (91 kg)  12/08/20 207 lb (93.9 kg)  08/09/20 202 lb (91.6 kg)   Diabetic Foot Exam - Simple   Simple Foot Form Diabetic Foot exam was performed with the following findings: Yes 04/07/2021  8:04 AM  Visual Inspection No deformities, no ulcerations, no other skin breakdown bilaterally: Yes Sensation Testing Intact to touch and monofilament testing bilaterally: Yes Pulse Check Posterior Tibialis and Dorsalis pulse intact bilaterally: Yes Comments      Health Maintenance Due  Topic Date Due   COVID-19 Vaccine (1) Never done   Hepatitis C Screening  Never done   TETANUS/TDAP  Never done   Zoster Vaccines- Shingrix (1 of 2) Never done   INFLUENZA VACCINE  03/14/2021   OPHTHALMOLOGY EXAM  03/31/2021    There are no preventive care reminders to display for this patient.  No results found for: TSH Lab Results  Component Value Date   WBC 5.0 12/08/2020   HGB 13.9 12/08/2020   HCT 43.7 12/08/2020   MCV 97 12/08/2020   PLT 208 12/08/2020   Lab Results  Component Value Date   NA 147 (H) 12/08/2020   K 4.7 12/08/2020   CO2 22 12/08/2020   GLUCOSE 115 (H) 12/08/2020   BUN 13 12/08/2020   CREATININE 1.25 12/08/2020   BILITOT 0.5 12/08/2020   ALKPHOS 63 12/08/2020   AST 18 12/08/2020   ALT 17 12/08/2020   PROT 7.1 12/08/2020   ALBUMIN 4.2 12/08/2020   CALCIUM 9.7 12/08/2020   EGFR 60 12/08/2020   Lab Results  Component Value Date   CHOL 145 12/08/2020   Lab Results  Component Value Date   HDL 47 12/08/2020   Lab Results  Component Value Date   LDLCALC 85 12/08/2020   Lab Results  Component Value Date  TRIG 66 12/08/2020   Lab Results  Component Value Date   CHOLHDL 3.1 12/08/2020   Lab Results  Component Value Date   HGBA1C 6.1 (H) 12/08/2020      Assessment & Plan:  Diagnoses and all orders for this  visit: Essential hypertension -     CBC with Differential -     Comprehensive metabolic panel An individual hypertension care plan was established and reinforced today.  The patient's status was assessed using clinical findings on exam and labs or diagnostic tests. The patient's success at meeting treatment goals on disease specific evidence-based guidelines and found to be well controlled. SELF MANAGEMENT: The patient and I together assessed ways to personally work towards obtaining the recommended goals. RECOMMENDATIONS: avoid decongestants found in common cold remedies, decrease consumption of alcohol, perform routine monitoring of BP with home BP cuff, exercise, reduction of dietary salt, take medicines as prescribed, try not to miss doses and quit smoking.  Regular exercise and maintaining a healthy weight is needed.  Stress reduction may help. A CLINICAL SUMMARY including written plan identify barriers to care unique to individual due to social or financial issues.  We attempt to mutually creat solutions for individual and family understanding.   Mixed hyperlipidemia -     Lipid panel AN INDIVIDUAL CARE PLAN for hyperlipidemia/ cholesterol was established and reinforced today.  The patient's status was assessed using clinical findings on exam, lab and other diagnostic tests. The patient's disease status was assessed based on evidence-based guidelines and found to be fair controlled. MEDICATIONS were reviewed. SELF MANAGEMENT GOALS have been discussed and patient's success at attaining the goal of low cholesterol was assessed. RECOMMENDATION given include regular exercise 3 days a week and low cholesterol/low fat diet. CLINICAL SUMMARY including written plan to identify barriers unique to the patient due to social or economic  reasons was discussed.   Type 2 diabetes mellitus with vascular disease ([place]) -     POCT UA - Microalbumin An individual care plan for diabetes was established and  reinforced today.  The patient's status was assessed using clinical findings on exam, labs and diagnostic testing. Patient success at meeting goals based on disease specific evidence-based guidelines and found to be good controlled. Medications were assessed and patient's understanding of the medical issues , including barriers were assessed. Recommend adherence to a diabetic diet, a graduated exercise program, HgbA1c level is checked quarterly, and urine microalbumin performed yearly .  Annual mono-filament sensation testing performed. Lower blood pressure and control hyperlipidemia is important. Get annual eye exams and annual flu shots and smoking cessation discussed.  Self management goals were discussed.   Atherosclerosis of native coronary artery of native heart with angina pectoris [facility]) An individual plan was formulated based on patient history and exam, labs and evidence based data. Patient has not had recent angina or nitroglycerin use. continue present treatment.   Chronic diastolic heart failure ([facility]CC) An individualized care plan was established and reinforced.  The patient's disease status was assessed using clinical finding son exam today, labs, and/or other diagnostic testing such as x-rays, to determine the patient's success in meeting treatmentgoalsbased on disease-based guidelines and found to beimproving. But not at goal yet. Medications prescriptions no changes Laboratory tests ordered to be performed today include routine labs. RECOMMENDATIONS: given include see cardiology.  Call physician is patient gains 3 lbs in one day or 5 lbs for one week.  Call for progressive PND, orthopnea or increased pedal edema.   Diabetic glomerulopathy (  [place]) An individual care plan for diabetes was established and reinforced today.  The patient's status was assessed using clinical findings on exam, labs and diagnostic testing. Patient success at meeting goals based on disease specific evidence-based  guidelines and found to be good controlled. Medications were assessed and patient's understanding of the medical issues , including barriers were assessed. Recommend adherence to a diabetic diet, a graduated exercise program, HgbA1c level is checked quarterly, and urine microalbumin performed yearly .  Annual mono-filament sensation testing performed. Lower blood pressure and control hyperlipidemia is important. Get annual eye exams and annual flu shots and smoking cessation discussed.  Self management goals were discussed.   Acute bronchitis with COPD An individualize plan was formulated for care of COPD.  Treatment is evidence based.  She will continue on inhalers, avoid smoking and smoke.  Regular exercise with help with dyspnea. Routine follow ups and medication compliance is needed.    BMI 30.0-30.9,adult  An individualize plan was formulated for obesity using patient history and physical exam to encourage weight loss.  An evidence based program was formulated.  Patient is to cut portion size, use smaller plates with meals and to plan physical exercise 3 days a week at least 20 minutes.  Weight watchers and other programs are helpful.  Planned amount of weight loss 10 lbs.   30 minute visit and review of old records    Follow-up: Return in about 4 months (around 08/07/2021) for fasting.    Reinaldo Meeker, MD

## 2021-04-08 ENCOUNTER — Ambulatory Visit: Payer: Medicare HMO | Admitting: Legal Medicine

## 2021-04-08 LAB — CBC WITH DIFFERENTIAL/PLATELET
Basophils Absolute: 0 10*3/uL (ref 0.0–0.2)
Basos: 1 %
EOS (ABSOLUTE): 0.1 10*3/uL (ref 0.0–0.4)
Eos: 3 %
Hematocrit: 42.6 % (ref 37.5–51.0)
Hemoglobin: 14.5 g/dL (ref 13.0–17.7)
Immature Grans (Abs): 0 10*3/uL (ref 0.0–0.1)
Immature Granulocytes: 0 %
Lymphocytes Absolute: 2.2 10*3/uL (ref 0.7–3.1)
Lymphs: 55 %
MCH: 30.7 pg (ref 26.6–33.0)
MCHC: 34 g/dL (ref 31.5–35.7)
MCV: 90 fL (ref 79–97)
Monocytes Absolute: 0.4 10*3/uL (ref 0.1–0.9)
Monocytes: 11 %
Neutrophils Absolute: 1.2 10*3/uL — ABNORMAL LOW (ref 1.4–7.0)
Neutrophils: 30 %
Platelets: 245 10*3/uL (ref 150–450)
RBC: 4.73 x10E6/uL (ref 4.14–5.80)
RDW: 13.4 % (ref 11.6–15.4)
WBC: 3.9 10*3/uL (ref 3.4–10.8)

## 2021-04-08 LAB — COMPREHENSIVE METABOLIC PANEL
ALT: 14 IU/L (ref 0–44)
AST: 17 IU/L (ref 0–40)
Albumin/Globulin Ratio: 1.6 (ref 1.2–2.2)
Albumin: 4.5 g/dL (ref 3.7–4.7)
Alkaline Phosphatase: 71 IU/L (ref 44–121)
BUN/Creatinine Ratio: 12 (ref 10–24)
BUN: 19 mg/dL (ref 8–27)
Bilirubin Total: 0.8 mg/dL (ref 0.0–1.2)
CO2: 20 mmol/L (ref 20–29)
Calcium: 9.9 mg/dL (ref 8.6–10.2)
Chloride: 106 mmol/L (ref 96–106)
Creatinine, Ser: 1.55 mg/dL — ABNORMAL HIGH (ref 0.76–1.27)
Globulin, Total: 2.8 g/dL (ref 1.5–4.5)
Glucose: 108 mg/dL — ABNORMAL HIGH (ref 65–99)
Potassium: 4.4 mmol/L (ref 3.5–5.2)
Sodium: 143 mmol/L (ref 134–144)
Total Protein: 7.3 g/dL (ref 6.0–8.5)
eGFR: 46 mL/min/{1.73_m2} — ABNORMAL LOW (ref >59)

## 2021-04-08 LAB — LIPID PANEL
Chol/HDL Ratio: 3.2 ratio (ref 0.0–5.0)
Cholesterol, Total: 152 mg/dL (ref 100–199)
HDL: 48 mg/dL (ref >39)
LDL Chol Calc (NIH): 87 mg/dL (ref 0–99)
Triglycerides: 91 mg/dL (ref 0–149)
VLDL Cholesterol Cal: 17 mg/dL (ref 5–40)

## 2021-04-08 LAB — CARDIOVASCULAR RISK ASSESSMENT

## 2021-04-08 NOTE — Progress Notes (Signed)
Cbc normal, glucose 108, kidney tests stage 3a, liver tests normal, Cholesterol normal,  lp

## 2021-04-09 ENCOUNTER — Other Ambulatory Visit: Payer: Self-pay | Admitting: Legal Medicine

## 2021-04-09 DIAGNOSIS — E1159 Type 2 diabetes mellitus with other circulatory complications: Secondary | ICD-10-CM

## 2021-04-20 ENCOUNTER — Other Ambulatory Visit: Payer: Self-pay | Admitting: Legal Medicine

## 2021-04-20 DIAGNOSIS — E1159 Type 2 diabetes mellitus with other circulatory complications: Secondary | ICD-10-CM

## 2021-04-25 ENCOUNTER — Other Ambulatory Visit: Payer: Self-pay

## 2021-04-25 NOTE — Patient Outreach (Signed)
[facility]) Care Management  04/25/2021  Mark [name] [birth year] [id number]   Case Closure    Patient noted to now be active with [facility] Embedded CCM team      Plan: RN CM will close case at this time.    Antionette Fairy, RN,BSN,CCM [facility] Care Management Telephonic Care Management Coordinator Direct Phone: [phone] Toll Free: [phone] Fax: [phone]

## 2021-05-03 ENCOUNTER — Ambulatory Visit (INDEPENDENT_AMBULATORY_CARE_PROVIDER_SITE_OTHER): Payer: Medicare HMO

## 2021-05-03 DIAGNOSIS — I10 Essential (primary) hypertension: Secondary | ICD-10-CM

## 2021-05-03 DIAGNOSIS — I5032 Chronic diastolic (congestive) heart failure: Secondary | ICD-10-CM

## 2021-05-03 DIAGNOSIS — E7849 Other hyperlipidemia: Secondary | ICD-10-CM

## 2021-05-03 DIAGNOSIS — E1159 Type 2 diabetes mellitus with other circulatory complications: Secondary | ICD-10-CM

## 2021-05-03 NOTE — Chronic Care Management (AMB) (Signed)
Chronic Care Management   CCM RN Visit Note  05/03/2021 Name: Mark [name] Noa MRN: [id number] DOB: Sep 02, 1945  Subjective: Mark [name] is a 75 y.o. year old male who is a primary care patient of Lillard Anes, MD. The care management team was consulted for assistance with disease management and care coordination needs.    Engaged with patient by telephone for follow up visit in response to provider referral for case management and/or care coordination services.   Consent to Services:  The patient was given information about Chronic Care Management services, agreed to services, and gave verbal consent prior to initiation of services.  Please see initial visit note for detailed documentation.   Patient agreed to services and verbal consent obtained.   Assessment: Review of patient past medical history, allergies, medications, health status, including review of consultants reports, laboratory and other test data, was performed as part of comprehensive evaluation and provision of chronic care management services.   SDOH (Social Determinants of Health) assessments and interventions performed:    CCM Care Plan  Allergies  Allergen Reactions   Penicillins Rash    Can take by mouth, can not take injections will break out in a rash    Outpatient Encounter Medications as of 05/03/2021  Medication Sig   ACCU-CHEK AVIVA PLUS test strip TEST BLOOD SUGAR EVERY DAY   Accu-Chek Softclix Lancets lancets TEST BLOOD SUGAR EVERY DAY   albuterol (VENTOLIN HFA) 108 (90 Base) MCG/ACT inhaler    aspirin 81 MG chewable tablet Chew by mouth daily.   atorvastatin (LIPITOR) 20 MG tablet TAKE 1 TABLET EVERY DAY   Blood Glucose Monitoring Suppl (ACCU-CHEK AVIVA PLUS) w/Device KIT 1 each by Does not apply route every morning.   carvedilol (COREG) 6.25 MG tablet TAKE 1 TABLET EVERY DAY   diclofenac Sodium (VOLTAREN) 1 % GEL APPLY 2 GRAMS EXTERNALLY TO THE AFFECTED AREA FOUR TIMES DAILY    furosemide (LASIX) 20 MG tablet TAKE 1/2 TABLET EVERY DAY (Patient taking differently: 20 mg every other day.)   Magnesium 400 MG TABS Take 1 tablet by mouth daily.   metFORMIN (GLUCOPHAGE) 500 MG tablet TAKE 1/2 TABLET TWICE DAILY   nitroGLYCERIN (NITROSTAT) 0.4 MG SL tablet DISSOLVE ONE TABLET UNDER TONGUE AS NEEDED FOR CHEST PAIN UP TO 3 TABLETS   spironolactone (ALDACTONE) 25 MG tablet TAKE 1/2 TABLET EVERY DAY (Patient taking differently: 12.5 mg. Takes  a whole tablet daily)   No facility-administered encounter medications on file as of 05/03/2021.    Patient Active Problem List   Diagnosis Date Noted   Acute renal failure superimposed on stage 3a chronic kidney disease ([place]) 12/08/2020   Osteoarthritis of right knee 12/08/2020   Routine general medical examination at a health care facility 07/20/2020   Acute bronchitis with COPD ([place]) 03/29/2020   BMI 30.0-30.9,adult 03/29/2020   Diabetic glomerulopathy ([place]) 11/27/2019   Needs flu shot 11/27/2019   ED (erectile dysfunction) 11/27/2019   Type 2 diabetes mellitus with vascular disease ([place])    Essential hypertension 07/20/2017   Atherosclerotic heart disease of native coronary artery with angina pectoris ([place]) 07/20/2017   Chronic diastolic heart failure ([place]) 07/20/2017   Peripheral vascular disease ([place]) 07/20/2017   DVT (deep venous thrombosis) ([place]) 07/20/2017   History of BPH 07/20/2017   Hyperlipidemia 07/20/2017    Conditions to be addressed/monitored:HTN, chronic pain  Care Plan : Hypertension (Adult)  Updates made by Thana Ates, RN since 05/03/2021 12:00 AM     Problem:  Hypertension (Hypertension)      Long-Range Goal: Hypertension Monitored   Start Date: 02/22/2021  Expected End Date: 08/04/2021  Recent Progress: On track  Priority: High  Note:   Objective:  Last practice recorded BP readings:  BP Readings from Last 3 Encounters:  04/07/21 100/64  12/08/20 90/60  08/09/20 110/68   Most recent  eGFR/CrCl:  Lab Results  Component Value Date   EGFR 46 (L) 04/07/2021    No components found for: CRCL Current Barriers:  Not currently recording BP readings.   Patient reports a family member is assisting with monitoring BP. Reports that he does not know what the numbers are but states he was told it was good.   Case Manager Clinical Goal(s):  patient will verbalize understanding of plan for hypertension management patient will attend all scheduled medical appointments: Reviewed pending MD appointments patient will demonstrate improved adherence to prescribed treatment plan for hypertension as evidenced by taking all medications as prescribed, monitoring and recording blood pressure as directed, adhering to low sodium/DASH diet patient will demonstrate improved health management independence as evidenced by checking blood pressure as directed and notifying PCP if SBP>140 or DBP > 90, taking all medications as prescribe, and adhering to a low sodium diet as discussed. Reviewed importance of staying hydrated. Encouraged patient to call MD for low less than 100 or high BP of greater than 140/90 Interventions Collaboration with Lillard Anes, MD regarding development and update of comprehensive plan of care as evidenced by provider attestation and co-signature Inter-disciplinary care team collaboration (see longitudinal plan of care) Evaluation of current treatment plan related to hypertension self management and patient's adherence to plan as established by provider. Provided education to patient re: stroke prevention, s/s of heart attack and stroke, DASH diet, complications of uncontrolled blood pressure Reviewed medications with patient and discussed importance of compliance Discussed plans with patient for ongoing care management follow up and provided patient with direct contact information for care management team Advised patient, providing education and rationale, to monitor blood  pressure daily and record, calling PCP for findings outside established parameters.  Reviewed scheduled/upcoming provider appointments. Encouraged patient to record BP readings on a calendar. Reviewed my contact information and scheduled next appointment for 08/02/2021 Self-Care Activities: - Self administers medications as prescribed Attends all scheduled provider appointments Calls provider office for new concerns, questions, or BP outside discussed parameters Checks BP and records as discussed Follows a low sodium diet/DASH diet Patient Goals: - check blood pressure daily - write blood pressure results in a log or diary Follow Up Plan: Telephone follow up appointment with care management team member scheduled for: 08/02/2021    Care Plan : Chronic Pain (Adult)  Updates made by Thana Ates, RN since 05/03/2021 12:00 AM     Problem: Chronic Pain Management (Chronic Pain)   Priority: Medium  Onset Date: 02/22/2021  Note:   Current Barriers:  Ineffective Self Health Maintenance in a patient with  Chronic pain as evidence by continued chronic pain of the knees  Reports decreased pain today. Reports he walked 5 miles yesterday.   Clinical Goal(s):  Collaboration with Lillard Anes, MD regarding development and update of comprehensive plan of care as evidenced by provider attestation and co-signature Inter-disciplinary care team collaboration (see longitudinal plan of care) patient will work with care management team to address care coordination and chronic disease management needs related to Disease Management   Interventions:  Evaluation of current treatment plan related to  chronic  pain ,  self-management and patient's adherence to plan as established by provider. Collaboration with Lillard Anes, MD regarding development and update of comprehensive plan of care as evidenced by provider attestation       and co-signature Inter-disciplinary care team collaboration  (see longitudinal plan of care) Discussed plans with patient for ongoing care management follow up and provided patient with direct contact information for care management team Encouraged patient to stay active Reviewed OTC medications like tylenol for pain Encouraged patient to continue to use muscle/ joint rubs like bengay. Self Care Activities:  Attends all scheduled provider appointments Performs ADL's independently Performs IADL's independently Calls provider office for new concerns or questions Patient Goals: - develop a personal pain management plan - plan exercise or activity when pain is best controlled - track what makes the pain worse and what makes it better - use ice or heat for pain relief - work slower and less intense when having pain  Follow Up Plan: Telephone follow up appointment with care management team member scheduled for: 08/02/2021     Plan:Telephone follow up appointment with care management team member scheduled for:  08/02/2021 Tomasa Rand RN, BSN, CEN RN Case Manager - [facility] [employer] Mobile: [phone]

## 2021-05-03 NOTE — Patient Instructions (Signed)
Visit Information  PATIENT GOALS:  Goals Addressed             This Visit's Progress    Manage Chronic Pain   On track    Timeframe:  Long-Range Goal Priority:  Medium Start Date:       02/22/2021                      Expected End Date:    08/04/2021             Follow Up Date 08/02/2021   - develop a personal pain management plan - plan exercise or activity when pain is best controlled - track what makes the pain worse and what makes it better - use ice or heat for pain relief - work slower and less intense when having pain    Why is this important?   Day-to-day life can be hard when you have chronic pain.  Pain medicine is just one piece of the treatment puzzle.  You can try these action steps to help you manage your pain.        Track and Manage My Blood Pressure-Hypertension   On track    Timeframe:  Long-Range Goal Priority:  High Start Date:      02/22/2021                       Expected End Date:      08/04/2021                 Follow Up Date 08/02/2021   - check blood pressure daily - choose a place to take my blood pressure (home, clinic or office, retail store)    Why is this important?   You won't feel high blood pressure, but it can still hurt your blood vessels.  High blood pressure can cause heart or kidney problems. It can also cause a stroke.  Making lifestyle changes like losing a little weight or eating less salt will help.  Checking your blood pressure at home and at different times of the day can help to control blood pressure.  If the doctor prescribes medicine remember to take it the way the doctor ordered.  Call the office if you cannot afford the medicine or if there are questions about it.             The patient verbalized understanding of instructions, educational materials, and care plan provided today and agreed to receive a mailed copy of patient instructions, educational materials, and care plan.   Telephone follow up appointment  with care management team member scheduled for:  08/02/2021  [name] RN, BSN, CEN RN Case Manager - [facility] Mobile: [phone]

## 2021-05-13 DIAGNOSIS — I10 Essential (primary) hypertension: Secondary | ICD-10-CM | POA: Diagnosis not present

## 2021-05-13 DIAGNOSIS — E7849 Other hyperlipidemia: Secondary | ICD-10-CM

## 2021-05-13 DIAGNOSIS — E1159 Type 2 diabetes mellitus with other circulatory complications: Secondary | ICD-10-CM | POA: Diagnosis not present

## 2021-05-13 DIAGNOSIS — I5032 Chronic diastolic (congestive) heart failure: Secondary | ICD-10-CM

## 2021-05-19 ENCOUNTER — Ambulatory Visit (INDEPENDENT_AMBULATORY_CARE_PROVIDER_SITE_OTHER): Payer: Medicare HMO | Admitting: Physician Assistant

## 2021-05-19 ENCOUNTER — Other Ambulatory Visit: Payer: Self-pay

## 2021-05-19 ENCOUNTER — Ambulatory Visit (INDEPENDENT_AMBULATORY_CARE_PROVIDER_SITE_OTHER): Payer: Medicare HMO

## 2021-05-19 ENCOUNTER — Encounter: Payer: Self-pay | Admitting: Legal Medicine

## 2021-05-19 ENCOUNTER — Encounter: Payer: Self-pay | Admitting: Physician Assistant

## 2021-05-19 VITALS — BP 100/68 | HR 75 | Temp 96.6°F | Ht 68.0 in | Wt 207.4 lb

## 2021-05-19 DIAGNOSIS — Z23 Encounter for immunization: Secondary | ICD-10-CM | POA: Diagnosis not present

## 2021-05-19 DIAGNOSIS — M545 Low back pain, unspecified: Secondary | ICD-10-CM | POA: Diagnosis not present

## 2021-05-19 LAB — POCT URINALYSIS DIP (CLINITEK)
Bilirubin, UA: NEGATIVE
Blood, UA: NEGATIVE
Glucose, UA: NEGATIVE mg/dL
Ketones, POC UA: NEGATIVE mg/dL
Leukocytes, UA: NEGATIVE
Nitrite, UA: NEGATIVE
Spec Grav, UA: 1.025 (ref 1.010–1.025)
Urobilinogen, UA: 0.2 E.U./dL
pH, UA: 5.5 (ref 5.0–8.0)

## 2021-05-19 MED ORDER — CYCLOBENZAPRINE HCL 5 MG PO TABS: 5.0000 mg | ORAL_TABLET | Freq: Every day | ORAL | 0 refills | Status: DC

## 2021-05-19 NOTE — Progress Notes (Signed)
Acute Office Visit  Subjective:    Patient ID: Mark [name], male    DOB: 1945/12/05, 75 y.o.   MRN: [id number]  Chief Complaint  Patient presents with   Back Pain    Lower     HPI Patient is in today for complaints of lower back pain intermittently for the past few days - states it is worse when he first gets up in the morning then pain resolves after taking ibuprofen Says he feels soreness with bending and taking deep breath Denies chest pain, dyspnea, abdominal pain Denies urine symptoms - no trouble with bms - last one this morning and normal Denies melena or hematochezia States here in office is not having any symptoms  While here pt would like to get his flu shot  Past Medical History:  Diagnosis Date   Arthritis    Chronic diastolic heart failure (HCC)    DVT (deep venous thrombosis) (HCC)    Essential hypertension    History of BPH    Peripheral vascular disease ([place])     Past Surgical History:  Procedure Laterality Date   HERNIA REPAIR     INGUINAL HERNIA REPAIR Bilateral     History reviewed. No pertinent family history.  Social History   Socioeconomic History   Marital status: Single    Spouse name: Not on file   Number of children: 4   Years of education: 12   Highest education level: 12th grade  Occupational History   Occupation: retired   Tobacco Use   Smoking status: Never   Smokeless tobacco: Never  Vaping Use   Vaping Use: Never used  Substance and Sexual Activity   Alcohol use: No   Drug use: No   Sexual activity: Not Currently  Other Topics Concern   Not on file  Social History Narrative   Lives in a hotel and feels safe with his living arrangements.    Social Determinants of Health   Financial Resource Strain: Low Risk    Difficulty of Paying Living Expenses: Not very hard  Food Insecurity: No Food Insecurity   Worried About Charity fundraiser in the Last Year: Never true   Ran Out of Food in the Last Year: Never true   Transportation Needs: No Transportation Needs   Lack of Transportation (Medical): No   Lack of Transportation (Non-Medical): No  Physical Activity: Sufficiently Active   Days of Exercise per Week: 4 days   Minutes of Exercise per Session: 150+ min  Stress: No Stress Concern Present   Feeling of Stress : Not at all  Social Connections: Moderately Integrated   Frequency of Communication with Friends and Family: Once a week   Frequency of Social Gatherings with Friends and Family: More than three times a week   Attends Religious Services: 1 to 4 times per year   Active Member of [employer] or Organizations: No   Attends Archivist Meetings: Never   Marital Status: Married  Human resources officer Violence: Not At Risk   Fear of Current or Ex-Partner: No   Emotionally Abused: No   Physically Abused: No   Sexually Abused: No    Outpatient Medications Prior to Visit  Medication Sig Dispense Refill   ACCU-CHEK AVIVA PLUS test strip TEST BLOOD SUGAR EVERY DAY 100 strip 3   Accu-Chek Softclix Lancets lancets TEST BLOOD SUGAR EVERY DAY 100 each 3   albuterol (VENTOLIN HFA) 108 (90 Base) MCG/ACT inhaler      aspirin 81  MG chewable tablet Chew by mouth daily.     atorvastatin (LIPITOR) 20 MG tablet TAKE 1 TABLET EVERY DAY 90 tablet 2   Blood Glucose Monitoring Suppl (ACCU-CHEK AVIVA PLUS) w/Device KIT 1 each by Does not apply route every morning. 1 kit 0   carvedilol (COREG) 6.25 MG tablet TAKE 1 TABLET EVERY DAY 90 tablet 2   diclofenac Sodium (VOLTAREN) 1 % GEL APPLY 2 GRAMS EXTERNALLY TO THE AFFECTED AREA FOUR TIMES DAILY 200 g 6   furosemide (LASIX) 20 MG tablet TAKE 1/2 TABLET EVERY DAY (Patient taking differently: 20 mg every other day.) 45 tablet 2   metFORMIN (GLUCOPHAGE) 500 MG tablet TAKE 1/2 TABLET TWICE DAILY 90 tablet 2   nitroGLYCERIN (NITROSTAT) 0.4 MG SL tablet DISSOLVE ONE TABLET UNDER TONGUE AS NEEDED FOR CHEST PAIN UP TO 3 TABLETS 25 tablet 5   spironolactone (ALDACTONE) 25  MG tablet TAKE 1/2 TABLET EVERY DAY (Patient taking differently: 12.5 mg. Takes  a whole tablet daily) 45 tablet 2   Magnesium 400 MG TABS Take 1 tablet by mouth daily.     No facility-administered medications prior to visit.    Allergies  Allergen Reactions   Penicillins Rash    Can take by mouth, can not take injections will break out in a rash    Review of Systems CONSTITUTIONAL: Negative for chills, fatigue, fever, unintentional weight gain and unintentional weight loss.  E/N/T: Negative for ear pain, nasal congestion and sore throat.  CARDIOVASCULAR: Negative for chest pain, dizziness, palpitations and pedal edema.  RESPIRATORY: Negative for recent cough and dyspnea.  GASTROINTESTINAL: Negative for abdominal pain, acid reflux symptoms, constipation, diarrhea, nausea and vomiting.  MSK: see HPI INTEGUMENTARY: Negative for rash.       Objective:    Physical Exam  BP 100/68 (BP Location: Left Arm, Patient Position: Sitting, Cuff Size: Normal)   Pulse 75   Temp (!) 96.6 F (35.9 C) (Temporal)   Ht _0  (1.727 m)   Wt 207 lb 6.4 oz (94.1 kg)   SpO2 95%   BMI 31.54 kg/m  Wt Readings from Last 3 Encounters:  05/19/21 207 lb 6.4 oz (94.1 kg)  04/07/21 200 lb 9.6 oz (91 kg)  12/08/20 207 lb (93.9 kg)  PHYSICAL EXAM:   VS: BP 100/68 (BP Location: Left Arm, Patient Position: Sitting, Cuff Size: Normal)   Pulse 75   Temp (!) 96.6 F (35.9 C) (Temporal)   Ht _1  (1.727 m)   Wt 207 lb 6.4 oz (94.1 kg)   SpO2 95%   BMI 31.54 kg/m   GEN: Well nourished, well developed, in no acute distress  Cardiac: RRR; no murmurs, rubs, or gallops,no edema -  Respiratory:  normal respiratory rate and pattern with no distress - normal breath sounds with no rales, rhonchi, wheezes or rubs GI: normal bowel sounds, no masses or tenderness MS: no deformity or atrophy  - tender to lower lumbar spine - bilaterally Skin: warm and dry, no rash   Office Visit on 05/19/2021  Component Date  Value Ref Range Status   Color, UA 05/19/2021 yellow  yellow Final   Clarity, UA 05/19/2021 clear  clear Final   Glucose, UA 05/19/2021 negative  negative mg/dL Final   Bilirubin, UA 05/19/2021 negative  negative Final   Ketones, POC UA 05/19/2021 negative  negative mg/dL Final   Spec Grav, UA 05/19/2021 1.025  1.010 - 1.025 Final   Blood, UA 05/19/2021 negative  negative Final   pH,  UA 05/19/2021 5.5  5.0 - 8.0 Final   POC PROTEIN,UA 05/19/2021 trace  negative, trace Final   Urobilinogen, UA 05/19/2021 0.2  0.2 or 1.0 E.U./dL Final   Nitrite, UA 05/19/2021 Negative  Negative Final   Leukocytes, UA 05/19/2021 Negative  Negative Final     Office Visit on 05/19/2021  Component Date Value Ref Range Status   Color, UA 05/19/2021 yellow  yellow Final   Clarity, UA 05/19/2021 clear  clear Final   Glucose, UA 05/19/2021 negative  negative mg/dL Final   Bilirubin, UA 05/19/2021 negative  negative Final   Ketones, POC UA 05/19/2021 negative  negative mg/dL Final   Spec Grav, UA 05/19/2021 1.025  1.010 - 1.025 Final   Blood, UA 05/19/2021 negative  negative Final   pH, UA 05/19/2021 5.5  5.0 - 8.0 Final   POC PROTEIN,UA 05/19/2021 trace  negative, trace Final   Urobilinogen, UA 05/19/2021 0.2  0.2 or 1.0 E.U./dL Final   Nitrite, UA 05/19/2021 Negative  Negative Final   Leukocytes, UA 05/19/2021 Negative  Negative Final     Health Maintenance Due  Topic Date Due   COVID-19 Vaccine (1) Never done   Hepatitis C Screening  Never done   TETANUS/TDAP  Never done   Zoster Vaccines- Shingrix (1 of 2) Never done   INFLUENZA VACCINE  03/14/2021   OPHTHALMOLOGY EXAM  03/31/2021    There are no preventive care reminders to display for this patient.   No results found for: TSH Lab Results  Component Value Date   WBC 3.9 04/07/2021   HGB 14.5 04/07/2021   HCT 42.6 04/07/2021   MCV 90 04/07/2021   PLT 245 04/07/2021   Lab Results  Component Value Date   NA 143 04/07/2021   K 4.4  04/07/2021   CO2 20 04/07/2021   GLUCOSE 108 (H) 04/07/2021   BUN 19 04/07/2021   CREATININE 1.55 (H) 04/07/2021   BILITOT 0.8 04/07/2021   ALKPHOS 71 04/07/2021   AST 17 04/07/2021   ALT 14 04/07/2021   PROT 7.3 04/07/2021   ALBUMIN 4.5 04/07/2021   CALCIUM 9.9 04/07/2021   EGFR 46 (L) 04/07/2021   Lab Results  Component Value Date   CHOL 152 04/07/2021   Lab Results  Component Value Date   HDL 48 04/07/2021   Lab Results  Component Value Date   LDLCALC 87 04/07/2021   Lab Results  Component Value Date   TRIG 91 04/07/2021   Lab Results  Component Value Date   CHOLHDL 3.2 04/07/2021   Lab Results  Component Value Date   HGBA1C 6.1 (H) 12/08/2020       Assessment & Plan:   Problem List Items Addressed This Visit   None Visit Diagnoses     Acute low back pain without sciatica, unspecified back pain laterality    -  Primary   Relevant Medications   cyclobenzaprine (FLEXERIL) 5 MG tablet   Other Relevant Orders   POCT URINALYSIS DIP (CLINITEK) (Completed)   Need for prophylactic vaccination and inoculation against influenza       Relevant Orders   Flu Vaccine QUAD High Dose(Fluad)      Meds ordered this encounter  Medications   cyclobenzaprine (FLEXERIL) 5 MG tablet    Sig: Take 1 tablet (5 mg total) by mouth at bedtime.    Dispense:  30 tablet    Refill:  0    Order Specific Question:   Supervising Provider    Answer:  COX, KIRSTEN [id number]    Orders Placed This Encounter  Procedures   Flu Vaccine QUAD High Dose(Fluad)   POCT URINALYSIS DIP (CLINITEK)     Follow-up: Return if symptoms worsen or fail to improve.  An After Visit Summary was printed and given to the patient.  Yetta Flock [facility] [phone]

## 2021-05-20 ENCOUNTER — Other Ambulatory Visit: Payer: Self-pay | Admitting: Legal Medicine

## 2021-06-17 ENCOUNTER — Other Ambulatory Visit: Payer: Self-pay | Admitting: Legal Medicine

## 2021-06-19 ENCOUNTER — Other Ambulatory Visit: Payer: Self-pay | Admitting: Legal Medicine

## 2021-07-21 ENCOUNTER — Encounter: Payer: Self-pay | Admitting: Legal Medicine

## 2021-07-21 ENCOUNTER — Ambulatory Visit (INDEPENDENT_AMBULATORY_CARE_PROVIDER_SITE_OTHER): Payer: Medicare HMO | Admitting: Legal Medicine

## 2021-07-21 ENCOUNTER — Other Ambulatory Visit: Payer: Self-pay

## 2021-07-21 VITALS — BP 128/80 | HR 66 | Temp 97.0°F | Ht 67.0 in | Wt 207.0 lb

## 2021-07-21 DIAGNOSIS — E1159 Type 2 diabetes mellitus with other circulatory complications: Secondary | ICD-10-CM

## 2021-07-21 DIAGNOSIS — Z0000 Encounter for general adult medical examination without abnormal findings: Secondary | ICD-10-CM | POA: Diagnosis not present

## 2021-07-21 LAB — HEMOGLOBIN A1C
Est. average glucose Bld gHb Est-mCnc: 134 mg/dL
Hgb A1c MFr Bld: 6.3 % — ABNORMAL HIGH (ref 4.8–5.6)

## 2021-07-21 NOTE — Progress Notes (Signed)
Subjective:   Mark [name] is a 75 y.o. male who presents for Medicare Annual/Subsequent preventive examination.  Review of Systems    Review of Systems  Constitutional:  Negative for chills and fever.  HENT:  Positive for congestion.   Eyes:  Negative for redness.  Respiratory:  Negative for cough and hemoptysis.   Cardiovascular:  Negative for chest pain and PND.  Gastrointestinal: Negative.   Genitourinary:  Negative for dysuria and frequency.  Musculoskeletal:  Negative for myalgias and neck pain.  Skin:  Negative for itching and rash.  Neurological: Negative.   Endo/Heme/Allergies: Negative.   Psychiatric/Behavioral: Negative.      Cardiac Risk Factors include: advanced age (>58mn, >>58women);diabetes mellitus     Objective:    Today's Vitals   07/21/21 0746  BP: 128/80  Pulse: 66  Temp: (!) 97 F (36.1 C)  TempSrc: Temporal  SpO2: 97%  Weight: 207 lb (93.9 kg)  Height: _0  (1.702 m)   Body mass index is 32.42 kg/m.  Advanced Directives 07/20/2020 04/18/2020 06/09/2019 05/17/2018 07/20/2017 07/12/2017  Does Patient Have a Medical Advance Directive? _1  No  Would patient like information on creating a medical advance directive? - - No - Patient declined No - Patient declined Yes (MAU/Ambulatory/Procedural Areas - Information given) No - Patient declined    Current Medications (verified) Outpatient Encounter Medications as of 07/21/2021  Medication Sig   ACCU-CHEK AVIVA PLUS test strip TEST BLOOD SUGAR EVERY DAY   Accu-Chek Softclix Lancets lancets TEST BLOOD SUGAR EVERY DAY   albuterol (VENTOLIN HFA) 108 (90 Base) MCG/ACT inhaler    aspirin 81 MG chewable tablet Chew by mouth daily.   atorvastatin (LIPITOR) 20 MG tablet TAKE 1 TABLET EVERY DAY   Blood Glucose Monitoring Suppl (ACCU-CHEK AVIVA PLUS) w/Device KIT 1 each by Does not apply route every morning.   carvedilol (COREG) 6.25 MG tablet TAKE 1 TABLET EVERY DAY   cyclobenzaprine (FLEXERIL)  5 MG tablet Take 1 tablet (5 mg total) by mouth at bedtime.   diclofenac Sodium (VOLTAREN) 1 % GEL APPLY 2 GRAMS EXTERNALLY TO THE AFFECTED AREA FOUR TIMES DAILY   furosemide (LASIX) 20 MG tablet Take 1 tablet (20 mg total) by mouth every other day.   metFORMIN (GLUCOPHAGE) 500 MG tablet TAKE 1/2 TABLET TWICE DAILY   nitroGLYCERIN (NITROSTAT) 0.4 MG SL tablet DISSOLVE ONE TABLET UNDER TONGUE AS NEEDED FOR CHEST PAIN UP TO 3 TABLETS   spironolactone (ALDACTONE) 25 MG tablet TAKE 1/2 TABLET EVERY DAY   No facility-administered encounter medications on file as of 07/21/2021.    Allergies (verified) Penicillins   History: Past Medical History:  Diagnosis Date   Arthritis    Chronic diastolic heart failure (HCC)    DVT (deep venous thrombosis) (HCC)    Essential hypertension    History of BPH    Peripheral vascular disease ([place]    Past Surgical History:  Procedure Laterality Date   HERNIA REPAIR     INGUINAL HERNIA REPAIR Bilateral    History reviewed. No pertinent family history. Social History   Socioeconomic History   Marital status: Single    Spouse name: Not on file   Number of children: 4   Years of education: 12   Highest education level: 12th grade  Occupational History   Occupation: retired   Tobacco Use   Smoking status: Never   Smokeless tobacco: Never  Vaping Use   Vaping Use: Never used  Substance and  Sexual Activity   Alcohol use: No   Drug use: No   Sexual activity: Not Currently  Other Topics Concern   Not on file  Social History Narrative   Lives in a hotel and feels safe with his living arrangements.    Social Determinants of Health   Financial Resource Strain: Low Risk    Difficulty of Paying Living Expenses: Not very hard  Food Insecurity: No Food Insecurity   Worried About Charity fundraiser in the Last Year: Never true   Ran Out of Food in the Last Year: Never true  Transportation Needs: No Transportation Needs   Lack of Transportation  (Medical): No   Lack of Transportation (Non-Medical): No  Physical Activity: Sufficiently Active   Days of Exercise per Week: 7 days   Minutes of Exercise per Session: 60 min  Stress: No Stress Concern Present   Feeling of Stress : Not at all  Social Connections: Socially Isolated   Frequency of Communication with Friends and Family: More than three times a week   Frequency of Social Gatherings with Friends and Family: Never   Attends Religious Services: Never   Marine scientist or Organizations: No   Attends Archivist Meetings: Never   Marital Status: Never married    Tobacco Counseling Counseling given: Not Answered   Clinical Intake:                 Diabetic?yes         Activities of Daily Living In your present state of health, do you have any difficulty performing the following activities: 07/21/2021  Hearing? N  Vision? N  Difficulty concentrating or making decisions? N  Walking or climbing stairs? N  Dressing or bathing? N  Doing errands, shopping? N  Preparing Food and eating ? N  Using the Toilet? N  In the past six months, have you accidently leaked urine? N  Do you have problems with loss of bowel control? N  Managing your Medications? N  Managing your Finances? N  Housekeeping or managing your Housekeeping? N  Some recent data might be hidden    Patient Care Team: Lillard Anes, MD as PCP - General (Family Medicine) Thana Ates, RN as [place] any recent [place] you may have received from other than [facility] providers in the past year (date may be approximate).     Assessment:   This is a routine wellness examination for [place].  Hearing/Vision screen No results found.  Dietary issues and exercise activities discussed: Current Exercise Habits: The patient does not participate in regular exercise at present, Exercise limited by: None identified   Goals  Addressed   None   Depression Screen PHQ 2/9 Scores 07/21/2021 04/07/2021 02/22/2021 07/20/2020 11/27/2019 06/09/2019 02/12/2019  PHQ - 2 Score 0 0 0 0 0 0 1    Fall Risk Fall Risk  04/07/2021 07/20/2020 12/22/2019 11/27/2019 10/16/2019  Falls in the past year? 0 0 1 - 1  Comment - - - - -  Number falls in past yr: 0 0 0 0 0  Injury with Fall? 0 0 0 0 1  Comment - - - - -  Risk Factor Category  - - - - -  Risk for fall due to : - No Fall Risks History of fall(s);Medication side effect;Impaired balance/gait - History of fall(s);Impaired balance/gait;Medication side effect  Follow up - - Education provided;Falls evaluation completed Falls evaluation completed Education  provided;Falls evaluation completed    FALL RISK PREVENTION PERTAINING TO THE HOME:  Any stairs in or around the home? Yes  If so, are there any without handrails? Yes  Home free of loose throw rugs in walkways, pet beds, electrical cords, etc? No  Adequate lighting in your home to reduce risk of falls? Yes   ASSISTIVE DEVICES UTILIZED TO PREVENT FALLS:  Life alert? No  Use of a cane, walker or w/c? No  Grab bars in the bathroom? Yes  Shower chair or bench in shower? No  Elevated toilet seat or a handicapped toilet? No   TIMED UP AND GO:  Was the test performed? Yes .  Length of time to ambulate 10 feet: 15 sec.   Gait steady and fast with assistive device  Cognitive Function:     6CIT Screen 07/21/2021 07/20/2020  What Year? 0 points 0 points  What month? 0 points 0 points  What time? 0 points 0 points  Count back from 20 0 points 0 points  Months in reverse 0 points 0 points  Repeat phrase 0 points 0 points  Total Score 0 0    Immunizations Immunization History  Administered Date(s) Administered   Fluad Quad(high Dose 65+) 07/20/2020, 05/19/2021   Influenza-Unspecified 10/30/2016, 06/14/2018, 07/23/2019   PPD Test 12/10/2018, 02/28/2021   Pfizer Covid-19 Vaccine Bivalent Booster 78yr & up 05/19/2021    Pneumococcal Conjugate-13 10/30/2016   Pneumococcal Polysaccharide-23 07/05/2018    TDAP status: Due, Education has been provided regarding the importance of this vaccine. Advised may receive this vaccine at local pharmacy or Health Dept. Aware to provide a copy of the vaccination record if obtained from local pharmacy or Health Dept. Verbalized acceptance and understanding.  Flu Vaccine status: Up to date  Pneumococcal vaccine status: Up to date  Covid-19 vaccine status: Completed vaccines  Qualifies for Shingles Vaccine? Yes   Zostavax completed No   Shingrix Completed?: No.    Education has been provided regarding the importance of this vaccine. Patient has been advised to call insurance company to determine out of pocket expense if they have not yet received this vaccine. Advised may also receive vaccine at local pharmacy or Health Dept. Verbalized acceptance and understanding.  Screening Tests Health Maintenance  Topic Date Due   TETANUS/TDAP  Never done   Zoster Vaccines- Shingrix (1 of 2) Never done   OPHTHALMOLOGY EXAM  03/31/2021   HEMOGLOBIN A1C  06/09/2021   COVID-19 Vaccine (2 - Pfizer series) 06/09/2021   Hepatitis C Screening  07/21/2022 (Originally 11/15/1963)   FOOT EXAM  04/07/2022   URINE MICROALBUMIN  04/07/2022   COLONOSCOPY (Pts 45-448yrInsurance coverage will need to be confirmed)  05/29/2022   Pneumonia Vaccine 6554Years old  Completed   INFLUENZA VACCINE  Completed   HPV VACCINES  Aged Out    Health Maintenance  Health Maintenance Due  Topic Date Due   TETANUS/TDAP  Never done   Zoster Vaccines- Shingrix (1 of 2) Never done   OPHTHALMOLOGY EXAM  03/31/2021   HEMOGLOBIN A1C  06/09/2021   COVID-19 Vaccine (2 - Pfizer series) 06/09/2021    Colorectal cancer screening: Type of screening: Colonoscopy. Completed 2013. Repeat every 10 years  Lung Cancer Screening: (Low Dose CT Chest recommended if Age 75-80ears, 30 pack-year currently smoking OR have  quit w/in 15years.) does not qualify.   Lung Cancer Screening Referral: no  Additional Screening:  Hepatitis C Screening: does not qualify; Completed na  Vision Screening: Recommended  annual ophthalmology exams for early detection of glaucoma and other disorders of the eye. Is the patient up to date with their annual eye exam?  Yes  Who is the provider or what is the name of the office in which the patient attends annual eye exams? [facility] If pt is not established with a provider, would they like to be referred to a provider to establish care? Yes .   Dental Screening: Recommended annual dental exams for proper oral hygiene  Community Resource Referral / Chronic Care Management: CRR required this visit?  No   CCM required this visit?  Yes      Plan:     I have personally reviewed and noted the following in the patient's chart:   Medical and social history Use of alcohol, tobacco or illicit drugs  Current medications and supplements including opioid prescriptions. Patient is not currently taking opioid prescriptions. Functional ability and status Nutritional status Physical activity Advanced directives List of other physicians Hospitalizations, surgeries, and ER visits in previous 12 months Vitals Screenings to include cognitive, depression, and falls Referrals and appointments  In addition, I have reviewed and discussed with patient certain preventive protocols, quality metrics, and best practice recommendations. A written personalized care plan for preventive services as well as general preventive health recommendations were provided to patient.     Reinaldo Meeker, MD   07/21/2021   Nurse Notes:

## 2021-07-22 NOTE — Progress Notes (Signed)
A1c 6.3 prediabetes level lp

## 2021-07-26 ENCOUNTER — Other Ambulatory Visit: Payer: Self-pay

## 2021-07-26 ENCOUNTER — Telehealth: Payer: Self-pay

## 2021-07-26 ENCOUNTER — Other Ambulatory Visit: Payer: Self-pay | Admitting: Legal Medicine

## 2021-07-26 MED ORDER — AZITHROMYCIN 250 MG PO TABS: ORAL_TABLET | ORAL | 0 refills | Status: AC

## 2021-07-26 MED ORDER — AZITHROMYCIN 250 MG PO TABS: ORAL_TABLET | ORAL | 0 refills | Status: DC

## 2021-07-26 NOTE — Telephone Encounter (Signed)
Antibiotic sent in lp

## 2021-07-26 NOTE — Telephone Encounter (Signed)
Patient calling as he has cough and congestion. He was seen 12/08 and congestion was documented at that time. He states this is ongoing for a month. He takes nyquil at night. He is requesting antibiotic.   Lorita Officer, [place] 07/26/21 8:53 AM

## 2021-07-26 NOTE — Telephone Encounter (Signed)
Made pt aware.   Lorita Officer, CCMA 07/26/21 12:00 PM

## 2021-07-26 NOTE — Telephone Encounter (Signed)
Sent to incorrect pharmacy. Resent.   Lorita Officer, CCMA 07/26/21 12:00 PM

## 2021-08-02 ENCOUNTER — Ambulatory Visit (INDEPENDENT_AMBULATORY_CARE_PROVIDER_SITE_OTHER): Payer: Medicare HMO

## 2021-08-02 DIAGNOSIS — E1159 Type 2 diabetes mellitus with other circulatory complications: Secondary | ICD-10-CM

## 2021-08-02 DIAGNOSIS — I10 Essential (primary) hypertension: Secondary | ICD-10-CM

## 2021-08-02 NOTE — Chronic Care Management (AMB) (Signed)
Chronic Care Management   CCM RN Visit Note  08/02/2021 Name: Mark [name] MRN: [id number] DOB: [birth year]  Subjective: Mark [name] is a 75 y.o. year old male who is a primary care patient of Lillard Anes, MD. The care management team was consulted for assistance with disease management and care coordination needs.    Engaged with patient by telephone for follow up visit in response to provider referral for case management and/or care coordination services.   Consent to Services:  The patient was given information about Chronic Care Management services, agreed to services, and gave verbal consent prior to initiation of services.  Please see initial visit note for detailed documentation.   Patient agreed to services and verbal consent obtained.   Assessment: Review of patient past medical history, allergies, medications, health status, including review of consultants reports, laboratory and other test data, was performed as part of comprehensive evaluation and provision of chronic care management services.   SDOH (Social Determinants of Health) assessments and interventions performed:  SDOH Interventions    Flowsheet Row Most Recent Value  SDOH Interventions   Housing Interventions Other (Comment)  [referral to Social worker]        CCM Care Plan  Allergies  Allergen Reactions   Penicillins Rash    Can take by mouth, can not take injections will break out in a rash    Outpatient Encounter Medications as of 08/02/2021  Medication Sig   ACCU-CHEK AVIVA PLUS test strip TEST BLOOD SUGAR EVERY DAY   Accu-Chek Softclix Lancets lancets TEST BLOOD SUGAR EVERY DAY   albuterol (VENTOLIN HFA) 108 (90 Base) MCG/ACT inhaler    aspirin 81 MG chewable tablet Chew by mouth daily.   atorvastatin (LIPITOR) 20 MG tablet TAKE 1 TABLET EVERY DAY   Blood Glucose Monitoring Suppl (ACCU-CHEK AVIVA PLUS) w/Device KIT 1 each by Does not apply route every morning.   carvedilol  (COREG) 6.25 MG tablet TAKE 1 TABLET EVERY DAY   cyclobenzaprine (FLEXERIL) 5 MG tablet Take 1 tablet (5 mg total) by mouth at bedtime.   diclofenac Sodium (VOLTAREN) 1 % GEL APPLY 2 GRAMS EXTERNALLY TO THE AFFECTED AREA FOUR TIMES DAILY   furosemide (LASIX) 20 MG tablet Take 1 tablet (20 mg total) by mouth every other day.   metFORMIN (GLUCOPHAGE) 500 MG tablet TAKE 1/2 TABLET TWICE DAILY   nitroGLYCERIN (NITROSTAT) 0.4 MG SL tablet DISSOLVE ONE TABLET UNDER TONGUE AS NEEDED FOR CHEST PAIN UP TO 3 TABLETS   spironolactone (ALDACTONE) 25 MG tablet TAKE 1/2 TABLET EVERY DAY   No facility-administered encounter medications on file as of 08/02/2021.    Patient Active Problem List   Diagnosis Date Noted   Acute renal failure superimposed on stage 3a chronic kidney disease ([place]) 12/08/2020   Osteoarthritis of right knee 12/08/2020   Routine general medical examination at a health care facility 07/20/2020   Acute bronchitis with COPD ([place]) 03/29/2020   BMI 30.0-30.9,adult 03/29/2020   Diabetic glomerulopathy ([place]) 11/27/2019   Needs flu shot 11/27/2019   ED (erectile dysfunction) 11/27/2019   Type 2 diabetes mellitus with vascular disease ([place])    Essential hypertension 07/20/2017   Atherosclerotic heart disease of native coronary artery with angina pectoris ([place]) 07/20/2017   Chronic diastolic heart failure ([place]) 07/20/2017   Peripheral vascular disease ([place]) 07/20/2017   DVT (deep venous thrombosis) ([place]) 07/20/2017   History of BPH 07/20/2017   Hyperlipidemia 07/20/2017    Conditions to be addressed/monitored:HTN and DMII  Care  Plan : RN care manager plan of care  Updates made by Rockne Menghini, RN since 08/02/2021 12:00 AM     Problem: No plan of care established for management of chronic disease states ( DM, Hypertension)   Priority: High     Long-Range Goal: Development of plan of care for chronic disease management ( DM, HTN)   Start Date: 08/02/2021  Expected End Date:  08/02/2022  Priority: High  Note:   Current Barriers:  Chronic Disease Management support and education needs related to HTN and DMII. 08/02/2021 Patient reports that he is taking all his medications as prescribed. Is not self monitoring BP.   Patient reports that he will not have any where to live after 08/14/2021.  Denies pain today.   RNCM Clinical Goal(s):  Patient will take all medications exactly as prescribed and will call provider for medication related questions as evidenced by patient report    attend all scheduled medical appointments: PCP, care management as evidenced by patient report        demonstrate ongoing adherence to prescribed treatment plan for HTN and DMII as evidenced by patient report and labs  through collaboration with RN Care manager, provider, and care team.   Interventions: 1:1 collaboration with primary care provider regarding development and update of comprehensive plan of care as evidenced by provider attestation and co-signature Inter-disciplinary care team collaboration (see longitudinal plan of care) Evaluation of current treatment plan related to  self management and patient's adherence to plan as established by provider   Diabetes:  (Status: New goal.) Long Term Goal   Lab Results  Component Value Date   HGBA1C 6.3 (H) 07/21/2021  Assessed patient's understanding of A1c goal: <6.5% Reviewed medications with patient and discussed importance of medication adherence;        Reviewed prescribed diet with patient reviewed importance of reducing sweets; Counseled on importance of regular laboratory monitoring as prescribed;        Discussed plans with patient for ongoing care management follow up and provided patient with direct contact information for care management team;      Referral made to social work team for assistance with housing ;      Assessed social determinant of health barriers;         Hypertension: (Status: New goal.) Last practice  recorded BP readings:  BP Readings from Last 3 Encounters:  07/21/21 128/80  05/19/21 100/68  04/07/21 100/64  Most recent eGFR/CrCl:  Lab Results  Component Value Date   EGFR 46 (L) 04/07/2021    No components found for: CRCL  Evaluation of current treatment plan related to hypertension self management and patient's adherence to plan as established by provider;   Reviewed medications with patient and discussed importance of compliance;  Encouraged patient to monitor BP weekly and record. Encouraged patient to BP readings to MD appointments  Patient Goals/Self-Care Activities: Take medications as prescribed   Attend all scheduled provider appointments Call pharmacy for medication refills 3-7 days in advance of running out of medications Call provider office for new concerns or questions  wash and dry feet carefully every day check blood pressure weekly choose a place to take my blood pressure (home, clinic or office, retail store) write blood pressure results in a log or diary keep a blood pressure log take blood pressure log to all doctor appointments call doctor for signs and symptoms of high blood pressure keep all doctor appointments take medications for blood pressure exactly as prescribed  Plan:Telephone follow up appointment with care management team member scheduled for:  [id number]/[id number] Tomasa Rand RN, BSN, CEN RN Case Manager - [facility] Museum/gallery exhibitions officer Mobile: [phone]

## 2021-08-02 NOTE — Patient Instructions (Signed)
Visit Information  Thank you for taking time to visit with me today. Please don't hesitate to contact me if I can be of assistance to you before our next scheduled telephone appointment.  Following are the goals we discussed today:  Take medications as prescribed   Attend all scheduled provider appointments Call pharmacy for medication refills 3-7 days in advance of running out of medications Call provider office for new concerns or questions  wash and dry feet carefully every day check blood pressure weekly choose a place to take my blood pressure (home, clinic or office, retail store) write blood pressure results in a log or diary keep a blood pressure log take blood pressure log to all doctor appointments call doctor for signs and symptoms of high blood pressure keep all doctor appointments take medications for blood pressure exactly as prescribed  Our next appointment is by telephone on 11/01/2021 at 1:30pm  Please call the care guide team at [phone] if you need to cancel or reschedule your appointment.   If you are experiencing a Mental Health or Behavioral Health Crisis or need someone to talk to, please call the Suicide and Crisis Lifeline: 988 call the [place] National Suicide Prevention Lifeline: (639)103-8569 or TTY: 986-309-7812 TTY 2173565165) to talk to a trained counselor call 1-800-273-TALK (toll free, 24 hour hotline) call 911   The patient verbalized understanding of instructions, educational materials, and care plan provided today and declined offer to receive copy of patient instructions, educational materials, and care plan.   [name] RN, BSN, CEN RN Case Production designer, theatre/television/film - [facility]ic Mobile: [phone]

## 2021-08-03 ENCOUNTER — Telehealth: Payer: Self-pay | Admitting: *Deleted

## 2021-08-03 NOTE — Patient Outreach (Signed)
Received a referral from [name], RN for Social Work needs for Mr. Maragh.  [facility] Designer, fashion/clothing is a Nurse, adult, I have transferred the message to the [facility] Embedded Care Guide team.

## 2021-08-03 NOTE — Chronic Care Management (AMB) (Signed)
°  Chronic Care Management   Note  08/03/2021 Name: Mark [name] MRN: [id number] DOB: 1945/12/07  Mark [name] is a 75 y.o. year old male who is a primary care patient of Abigail Miyamoto, MD. Mark [name] is currently enrolled in care management services. An additional referral for Licensed Clinical SW was placed.   Follow up plan: Telephone appointment with care management team member scheduled for: 09/02/2021  Burman Nieves, CCMA Care Guide, Embedded Care Coordination [facility]   Care Management  Direct Dial: [phone]

## 2021-08-13 DIAGNOSIS — E1159 Type 2 diabetes mellitus with other circulatory complications: Secondary | ICD-10-CM

## 2021-08-13 DIAGNOSIS — Z7984 Long term (current) use of oral hypoglycemic drugs: Secondary | ICD-10-CM

## 2021-08-13 DIAGNOSIS — I10 Essential (primary) hypertension: Secondary | ICD-10-CM | POA: Diagnosis not present

## 2021-08-17 ENCOUNTER — Other Ambulatory Visit: Payer: Self-pay

## 2021-08-17 ENCOUNTER — Ambulatory Visit (INDEPENDENT_AMBULATORY_CARE_PROVIDER_SITE_OTHER): Payer: Medicare HMO | Admitting: Legal Medicine

## 2021-08-17 ENCOUNTER — Encounter: Payer: Self-pay | Admitting: Legal Medicine

## 2021-08-17 VITALS — BP 128/76 | HR 82 | Temp 97.6°F | Ht 67.0 in | Wt 204.0 lb

## 2021-08-17 DIAGNOSIS — J440 Chronic obstructive pulmonary disease with acute lower respiratory infection: Secondary | ICD-10-CM

## 2021-08-17 DIAGNOSIS — I25119 Atherosclerotic heart disease of native coronary artery with unspecified angina pectoris: Secondary | ICD-10-CM | POA: Diagnosis not present

## 2021-08-17 DIAGNOSIS — E1121 Type 2 diabetes mellitus with diabetic nephropathy: Secondary | ICD-10-CM | POA: Diagnosis not present

## 2021-08-17 DIAGNOSIS — E7849 Other hyperlipidemia: Secondary | ICD-10-CM | POA: Diagnosis not present

## 2021-08-17 DIAGNOSIS — Z6831 Body mass index (BMI) 31.0-31.9, adult: Secondary | ICD-10-CM | POA: Diagnosis not present

## 2021-08-17 DIAGNOSIS — I739 Peripheral vascular disease, unspecified: Secondary | ICD-10-CM

## 2021-08-17 DIAGNOSIS — J209 Acute bronchitis, unspecified: Secondary | ICD-10-CM

## 2021-08-17 DIAGNOSIS — I5032 Chronic diastolic (congestive) heart failure: Secondary | ICD-10-CM | POA: Diagnosis not present

## 2021-08-17 DIAGNOSIS — I10 Essential (primary) hypertension: Secondary | ICD-10-CM

## 2021-08-17 DIAGNOSIS — E1159 Type 2 diabetes mellitus with other circulatory complications: Secondary | ICD-10-CM | POA: Diagnosis not present

## 2021-08-17 NOTE — Progress Notes (Signed)
Established Patient Office Visit  Subjective:  Patient ID: Mark [name], male    DOB: [birth year]  Age: 76 y.o. MRN: [id number]  CC:  Chief Complaint  Patient presents with   Hyperlipidemia   Hypertension   Diabetes    HPI Mark [name] presents for chronic visit  Patient present with type 2 diabetes.  Specifically, this is type 2, noninsulin requiring diabetes, complicated by vascualr disease.  Compliance with treatment has been good; patient take medicines as directed, maintains diet and exercise regimen, follows up as directed, and is keeping glucose diary.  Date of  diagnosis 2010.  Depression screen has been performed.Tobacco screen nonsmoker. Current medicines for diabetes metformin.  Patient is on none for renal protection and atorvastatin for cholesterol control.  Patient performs foot exams daily and last ophthalmologic exam was no.   CORONARY ARTERY DISEASE  Patient presents in follow up of CAD. Patient was diagnosed in 2010. The patient has  associated CHF. The patient is currently taking a beta blocker, statin, and aspirin. CAD was diagnosed 12 years ago.  Patient is having no angina. Patient has used no NTG.  Patient is followed by cardiology.  Patient had no stents, recommended stent . Last angiography was 2010, last echocardiogram na  .  Patient presents with HFrEF  that is stable. Diagnosis made 2010.  The course of the disease is stable.  Current medicines include spironolactone, NTG, coreg. Patient follows a low cholesterol diet and maintains a weight diary.  Patient is on low salt, low cholesterol diet and avoids alcohol.  Patient denies adverse effects of medicines. Patient is monitoring weight and has list 3 lbs of weight.  Patient is having no pedal edema, no PND and no PND.  Patient is continuing to see cardiology.   Patient presents with hyperlipidemia.  Compliance with treatment has been good; patient takes medicines as directed, maintains low cholesterol  diet, follows up as directed, and maintains exercise regimen.  Patient is using atorvastatin without problems.    Past Medical History:  Diagnosis Date   Arthritis    Chronic diastolic heart failure (HCC)    DVT (deep venous thrombosis) (HCC)    Essential hypertension    History of BPH    Peripheral vascular disease (HCC)     Past Surgical History:  Procedure Laterality Date   HERNIA REPAIR     INGUINAL HERNIA REPAIR Bilateral     History reviewed. No pertinent family history.  Social History   Socioeconomic History   Marital status: Single    Spouse name: Not on file   Number of children: 4   Years of education: 12   Highest education level: 12th grade  Occupational History   Occupation: retired   Tobacco Use   Smoking status: Never   Smokeless tobacco: Never  Vaping Use   Vaping Use: Never used  Substance and Sexual Activity   Alcohol use: No   Drug use: No   Sexual activity: Not Currently  Other Topics Concern   Not on file  Social History Narrative   Lives in a hotel and feels safe with his living arrangements.    Social Determinants of Health   Financial Resource Strain: Low Risk    Difficulty of Paying Living Expenses: Not very hard  Food Insecurity: No Food Insecurity   Worried About Running Out of Food in the Last Year: Never true   Ran Out of Food in the Last Year: Never true  Transportation Needs: No  Transportation Needs   Lack of Transportation (Medical): No   Lack of Transportation (Non-Medical): No  Physical Activity: Sufficiently Active   Days of Exercise per Week: 7 days   Minutes of Exercise per Session: 60 min  Stress: No Stress Concern Present   Feeling of Stress : Not at all  Social Connections: Socially Isolated   Frequency of Communication with Friends and Family: More than three times a week   Frequency of Social Gatherings with Friends and Family: Never   Attends Religious Services: Never   Database administrator or Organizations:  No   Attends Engineer, structural: Never   Marital Status: Never married  Catering manager Violence: Not At Risk   Fear of Current or Ex-Partner: No   Emotionally Abused: No   Physically Abused: No   Sexually Abused: No    Outpatient Medications Prior to Visit  Medication Sig Dispense Refill   ACCU-CHEK AVIVA PLUS test strip TEST BLOOD SUGAR EVERY DAY 100 strip 3   Accu-Chek Softclix Lancets lancets TEST BLOOD SUGAR EVERY DAY 100 each 3   albuterol (VENTOLIN HFA) 108 (90 Base) MCG/ACT inhaler      aspirin 81 MG chewable tablet Chew by mouth daily.     atorvastatin (LIPITOR) 20 MG tablet TAKE 1 TABLET EVERY DAY 90 tablet 2   Blood Glucose Monitoring Suppl (ACCU-CHEK AVIVA PLUS) w/Device KIT 1 each by Does not apply route every morning. 1 kit 0   carvedilol (COREG) 6.25 MG tablet TAKE 1 TABLET EVERY DAY 90 tablet 2   cyclobenzaprine (FLEXERIL) 5 MG tablet Take 1 tablet (5 mg total) by mouth at bedtime. 30 tablet 0   diclofenac Sodium (VOLTAREN) 1 % GEL APPLY 2 GRAMS EXTERNALLY TO THE AFFECTED AREA FOUR TIMES DAILY 200 g 6   furosemide (LASIX) 20 MG tablet Take 1 tablet (20 mg total) by mouth every other day. 45 tablet 1   metFORMIN (GLUCOPHAGE) 500 MG tablet TAKE 1/2 TABLET TWICE DAILY 90 tablet 2   nitroGLYCERIN (NITROSTAT) 0.4 MG SL tablet DISSOLVE ONE TABLET UNDER TONGUE AS NEEDED FOR CHEST PAIN UP TO 3 TABLETS 25 tablet 5   spironolactone (ALDACTONE) 25 MG tablet TAKE 1/2 TABLET EVERY DAY 45 tablet 2   No facility-administered medications prior to visit.    Allergies  Allergen Reactions   Penicillins Rash    Can take by mouth, can not take injections will break out in a rash    ROS Review of Systems  Constitutional:  Negative for activity change and appetite change.  HENT:  Positive for congestion.   Respiratory:  Negative for chest tightness and shortness of breath.   Cardiovascular:  Negative for chest pain, palpitations and leg swelling.  Gastrointestinal:   Negative for abdominal distention and abdominal pain.  Genitourinary: Negative.   Musculoskeletal:  Negative for arthralgias and back pain.  Neurological: Negative.   Psychiatric/Behavioral: Negative.       Objective:    Physical Exam Vitals reviewed.  Constitutional:      General: He is not in acute distress.    Appearance: Normal appearance.  HENT:     Head: Normocephalic.     Right Ear: Tympanic membrane normal.     Left Ear: Tympanic membrane normal.     Nose: Nose normal.     Mouth/Throat:     Mouth: Mucous membranes are moist.     Pharynx: Oropharynx is clear.  Eyes:     Extraocular Movements: Extraocular movements intact.  Conjunctiva/sclera: Conjunctivae normal.     Pupils: Pupils are equal, round, and reactive to light.  Cardiovascular:     Rate and Rhythm: Normal rate and regular rhythm.     Pulses: Normal pulses.     Heart sounds: Normal heart sounds. No murmur heard.   No gallop.  Pulmonary:     Effort: Pulmonary effort is normal. No respiratory distress.     Breath sounds: Normal breath sounds. No wheezing.  Abdominal:     General: Abdomen is flat. Bowel sounds are normal. There is no distension.     Palpations: Abdomen is soft.     Tenderness: There is no abdominal tenderness.  Musculoskeletal:        General: Normal range of motion.     Cervical back: Normal range of motion and neck supple.     Right lower leg: No edema.     Left lower leg: No edema.  Skin:    General: Skin is warm.     Capillary Refill: Capillary refill takes less than 2 seconds.  Neurological:     General: No focal deficit present.     Mental Status: He is alert and oriented to [place], place, and time. Mental status is at baseline.  Psychiatric:        Mood and Affect: Mood normal.        Thought Content: Thought content normal.    BP 128/76    Pulse 82    Temp 97.6 F (36.4 C)    Ht $R'5\' 7"'Ie$  (1.702 m)    Wt 204 lb (92.5 kg)    SpO2 100%    BMI 31.95 kg/m  Wt Readings from  Last 3 Encounters:  08/17/21 204 lb (92.5 kg)  07/21/21 207 lb (93.9 kg)  05/19/21 207 lb 6.4 oz (94.1 kg)     Health Maintenance Due  Topic Date Due   TETANUS/TDAP  Never done   Zoster Vaccines- Shingrix (1 of 2) Never done   OPHTHALMOLOGY EXAM  03/31/2021   COVID-19 Vaccine (2 - Pfizer series) 06/09/2021    There are no preventive care reminders to display for this patient.  No results found for: TSH Lab Results  Component Value Date   WBC 3.9 04/07/2021   HGB 14.5 04/07/2021   HCT 42.6 04/07/2021   MCV 90 04/07/2021   PLT 245 04/07/2021   Lab Results  Component Value Date   NA 143 04/07/2021   K 4.4 04/07/2021   CO2 20 04/07/2021   GLUCOSE 108 (H) 04/07/2021   BUN 19 04/07/2021   CREATININE 1.55 (H) 04/07/2021   BILITOT 0.8 04/07/2021   ALKPHOS 71 04/07/2021   AST 17 04/07/2021   ALT 14 04/07/2021   PROT 7.3 04/07/2021   ALBUMIN 4.5 04/07/2021   CALCIUM 9.9 04/07/2021   EGFR 46 (L) 04/07/2021   Lab Results  Component Value Date   CHOL 152 04/07/2021   Lab Results  Component Value Date   HDL 48 04/07/2021   Lab Results  Component Value Date   LDLCALC 87 04/07/2021   Lab Results  Component Value Date   TRIG 91 04/07/2021   Lab Results  Component Value Date   CHOLHDL 3.2 04/07/2021   Lab Results  Component Value Date   HGBA1C 6.3 (H) 07/21/2021      Assessment & Plan:   Problem List Items Addressed This Visit       Cardiovascular and Mediastinum   Essential hypertension - Primary   Relevant Orders  CBC with Differential/Platelet   Comprehensive metabolic panel An individual hypertension care plan was established and reinforced today.  The patient's status was assessed using clinical findings on exam and labs or diagnostic tests. The patient's success at meeting treatment goals on disease specific evidence-based guidelines and found to be fair controlled. SELF MANAGEMENT: The patient and I together assessed ways to personally work  towards obtaining the recommended goals. RECOMMENDATIONS: avoid decongestants found in common cold remedies, decrease consumption of alcohol, perform routine monitoring of BP with home BP cuff, exercise, reduction of dietary salt, take medicines as prescribed, try not to miss doses and quit smoking.  Regular exercise and maintaining a healthy weight is needed.  Stress reduction may help. A CLINICAL SUMMARY including written plan identify barriers to care unique to individual due to social or financial issues.  We attempt to mutually creat solutions for individual and family understanding.     Atherosclerotic heart disease of native coronary artery with angina pectoris [facility]) An individual plan was formulated based on patient history and exam, labs and evidence based data. Patient has not had recent angina or nitroglycerin use. continue present treatment. I recommended he see cardiology    Chronic diastolic heart failure [facility]) An individualized care plan was established and reinforced.  The patient's disease status was assessed using clinical finding son exam today, labs, and/or other diagnostic testing such as x-rays, to determine the patient's success in meeting treatmentgoalsbased on disease-based guidelines and found to be improving. But not at goal yet. Medications prescriptions no changes Laboratory tests ordered to be performed today include routine. RECOMMENDATIONS: given include see cardiology.  Call physician is patient gains 3 lbs in one day or 5 lbs for one week.  Call for progressive PND, orthopnea or increased pedal edema.     Peripheral vascular disease ([place]) Patient has PAD, no recent claudication    Type 2 diabetes mellitus with vascular disease ([place])   Relevant Orders   Hemoglobin A1c An individual care plan for diabetes was established and reinforced today.  The patient's status was assessed using clinical findings on exam, labs and diagnostic testing. Patient success at meeting  goals based on disease specific evidence-based guidelines and found to be good controlled. Medications were assessed and patient's understanding of the medical issues , including barriers were assessed. Recommend adherence to a diabetic diet, a graduated exercise program, HgbA1c level is checked quarterly, and urine microalbumin performed yearly .  Annual mono-filament sensation testing performed. Lower blood pressure and control hyperlipidemia is important. Get annual eye exams and annual flu shots and smoking cessation discussed.  Self management goals were discussed.      Respiratory   Acute bronchitis with COPD [facility]) An individualize plan was formulated for care of COPD.  Treatment is evidence based.  She will continue on inhalers, avoid smoking and smoke.  Regular exercise with help with dyspnea. Routine follow ups and medication compliance is needed.      Endocrine   Diabetic glomerulopathy [facility]) An individual care plan for diabetes was established and reinforced today.  The patient's status was assessed using clinical findings on exam, labs and diagnostic testing. Patient success at meeting goals based on disease specific evidence-based guidelines and found to be good controlled. Medications were assessed and patient's understanding of the medical issues , including barriers were assessed. Recommend adherence to a diabetic diet, a graduated exercise program, HgbA1c level is checked quarterly, and urine microalbumin performed yearly .  Annual mono-filament sensation testing performed. Lower blood  pressure and control hyperlipidemia is important. Get annual eye exams and annual flu shots and smoking cessation discussed.  Self management goals were discussed.      Other   Hyperlipidemia   Relevant Orders   Lipid panel AN INDIVIDUAL CARE PLAN for hyperlipidemia/ cholesterol was established and reinforced today.  The patient's status was assessed using clinical findings on exam, lab and other  diagnostic tests. The patient's disease status was assessed based on evidence-based guidelines and found to be good controlled. MEDICATIONS were reviewed. SELF MANAGEMENT GOALS have been discussed and patient's success at attaining the goal of low cholesterol was assessed. RECOMMENDATION given include regular exercise 3 days a week and low cholesterol/low fat diet. CLINICAL SUMMARY including written plan to identify barriers unique to the patient due to social or economic  reasons was discussed.     BMI 31.0-31.9,adult An individualize plan was formulated for obesity using patient history and physical exam to encourage weight loss.  An evidence based program was formulated.  Patient is to cut portion size with meals and to plan physical exercise 3 days a week at least 20 minutes.  Weight watchers and other programs are helpful.  Planned amount of weight loss 10 lbs.    30 minute visit with review of records   Follow-up: Return in about 4 months (around 12/15/2021) for fasting.    Reinaldo Meeker, MD

## 2021-08-18 LAB — CBC WITH DIFFERENTIAL/PLATELET
Basophils Absolute: 0 10*3/uL (ref 0.0–0.2)
Basos: 1 %
EOS (ABSOLUTE): 0.2 10*3/uL (ref 0.0–0.4)
Eos: 4 %
Hematocrit: 40.7 % (ref 37.5–51.0)
Hemoglobin: 13.9 g/dL (ref 13.0–17.7)
Immature Grans (Abs): 0 10*3/uL (ref 0.0–0.1)
Immature Granulocytes: 0 %
Lymphocytes Absolute: 2.3 10*3/uL (ref 0.7–3.1)
Lymphs: 55 %
MCH: 31 pg (ref 26.6–33.0)
MCHC: 34.2 g/dL (ref 31.5–35.7)
MCV: 91 fL (ref 79–97)
Monocytes Absolute: 0.4 10*3/uL (ref 0.1–0.9)
Monocytes: 10 %
Neutrophils Absolute: 1.2 10*3/uL — ABNORMAL LOW (ref 1.4–7.0)
Neutrophils: 30 %
Platelets: 177 10*3/uL (ref 150–450)
RBC: 4.48 x10E6/uL (ref 4.14–5.80)
RDW: 12.9 % (ref 11.6–15.4)
WBC: 4.1 10*3/uL (ref 3.4–10.8)

## 2021-08-18 LAB — LIPID PANEL
Chol/HDL Ratio: 3.1 ratio (ref 0.0–5.0)
Cholesterol, Total: 154 mg/dL (ref 100–199)
HDL: 49 mg/dL (ref >39)
LDL Chol Calc (NIH): 91 mg/dL (ref 0–99)
Triglycerides: 72 mg/dL (ref 0–149)
VLDL Cholesterol Cal: 14 mg/dL (ref 5–40)

## 2021-08-18 LAB — HEMOGLOBIN A1C
Est. average glucose Bld gHb Est-mCnc: 137 mg/dL
Hgb A1c MFr Bld: 6.4 % — ABNORMAL HIGH (ref 4.8–5.6)

## 2021-08-18 LAB — COMPREHENSIVE METABOLIC PANEL
ALT: 21 IU/L (ref 0–44)
AST: 22 IU/L (ref 0–40)
Albumin/Globulin Ratio: 2 (ref 1.2–2.2)
Albumin: 4.5 g/dL (ref 3.7–4.7)
Alkaline Phosphatase: 60 IU/L (ref 44–121)
BUN/Creatinine Ratio: 12 (ref 10–24)
BUN: 14 mg/dL (ref 8–27)
Bilirubin Total: 0.4 mg/dL (ref 0.0–1.2)
CO2: 22 mmol/L (ref 20–29)
Calcium: 9.3 mg/dL (ref 8.6–10.2)
Chloride: 108 mmol/L — ABNORMAL HIGH (ref 96–106)
Creatinine, Ser: 1.21 mg/dL (ref 0.76–1.27)
Globulin, Total: 2.2 g/dL (ref 1.5–4.5)
Glucose: 117 mg/dL — ABNORMAL HIGH (ref 70–99)
Potassium: 4.3 mmol/L (ref 3.5–5.2)
Sodium: 143 mmol/L (ref 134–144)
Total Protein: 6.7 g/dL (ref 6.0–8.5)
eGFR: 62 mL/min/{1.73_m2} (ref >59)

## 2021-08-18 LAB — CARDIOVASCULAR RISK ASSESSMENT

## 2021-08-18 NOTE — Progress Notes (Signed)
Cbc normal, glucose 117, kidney tests normal, liver tests normal, A1c 6.4, cholesterol normal,  lp

## 2021-09-01 ENCOUNTER — Telehealth: Payer: Medicare HMO | Admitting: *Deleted

## 2021-09-02 ENCOUNTER — Telehealth: Payer: Self-pay | Admitting: *Deleted

## 2021-09-02 ENCOUNTER — Telehealth: Payer: Medicare HMO | Admitting: *Deleted

## 2021-09-02 NOTE — Telephone Encounter (Signed)
°  Care Management   Follow Up Note   09/02/2021 Name: Mark [name] MRN: [id number] DOB: [birth year]   Referred by: Lillard Anes, MD  Reason for referral : Chronic Care Management Needs in Patient with Type II Diabetes Mellitus with Vascular Disease, Hypertension, Acute Renal Failure Superimposed on Stage III Chronic Kidney Disease, Homeless.  An unsuccessful telephone outreach was attempted today. The patient was referred to the case management team for assistance with care management and care coordination. CSW wanted to outreach to patient to discuss clinical social work follow-up plans, as well as to explain that he will be receiving a scheduled call from the newly assigned CSW.  HIPAA compliant messages were left on voicemail, as CSW continues to await a return call.  Patient will need assistance enrolling in MyChart, so he can see newly assigned CSW, Eduard Clos, in his appointment desk.  Follow-Up Plan:  Request placed with Scheduling Care Guide to reschedule patient's initial telephone outreach call with Eduard Clos, CSW.  Nat Christen LCSW Licensed Clinical Social Worker [facility] [phone]

## 2021-09-02 NOTE — Telephone Encounter (Signed)
°  Care Management   Follow Up Note   09/01/2021 Name: Mark [name] MRN: [id number] DOB: 03/20/1946   Referred by: Lillard Anes, MD  Reason for referral : Chronic Care Management Needs in Patient with Type II Diabetes Mellitus with Vascular Disease, Hypertension, Acute Renal Failure Superimposed on Stage III Chronic Kidney Disease, Homeless.   An unsuccessful telephone outreach was attempted today. The patient was referred to the case management team for assistance with care management and care coordination. A HIPAA compliant message was left on voicemail, providing contact information, encouraging patient to return CSW's call at his earliest convenience.  CSW will make a second initial telephone outreach call attempt on 09/02/2021 at 9:00 am, if a return call is not received from patient in the meantime.   Follow-Up Plan:  09/02/2021 at 9:00 am  [place] Clinical Social Worker [place] [phone]

## 2021-09-15 ENCOUNTER — Telehealth: Payer: Self-pay | Admitting: *Deleted

## 2021-09-15 NOTE — Chronic Care Management (AMB) (Signed)
°  Care Management   Note  09/15/2021 Name: Wyland Rastetter Stankus MRN: [id number] DOB: [birth year]  [name] Garrette is a 77 y.o. year old male who is a primary care patient of Abigail Miyamoto, MD and is actively engaged with the care management team. I reached out to [name] Grinage by phone today to assist with scheduling an initial visit with the Licensed Clinical Social Worker  Follow up plan: Patient declines engagement by the Licensed Clinical Social worker with the care management team. Appropriate care team members and provider have been notified via electronic communication. The patient has been provided with contact information for the care management team and has been advised to call with any health related questions or concerns.   Gwenevere Ghazi  Care Guide, Embedded Care Coordination [facility] Management  Direct Dial: [phone]

## 2021-11-01 ENCOUNTER — Telehealth: Payer: Medicare HMO

## 2021-11-03 ENCOUNTER — Other Ambulatory Visit: Payer: Self-pay

## 2021-11-03 MED ORDER — TRUE METRIX AIR GLUCOSE METER W/DEVICE KIT: 1.0000 | PACK | Freq: Two times a day (BID) | 0 refills | Status: AC

## 2021-11-03 MED ORDER — TRUE METRIX BLOOD GLUCOSE TEST VI STRP: 1.0000 | ORAL_STRIP | Freq: Two times a day (BID) | 2 refills | Status: DC

## 2021-11-03 MED ORDER — TRUEPLUS LANCETS 30G MISC: 1.0000 | Freq: Two times a day (BID) | 2 refills | Status: DC

## 2021-11-07 ENCOUNTER — Other Ambulatory Visit: Payer: Self-pay

## 2021-11-07 MED ORDER — TRUE METRIX LEVEL 1 LOW VI SOLN: 1.0000 | 2 refills | Status: AC

## 2021-11-07 MED ORDER — ALCOHOL SWABS 70 % PADS: 1.0000 | MEDICATED_PAD | Freq: Two times a day (BID) | 2 refills | Status: DC

## 2021-11-15 ENCOUNTER — Telehealth: Payer: Self-pay

## 2021-11-15 ENCOUNTER — Telehealth: Payer: Medicare HMO

## 2021-11-15 NOTE — Telephone Encounter (Signed)
? ?  RN Case Manager ?Care Management  ? Phone Outreach  ? ? ?11/15/2021 ?Name: Mark [name] MRN: [id number] DOB: 12-Feb-1946 ? ?Mark [name] is a 76 y.o. year old male who is a primary care patient of Abigail Miyamoto, MD .  ? ?Telephone outreach was unsuccessful A HIPPA compliant phone message was left for the patient providing contact information and requesting a return call.  ? ?Follow Up Plan: Will route chart to Care Guide to see if patient would like to reschedule phone appointment.   ? ?Review of patient status, including review of consultants reports, relevant laboratory and other test results, and collaboration with appropriate care team members and the patient's provider was performed as part of comprehensive patient evaluation and provision of care management services.   ? ?Juanell Fairly RN, BSN, [facility] ?Care Management Coordinator ?CM Registered Nurse Coverage for [name] RN ?Phone: [phone]  ?  ? ? ? ? ? ? ? ? ? ? ? ?

## 2021-12-26 ENCOUNTER — Ambulatory Visit (INDEPENDENT_AMBULATORY_CARE_PROVIDER_SITE_OTHER): Payer: Medicare HMO

## 2021-12-26 DIAGNOSIS — E1159 Type 2 diabetes mellitus with other circulatory complications: Secondary | ICD-10-CM

## 2021-12-26 DIAGNOSIS — N178 Other acute kidney failure: Secondary | ICD-10-CM

## 2021-12-26 DIAGNOSIS — I10 Essential (primary) hypertension: Secondary | ICD-10-CM

## 2021-12-26 NOTE — Progress Notes (Signed)
? ?Subjective:  ?Patient ID: Mark [name], male    DOB: [birth year]  Age: 76 y.o. MRN: [id number] ? ?Chief Complaint  ?Patient presents with  ? Diabetes  ? Hyperlipidemia  ? Hypertension  ? ? ?HPI: Chronc visit ?Patient present with type 2 diabetes. Compliance with treatment has been good; patient take medicines as directed, maintains diet and exercise regimen, follows up as directed, and is keeping glucose diary. Current medicines for diabetes Metformin 500 mg once a day. Patient performs foot exams daily and last ophthalmologic exam was 03/31/2020. Last A1C 6.4%. ? ?CORONARY ARTERY DISEASE  ?Patient presents in follow up of CAD. Patient was diagnosed in 2016. MI 2010 Saw r. Bailey The patient has no associated CHF. The patient is currently taking a beta blocker, statin, and aspirin. CAD was diagnosed 13 years ago.  Patient is having no angina. Patient has used no NTG.  Patient is followed by cardiology.  Patient had none . Last angiography was none, last echocardiogram none ? ?Patient presents with hyperlipidemia.  Compliance with treatment has been good; patient takes medicines as directed, maintains low cholesterol diet, follows up as directed, and maintains exercise regimen.  Patient is using atorvastatin without problems. .  ? ?Current Outpatient Medications on File Prior to Visit  ?Medication Sig Dispense Refill  ? albuterol (VENTOLIN HFA) 108 (90 Base) MCG/ACT inhaler     ? Alcohol Swabs 70 % PADS 1 each by Does not apply route 2 (two) times daily. 100 each 2  ? aspirin 81 MG chewable tablet Chew by mouth daily.    ? atorvastatin (LIPITOR) 20 MG tablet TAKE 1 TABLET EVERY DAY 90 tablet 2  ? Blood Glucose Calibration (TRUE METRIX LEVEL 1) Low SOLN 1 each by In Vitro route every 30 (thirty) days. 1 each 2  ? Blood Glucose Monitoring Suppl (TRUE METRIX AIR GLUCOSE METER) w/Device KIT 1 each by Does not apply route in the morning and at bedtime. 1 kit 0  ? carvedilol (COREG) 6.25 MG tablet TAKE 1  TABLET EVERY DAY 90 tablet 2  ? cyclobenzaprine (FLEXERIL) 5 MG tablet Take 1 tablet (5 mg total) by mouth at bedtime. 30 tablet 0  ? furosemide (LASIX) 20 MG tablet Take 1 tablet (20 mg total) by mouth every other day. 45 tablet 1  ? glucose blood (TRUE METRIX BLOOD GLUCOSE TEST) test strip 1 each by Other route in the morning and at bedtime. Use as instructed 100 each 2  ? metFORMIN (GLUCOPHAGE) 500 MG tablet TAKE 1/2 TABLET TWICE DAILY 90 tablet 2  ? nitroGLYCERIN (NITROSTAT) 0.4 MG SL tablet DISSOLVE ONE TABLET UNDER TONGUE AS NEEDED FOR CHEST PAIN UP TO 3 TABLETS 25 tablet 5  ? spironolactone (ALDACTONE) 25 MG tablet TAKE 1/2 TABLET EVERY DAY 45 tablet 2  ? TRUEplus Lancets 30G MISC 1 each by Does not apply route in the morning and at bedtime. 100 each 2  ? ?No current facility-administered medications on file prior to visit.  ? ?Past Medical History:  ?Diagnosis Date  ? Arthritis   ? Chronic diastolic heart failure ([place])   ? DVT (deep venous thrombosis) ([place])   ? Essential hypertension   ? History of BPH   ? Peripheral vascular disease ([place])   ? ?Past Surgical History:  ?Procedure Laterality Date  ? HERNIA REPAIR    ? INGUINAL HERNIA REPAIR Bilateral   ?  ?History reviewed. No pertinent family history. ?Social History  ? ?Socioeconomic History  ? Marital status:  Single  ?  Spouse name: Not on file  ? Number of children: 4  ? Years of education: 14  ? Highest education level: 12th grade  ?Occupational History  ? Occupation: retired   ?Tobacco Use  ? Smoking status: Never  ? Smokeless tobacco: Never  ?Vaping Use  ? Vaping Use: Never used  ?Substance and Sexual Activity  ? Alcohol use: No  ? Drug use: No  ? Sexual activity: Not Currently  ?Other Topics Concern  ? Not on file  ?Social History Narrative  ? Lives in a hotel and feels safe with his living arrangements.   ? ?Social Determinants of Health  ? ?Financial Resource Strain: Low Risk   ? Difficulty of Paying Living Expenses: Not very hard  ?Food Insecurity:  No Food Insecurity  ? Worried About Charity fundraiser in the Last Year: Never true  ? Ran Out of Food in the Last Year: Never true  ?Transportation Needs: No Transportation Needs  ? Lack of Transportation (Medical): No  ? Lack of Transportation (Non-Medical): No  ?Physical Activity: Sufficiently Active  ? Days of Exercise per Week: 7 days  ? Minutes of Exercise per Session: 60 min  ?Stress: No Stress Concern Present  ? Feeling of Stress : Not at all  ?Social Connections: Socially Isolated  ? Frequency of Communication with Friends and Family: More than three times a week  ? Frequency of Social Gatherings with Friends and Family: Never  ? Attends Religious Services: Never  ? Active Member of Clubs or Organizations: No  ? Attends Archivist Meetings: Never  ? Marital Status: Never married  ? ? ?Review of Systems  ?Constitutional:  Negative for chills, fatigue, fever and unexpected weight change.  ?HENT:  Negative for congestion, rhinorrhea, sinus pressure, sneezing and sore throat.   ?Eyes:  Negative for discharge and visual disturbance.  ?Respiratory:  Negative for cough, shortness of breath and wheezing.   ?Cardiovascular:  Negative for chest pain and palpitations.  ?Gastrointestinal:  Negative for abdominal pain, diarrhea, nausea and vomiting.  ?Endocrine: Negative for polydipsia, polyphagia and polyuria.  ?Genitourinary:  Negative for decreased urine volume, difficulty urinating, dysuria, frequency, penile swelling and urgency.  ?Musculoskeletal:  Negative for back pain, gait problem, joint swelling, neck pain and neck stiffness.  ?Neurological:  Negative for dizziness, seizures, weakness, numbness and headaches.  ?Psychiatric/Behavioral:  Negative for confusion, hallucinations, sleep disturbance and suicidal ideas. The patient is not nervous/anxious and is not hyperactive.   ? ? ?Objective:  ?BP 130/68   Pulse 64   Temp (!) 97.4 ?F (36.3 ?C)   Ht _0  (1.702 m)   Wt 210 lb 3.2 oz (95.3 kg)    SpO2 95%   BMI 32.92 kg/m?  ? ? ?  12/27/2021  ?  7:44 AM 08/17/2021  ?  7:55 AM 07/21/2021  ?  7:46 AM  ?BP/Weight  ?Systolic BP 409 811 914  ?Diastolic BP 68 76 80  ?Wt. (Lbs) 210.2 204 207  ?BMI 32.92 kg/m2 31.95 kg/m2 32.42 kg/m2  ? ? ?Physical Exam ?Vitals reviewed.  ?Constitutional:   ?   General: He is not in acute distress. ?   Appearance: Normal appearance. He is obese.  ?HENT:  ?   Head: Normocephalic.  ?   Right Ear: Tympanic membrane normal.  ?   Left Ear: Tympanic membrane normal.  ?   Nose: Nose normal.  ?   Mouth/Throat:  ?   Mouth: Mucous membranes are moist.  ?  Pharynx: Oropharynx is clear.  ?Eyes:  ?   Conjunctiva/sclera: Conjunctivae normal.  ?   Pupils: Pupils are equal, round, and reactive to light.  ?Cardiovascular:  ?   Rate and Rhythm: Normal rate and regular rhythm.  ?   Pulses: Normal pulses.  ?   Heart sounds: Normal heart sounds. No murmur heard. ?  No gallop.  ?Pulmonary:  ?   Effort: Pulmonary effort is normal. No respiratory distress.  ?   Breath sounds: Normal breath sounds. No wheezing.  ?Abdominal:  ?   General: Abdomen is flat. Bowel sounds are normal. There is no distension.  ?   Palpations: Abdomen is soft.  ?   Tenderness: There is no abdominal tenderness.  ?Musculoskeletal:     ?   General: Tenderness (right knee) present.  ?   Cervical back: Normal range of motion.  ?   Right lower leg: No edema.  ?   Left lower leg: No edema.  ?Skin: ?   General: Skin is warm.  ?   Capillary Refill: Capillary refill takes less than 2 seconds.  ?Neurological:  ?   General: No focal deficit present.  ?   Mental Status: He is alert and oriented to [place], place, and time. Mental status is at baseline.  ?   Gait: Gait normal.  ?   Deep Tendon Reflexes: Reflexes abnormal.  ?Psychiatric:     ?   Mood and Affect: Mood normal.  ? ? ?Diabetic Foot Exam - Simple   ?Simple Foot Form ?Diabetic Foot exam was performed with the following findings: Yes 12/27/2021  8:32 AM  ?Visual Inspection ?No deformities,  no ulcerations, no other skin breakdown bilaterally: Yes ?Sensation Testing ?Intact to touch and monofilament testing bilaterally: Yes ?Pulse Check ?Posterior Tibialis and Dorsalis pulse intact bilaterally: Yes

## 2021-12-26 NOTE — Patient Instructions (Signed)
Visit Information ? ?Thank you for taking time to visit with me today. Please don't hesitate to contact me if I can be of assistance to you before our next scheduled telephone appointment. ? ?Following are the goals we discussed today:  ?Take medications as prescribed   ?Attend all scheduled provider appointments ?Call pharmacy for medication refills 3-7 days in advance of running out of medications ?Call provider office for new concerns or questions  ?wash and dry feet carefully every day ?check blood pressure weekly ?choose a place to take my blood pressure (home, clinic or office, retail store) ?write blood pressure results in a log or diary ?keep a blood pressure log ?take blood pressure log to all doctor appointments ?call doctor for signs and symptoms of high blood pressure ?keep all doctor appointments ?take medications for blood pressure exactly as prescribed ? ?Our next appointment is by telephone on 03/07/22 at 2:30 pm ? ?Please call the care guide team at [phone] if you need to cancel or reschedule your appointment.  ? ?If you are experiencing a Mental Health or Behavioral Health Crisis or need someone to talk to, please call the Suicide and Crisis Lifeline: 988 ?call 1-800-273-TALK (toll free, 24 hour hotline)  ? ?The patient verbalized understanding of instructions, educational materials, and care plan provided today and agreed to receive a mailed copy of patient instructions, educational materials, and care plan.  ? ?George Ina RN,BSN,CCM (covering for Aquilla Solian, RN) ?RN Case Manager ?[phone] ? ?

## 2021-12-26 NOTE — Chronic Care Management (AMB) (Signed)
?Chronic Care Management  ? ?CCM RN Visit Note ? ?12/26/2021 ?Name: Mark [name] MRN: [id number] DOB: 1946/02/16 ? ?Subjective: ?Mark [name] is a 76 y.o. year old male who is a primary care patient of Mark Anes, MD. The care management team was consulted for assistance with disease management and care coordination needs.   ? ?Engaged with patient by telephone for follow up visit in response to provider referral for case management and/or care coordination services.  ? ?Consent to Services:  ?The patient was given information about Chronic Care Management services, agreed to services, and gave verbal consent prior to initiation of services.  Please see initial visit note for detailed documentation.  ? ?Patient agreed to services and verbal consent obtained.  ? ?Assessment: Review of patient past medical history, allergies, medications, health status, including review of consultants reports, laboratory and other test data, was performed as part of comprehensive evaluation and provision of chronic care management services.  ? ?SDOH (Social Determinants of Health) assessments and interventions performed:   ? ?CCM Care Plan ? ?Allergies  ?Allergen Reactions  ? Penicillins Rash  ?  Can take by mouth, can not take injections will break out in a rash  ? ? ?Outpatient Encounter Medications as of 12/26/2021  ?Medication Sig  ? albuterol (VENTOLIN HFA) 108 (90 Base) MCG/ACT inhaler   ? Alcohol Swabs 70 % PADS 1 each by Does not apply route 2 (two) times daily.  ? aspirin 81 MG chewable tablet Chew by mouth daily.  ? atorvastatin (LIPITOR) 20 MG tablet TAKE 1 TABLET EVERY DAY  ? Blood Glucose Calibration (TRUE METRIX LEVEL 1) Low SOLN 1 each by In Vitro route every 30 (thirty) days.  ? Blood Glucose Monitoring Suppl (TRUE METRIX AIR GLUCOSE METER) w/Device KIT 1 each by Does not apply route in the morning and at bedtime.  ? carvedilol (COREG) 6.25 MG tablet TAKE 1 TABLET EVERY DAY  ? cyclobenzaprine  (FLEXERIL) 5 MG tablet Take 1 tablet (5 mg total) by mouth at bedtime.  ? diclofenac Sodium (VOLTAREN) 1 % GEL APPLY 2 GRAMS EXTERNALLY TO THE AFFECTED AREA FOUR TIMES DAILY  ? furosemide (LASIX) 20 MG tablet Take 1 tablet (20 mg total) by mouth every other day.  ? glucose blood (TRUE METRIX BLOOD GLUCOSE TEST) test strip 1 each by Other route in the morning and at bedtime. Use as instructed  ? metFORMIN (GLUCOPHAGE) 500 MG tablet TAKE 1/2 TABLET TWICE DAILY  ? nitroGLYCERIN (NITROSTAT) 0.4 MG SL tablet DISSOLVE ONE TABLET UNDER TONGUE AS NEEDED FOR CHEST PAIN UP TO 3 TABLETS  ? spironolactone (ALDACTONE) 25 MG tablet TAKE 1/2 TABLET EVERY DAY  ? TRUEplus Lancets 30G MISC 1 each by Does not apply route in the morning and at bedtime.  ? ?No facility-administered encounter medications on file as of 12/26/2021.  ? ? ?Patient Active Problem List  ? Diagnosis Date Noted  ? Acute renal failure superimposed on stage 3a chronic kidney disease ([place]) 12/08/2020  ? Osteoarthritis of right knee 12/08/2020  ? Routine general medical examination at a health care facility 07/20/2020  ? Acute bronchitis with COPD ([place]) 03/29/2020  ? BMI 31.0-31.9,adult 03/29/2020  ? Diabetic glomerulopathy ([place]) 11/27/2019  ? Needs flu shot 11/27/2019  ? ED (erectile dysfunction) 11/27/2019  ? Type 2 diabetes mellitus with vascular disease ([place])   ? Essential hypertension 07/20/2017  ? Atherosclerotic heart disease of native coronary artery with angina pectoris ([place]) 07/20/2017  ? Chronic diastolic heart failure ([place])  07/20/2017  ? Peripheral vascular disease ([place]) 07/20/2017  ? DVT (deep venous thrombosis) ([place]) 07/20/2017  ? History of BPH 07/20/2017  ? Hyperlipidemia 07/20/2017  ? ? ?Conditions to be addressed/monitored:HTN and DMII ? ?Care Plan : RN care manager plan of care  ?Updates made by Mark Karvonen, RN since 12/26/2021 12:00 AM  ?  ? ?Problem: No plan of care established for management of chronic disease states ( DM, Hypertension)    ?Priority: High  ?  ? ?Long-Range Goal: Development of plan of care for chronic disease management ( DM, HTN)   ?Start Date: 08/02/2021  ?Expected End Date: 08/02/2022  ?Priority: High  ?Note:   ?Current Barriers:  ?Chronic Disease Management support and education needs related to HTN and DMII. ?08/02/2021 Patient reports he is doing well.  He states he continues to take his medications as prescribed.  He reports last appointment with primary care provider was 08/17/21.  Patient states he is scheduled with a follow up visit with primary care provider on tomorrow 12/27/21.  Patient states he is " good where he is" regarding his living situation.  RNCM informed patient that social worker had attempted to contact him to discuss assistance with his housing concerns.   Patient declined further outreach from Education officer, museum at this tme.    ? ?RNCM Clinical Goal(s):  ?Patient will take all medications exactly as prescribed and will call provider for medication related questions as evidenced by patient report    ?attend all scheduled medical appointments: PCP, care management as evidenced by patient report        ?demonstrate ongoing adherence to prescribed treatment plan for HTN and DMII as evidenced by patient report and labs  through collaboration with RN Care manager, provider, and care team.  ? ?Interventions: ?1:1 collaboration with primary care provider regarding development and update of comprehensive plan of care as evidenced by provider attestation and co-signature ?Inter-disciplinary care team collaboration (see longitudinal plan of care) ?Evaluation of current treatment plan related to  self management and patient's adherence to plan as established by provider ? ? ?Diabetes:  (Status: New goal.) Long Term Goal  ? ?Lab Results  ?Component Value Date  ? HGBA1C 6.4 (H) 08/17/2021  ?Assessed patient's understanding of A1c goal: <6.5% ?Reviewed medications with patient and discussed importance of medication adherence;         ?Reviewed prescribed diet with patient reviewed importance of reducing sweets; ?Counseled on importance of regular laboratory monitoring as prescribed;        ?Discussed plans with patient for ongoing care management follow up and provided patient with direct contact information for care management team;      ?Assessed social determinant of health barriers;        ? ?Hypertension: (Status: Goal on Track (progressing): YES.) ?Last practice recorded BP readings:  ?BP Readings from Last 3 Encounters:  ?08/17/21 128/76  ?07/21/21 128/80  ?05/19/21 100/68  ?Most recent eGFR/CrCl:  ?Lab Results  ?Component Value Date  ? EGFR 62 08/17/2021  ?  No components found for: CRCL ? ?Evaluation of current treatment plan related to hypertension self management and patient's adherence to plan as established by provider;   ?Reviewed medications with patient and discussed importance of compliance;  ?Encouraged patient to monitor BP weekly and record. Encouraged patient to BP readings to MD appointments ?Encouraged patient to adhere to low salt diet.  ? ?Patient Goals/Self-Care Activities: ?Take medications as prescribed   ?Attend all scheduled provider appointments ?Call pharmacy for  medication refills 3-7 days in advance of running out of medications ?Call provider office for new concerns or questions  ?wash and dry feet carefully every day ?check blood pressure weekly ?choose a place to take my blood pressure (home, clinic or office, retail store) ?write blood pressure results in a log or diary ?keep a blood pressure log ?take blood pressure log to all doctor appointments ?call doctor for signs and symptoms of high blood pressure ?keep all doctor appointments ?take medications for blood pressure exactly as prescribed ?Continue to follow a low salt, diabetic diet.  ? ? ?  ? ? ?Plan:The patient has been provided with contact information for the care management team and has been advised to call with any health related questions or  concerns.  ?The care management team will reach out to the patient again over the next 2 months. ? ?Mark Plowman RN,BSN,CCM (covering for Mark Levering, RN) ?RN Case Manager ?[phone] ? ? ? ? ? ? ? ? ? ?

## 2021-12-27 ENCOUNTER — Telehealth: Payer: Medicare HMO

## 2021-12-27 ENCOUNTER — Encounter: Payer: Self-pay | Admitting: Legal Medicine

## 2021-12-27 ENCOUNTER — Ambulatory Visit (INDEPENDENT_AMBULATORY_CARE_PROVIDER_SITE_OTHER): Payer: Medicare HMO | Admitting: Legal Medicine

## 2021-12-27 VITALS — BP 130/68 | HR 64 | Temp 97.4°F | Ht 67.0 in | Wt 210.2 lb

## 2021-12-27 DIAGNOSIS — E7849 Other hyperlipidemia: Secondary | ICD-10-CM

## 2021-12-27 DIAGNOSIS — I447 Left bundle-branch block, unspecified: Secondary | ICD-10-CM

## 2021-12-27 DIAGNOSIS — N400 Enlarged prostate without lower urinary tract symptoms: Secondary | ICD-10-CM

## 2021-12-27 DIAGNOSIS — I739 Peripheral vascular disease, unspecified: Secondary | ICD-10-CM

## 2021-12-27 DIAGNOSIS — E1121 Type 2 diabetes mellitus with diabetic nephropathy: Secondary | ICD-10-CM | POA: Diagnosis not present

## 2021-12-27 DIAGNOSIS — E1159 Type 2 diabetes mellitus with other circulatory complications: Secondary | ICD-10-CM

## 2021-12-27 DIAGNOSIS — I25119 Atherosclerotic heart disease of native coronary artery with unspecified angina pectoris: Secondary | ICD-10-CM | POA: Diagnosis not present

## 2021-12-27 DIAGNOSIS — I5032 Chronic diastolic (congestive) heart failure: Secondary | ICD-10-CM

## 2021-12-27 DIAGNOSIS — Z6831 Body mass index (BMI) 31.0-31.9, adult: Secondary | ICD-10-CM | POA: Diagnosis not present

## 2021-12-27 DIAGNOSIS — I10 Essential (primary) hypertension: Secondary | ICD-10-CM | POA: Diagnosis not present

## 2021-12-27 HISTORY — DX: Left bundle-branch block, unspecified: I44.7

## 2021-12-28 LAB — COMPREHENSIVE METABOLIC PANEL
ALT: 17 IU/L (ref 0–44)
AST: 18 IU/L (ref 0–40)
Albumin/Globulin Ratio: 1.6 (ref 1.2–2.2)
Albumin: 4.3 g/dL (ref 3.7–4.7)
Alkaline Phosphatase: 67 IU/L (ref 44–121)
BUN/Creatinine Ratio: 13 (ref 10–24)
BUN: 15 mg/dL (ref 8–27)
Bilirubin Total: 0.4 mg/dL (ref 0.0–1.2)
CO2: 22 mmol/L (ref 20–29)
Calcium: 9.8 mg/dL (ref 8.6–10.2)
Chloride: 110 mmol/L — ABNORMAL HIGH (ref 96–106)
Creatinine, Ser: 1.2 mg/dL (ref 0.76–1.27)
Globulin, Total: 2.7 g/dL (ref 1.5–4.5)
Glucose: 114 mg/dL — ABNORMAL HIGH (ref 70–99)
Potassium: 4.9 mmol/L (ref 3.5–5.2)
Sodium: 146 mmol/L — ABNORMAL HIGH (ref 134–144)
Total Protein: 7 g/dL (ref 6.0–8.5)
eGFR: 63 mL/min/{1.73_m2} (ref >59)

## 2021-12-28 LAB — CBC WITH DIFFERENTIAL/PLATELET
Basophils Absolute: 0 10*3/uL (ref 0.0–0.2)
Basos: 1 %
EOS (ABSOLUTE): 0.1 10*3/uL (ref 0.0–0.4)
Eos: 3 %
Hematocrit: 40.9 % (ref 37.5–51.0)
Hemoglobin: 14 g/dL (ref 13.0–17.7)
Immature Grans (Abs): 0 10*3/uL (ref 0.0–0.1)
Immature Granulocytes: 0 %
Lymphocytes Absolute: 2.1 10*3/uL (ref 0.7–3.1)
Lymphs: 52 %
MCH: 31.7 pg (ref 26.6–33.0)
MCHC: 34.2 g/dL (ref 31.5–35.7)
MCV: 93 fL (ref 79–97)
Monocytes Absolute: 0.4 10*3/uL (ref 0.1–0.9)
Monocytes: 10 %
Neutrophils Absolute: 1.4 10*3/uL (ref 1.4–7.0)
Neutrophils: 34 %
Platelets: 238 10*3/uL (ref 150–450)
RBC: 4.42 x10E6/uL (ref 4.14–5.80)
RDW: 13.8 % (ref 11.6–15.4)
WBC: 4.1 10*3/uL (ref 3.4–10.8)

## 2021-12-28 LAB — LIPID PANEL
Chol/HDL Ratio: 3.3 ratio (ref 0.0–5.0)
Cholesterol, Total: 145 mg/dL (ref 100–199)
HDL: 44 mg/dL (ref >39)
LDL Chol Calc (NIH): 86 mg/dL (ref 0–99)
Triglycerides: 75 mg/dL (ref 0–149)
VLDL Cholesterol Cal: 15 mg/dL (ref 5–40)

## 2021-12-28 LAB — HEMOGLOBIN A1C
Est. average glucose Bld gHb Est-mCnc: 146 mg/dL
Hgb A1c MFr Bld: 6.7 % — ABNORMAL HIGH (ref 4.8–5.6)

## 2021-12-28 LAB — PSA: Prostate Specific Ag, Serum: 0.1 ng/mL (ref 0.0–4.0)

## 2021-12-28 LAB — MICROALBUMIN / CREATININE URINE RATIO
Creatinine, Urine: 250.3 mg/dL
Microalb/Creat Ratio: 4 mg/g creat (ref 0–29)
Microalbumin, Urine: 8.9 ug/mL

## 2021-12-28 LAB — CARDIOVASCULAR RISK ASSESSMENT

## 2021-12-28 NOTE — Progress Notes (Signed)
Glucose 114, kidney tests normal, liver tests normal, A1c 6.7, PSA 0.1 normal, Cholesterol normal, CBC normal, Microalbumin normal ?lp

## 2021-12-28 NOTE — Addendum Note (Signed)
Addended by: Thompson Caul I on: 12/28/2021 05:27 PM ? ? Modules accepted: Orders ? ?

## 2022-01-05 ENCOUNTER — Telehealth: Payer: Medicare HMO

## 2022-01-11 ENCOUNTER — Ambulatory Visit (INDEPENDENT_AMBULATORY_CARE_PROVIDER_SITE_OTHER): Payer: Medicare HMO | Admitting: Legal Medicine

## 2022-01-11 ENCOUNTER — Encounter: Payer: Self-pay | Admitting: Legal Medicine

## 2022-01-11 VITALS — BP 110/64 | HR 69 | Temp 98.2°F | Resp 15 | Ht 67.0 in | Wt 210.0 lb

## 2022-01-11 DIAGNOSIS — M542 Cervicalgia: Secondary | ICD-10-CM | POA: Diagnosis not present

## 2022-01-11 DIAGNOSIS — Z6832 Body mass index (BMI) 32.0-32.9, adult: Secondary | ICD-10-CM | POA: Diagnosis not present

## 2022-01-11 DIAGNOSIS — E1159 Type 2 diabetes mellitus with other circulatory complications: Secondary | ICD-10-CM | POA: Diagnosis not present

## 2022-01-11 DIAGNOSIS — I10 Essential (primary) hypertension: Secondary | ICD-10-CM

## 2022-01-11 DIAGNOSIS — Z7984 Long term (current) use of oral hypoglycemic drugs: Secondary | ICD-10-CM | POA: Diagnosis not present

## 2022-01-11 DIAGNOSIS — M47812 Spondylosis without myelopathy or radiculopathy, cervical region: Secondary | ICD-10-CM

## 2022-01-11 HISTORY — DX: Spondylosis without myelopathy or radiculopathy, cervical region: M47.812

## 2022-01-11 NOTE — Progress Notes (Unsigned)
Acute Office Visit  Subjective:    Patient ID: Mark [name], male    DOB: Oct 16, 1945, 76 y.o.   MRN: [id number]  Chief Complaint  Patient presents with   Neck Pain    PJK:[id number] Patient is in today for aching pain on left side of his neck since 2 days ago. He denied any injuries, fever, chills. The  pain does not radiate to anywhere. Pain turning to left.  No hand weakness.  Past Medical History:  Diagnosis Date   Arthritis    Chronic diastolic heart failure ([place])    DVT (deep venous thrombosis) ([facility])    Essential hypertension    History of BPH    Peripheral vascular disease ([place])     Past Surgical History:  Procedure Laterality Date   HERNIA REPAIR     INGUINAL HERNIA REPAIR Bilateral     History reviewed. No pertinent family history.  Social History   Socioeconomic History   Marital status: Single    Spouse name: Not on file   Number of children: 4   Years of education: 12   Highest education level: 12th grade  Occupational History   Occupation: retired   Tobacco Use   Smoking status: Never   Smokeless tobacco: Never  Vaping Use   Vaping Use: Never used  Substance and Sexual Activity   Alcohol use: No   Drug use: No   Sexual activity: Not Currently  Other Topics Concern   Not on file  Social History Narrative   Lives in a hotel and feels safe with his living arrangements.    Social Determinants of Health   Financial Resource Strain: Low Risk    Difficulty of Paying Living Expenses: Not very hard  Food Insecurity: No Food Insecurity   Worried About Charity fundraiser in the Last Year: Never true   Ran Out of Food in the Last Year: Never true  Transportation Needs: No Transportation Needs   Lack of Transportation (Medical): No   Lack of Transportation (Non-Medical): No  Physical Activity: Sufficiently Active   Days of Exercise per Week: 7 days   Minutes of Exercise per Session: 60 min  Stress: No Stress Concern Present   Feeling of Stress :  Not at all  Social Connections: Socially Isolated   Frequency of Communication with Friends and Family: More than three times a week   Frequency of Social Gatherings with Friends and Family: Never   Attends Religious Services: Never   Marine scientist or Organizations: No   Attends Music therapist: Never   Marital Status: Never married  Human resources officer Violence: Not At Risk   Fear of Current or Ex-Partner: No   Emotionally Abused: No   Physically Abused: No   Sexually Abused: No    Outpatient Medications Prior to Visit  Medication Sig Dispense Refill   albuterol (VENTOLIN HFA) 108 (90 Base) MCG/ACT inhaler      Alcohol Swabs 70 % PADS 1 each by Does not apply route 2 (two) times daily. 100 each 2   aspirin 81 MG chewable tablet Chew by mouth daily.     atorvastatin (LIPITOR) 20 MG tablet TAKE 1 TABLET EVERY DAY 90 tablet 2   Blood Glucose Calibration (TRUE METRIX LEVEL 1) Low SOLN 1 each by In Vitro route every 30 (thirty) days. 1 each 2   Blood Glucose Monitoring Suppl (TRUE METRIX AIR GLUCOSE METER) w/Device KIT 1 each by Does not apply route in the  morning and at bedtime. 1 kit 0   carvedilol (COREG) 6.25 MG tablet TAKE 1 TABLET EVERY DAY 90 tablet 2   furosemide (LASIX) 20 MG tablet Take 1 tablet (20 mg total) by mouth every other day. 45 tablet 1   glucose blood (TRUE METRIX BLOOD GLUCOSE TEST) test strip 1 each by Other route in the morning and at bedtime. Use as instructed 100 each 2   metFORMIN (GLUCOPHAGE) 500 MG tablet TAKE 1/2 TABLET TWICE DAILY 90 tablet 2   nitroGLYCERIN (NITROSTAT) 0.4 MG SL tablet DISSOLVE ONE TABLET UNDER TONGUE AS NEEDED FOR CHEST PAIN UP TO 3 TABLETS 25 tablet 5   spironolactone (ALDACTONE) 25 MG tablet TAKE 1/2 TABLET EVERY DAY 45 tablet 2   TRUEplus Lancets 30G MISC 1 each by Does not apply route in the morning and at bedtime. 100 each 2   cyclobenzaprine (FLEXERIL) 5 MG tablet Take 1 tablet (5 mg total) by mouth at bedtime. 30  tablet 0   No facility-administered medications prior to visit.    Allergies  Allergen Reactions   Penicillins Rash    Can take by mouth, can not take injections will break out in a rash    Review of Systems  Constitutional:  Negative for chills, fatigue, fever and unexpected weight change.  HENT:  Negative for congestion, ear pain, sinus pain and sore throat.   Eyes:  Negative for visual disturbance.  Respiratory:  Negative for cough and shortness of breath.   Cardiovascular:  Negative for chest pain and palpitations.  Gastrointestinal:  Negative for abdominal pain, blood in stool, constipation, diarrhea, nausea and vomiting.  Endocrine: Negative for polydipsia.  Genitourinary:  Negative for dysuria.  Musculoskeletal:  Positive for neck pain (left sided). Negative for back pain.  Skin:  Negative for rash.  Neurological:  Negative for headaches.      Objective:    Physical Exam Vitals reviewed.  Constitutional:      Appearance: Normal appearance.  Neck:     Comments: Pain when turning neck left, flexion 20 degrees, extension 30 degrees, negative [place] Cardiovascular:     Rate and Rhythm: Normal rate and regular rhythm.     Pulses: Normal pulses.     Heart sounds: Normal heart sounds.  Pulmonary:     Effort: Pulmonary effort is normal.     Breath sounds: Normal breath sounds.  Musculoskeletal:     Cervical back: Tenderness present.     Comments: Strength and ROM normal in UE, DTRS 2+ and strength 5/5  Neurological:     Mental Status: He is alert.    BP 110/64   Pulse 69   Temp 98.2 F (36.8 C)   Resp 15   Ht $R'5\' 7"'xl$  (1.702 m)   Wt 210 lb (95.3 kg)   SpO2 97%   BMI 32.89 kg/m  Wt Readings from Last 3 Encounters:  01/13/22 210 lb (95.3 kg)  01/11/22 210 lb (95.3 kg)  12/27/21 210 lb 3.2 oz (95.3 kg)    Health Maintenance Due  Topic Date Due   TETANUS/TDAP  Never done   Zoster Vaccines- Shingrix (1 of 2) Never done   OPHTHALMOLOGY EXAM  03/31/2021    COVID-19 Vaccine (2 - Pfizer series) 06/09/2021    There are no preventive care reminders to display for this patient.   No results found for: TSH Lab Results  Component Value Date   WBC 4.1 12/27/2021   HGB 14.0 12/27/2021   HCT 40.9 12/27/2021  MCV 93 12/27/2021   PLT 238 12/27/2021   Lab Results  Component Value Date   NA 146 (H) 12/27/2021   K 4.9 12/27/2021   CO2 22 12/27/2021   GLUCOSE 114 (H) 12/27/2021   BUN 15 12/27/2021   CREATININE 1.20 12/27/2021   BILITOT 0.4 12/27/2021   ALKPHOS 67 12/27/2021   AST 18 12/27/2021   ALT 17 12/27/2021   PROT 7.0 12/27/2021   ALBUMIN 4.3 12/27/2021   CALCIUM 9.8 12/27/2021   EGFR 63 12/27/2021   Lab Results  Component Value Date   CHOL 145 12/27/2021   Lab Results  Component Value Date   HDL 44 12/27/2021   Lab Results  Component Value Date   LDLCALC 86 12/27/2021   Lab Results  Component Value Date   TRIG 75 12/27/2021   Lab Results  Component Value Date   CHOLHDL 3.3 12/27/2021   Lab Results  Component Value Date   HGBA1C 6.7 (H) 12/27/2021       Assessment & Plan:   Problem List Items Addressed This Visit       Musculoskeletal and Integument   Cervical spondylosis Patient has recurrent neck pain suspect spondylosis. Get x-ray neck, ROM exercises, Heat, use voltaren cream     Other   BMI 32.0-32.9,adult An individualize plan was formulated for obesity using patient history and physical exam to encourage weight loss.  An evidence based program was formulated.  Patient is to cut portion size with meals and to plan physical exercise 3 days a week at least 20 minutes.  Weight watchers and other programs are helpful.  Planned amount of weight loss 10 lbs.    Other Visit Diagnoses     Cervical pain    -  Primary   Relevant Orders   DG Cervical Spine Complete Get x-ray, muscular spasm with probably OA cervical spine.  Acute onset and should resolve with exercise , heat and NSAIDS        Orders  Placed This Encounter  Procedures   DG Cervical Spine Complete     Follow-up: Return if symptoms worsen or fail to improve.  An After Visit Summary was printed and given to the patient.  Reinaldo Meeker, MD [facility] [phone]

## 2022-01-11 NOTE — Patient Instructions (Signed)
Cervical Strain and Sprain Rehab Ask your health care provider which exercises are safe for you. Do exercises exactly as told by your health care provider and adjust them as directed. It is normal to feel mild stretching, pulling, tightness, or discomfort as you do these exercises. Stop right away if you feel sudden pain or your pain gets worse. Do not begin these exercises until told by your health care provider. Stretching and range-of-motion exercises Cervical side bending  Using good posture, sit on a stable chair or stand up. Without moving your shoulders, slowly tilt your left / right ear to your shoulder until you feel a stretch in the opposite side neck muscles. You should be looking straight ahead. Hold for __________ seconds. Repeat with the other side of your neck. Repeat __________ times. Complete this exercise __________ times a day. Cervical rotation  Using good posture, sit on a stable chair or stand up. Slowly turn your head to the side as if you are looking over your left / right shoulder. Keep your eyes level with the ground. Stop when you feel a stretch along the side and the back of your neck. Hold for __________ seconds. Repeat this by turning to your other side. Repeat __________ times. Complete this exercise __________ times a day. Thoracic extension and pectoral stretch  Roll a towel or a small blanket so it is about 4 inches (10 cm) in diameter. Lie down on your back on a firm surface. Put the towel in the middle of your back across your spine. It should not be under your shoulder blades. Put your hands behind your head and let your elbows fall out to your sides. Hold for __________ seconds. Repeat __________ times. Complete this exercise __________ times a day. Strengthening exercises Isometric upper cervical flexion  Lie on your back with a thin pillow behind your head and a small rolled-up towel under your neck. Gently tuck your chin toward your chest and  nod your head down to look toward your feet. Do not lift your head off the pillow. Hold for __________ seconds. Release the tension slowly. Relax your neck muscles completely before you repeat this exercise. Repeat __________ times. Complete this exercise __________ times a day. Isometric cervical extension  Stand about 6 inches (15 cm) away from a wall, with your back facing the wall. Place a soft object, about 6-8 inches (15-20 cm) in diameter, between the back of your head and the wall. A soft object could be a small pillow, a ball, or a folded towel. Gently tilt your head back and press into the soft object. Keep your jaw and forehead relaxed. Hold for __________ seconds. Release the tension slowly. Relax your neck muscles completely before you repeat this exercise. Repeat __________ times. Complete this exercise __________ times a day. Posture and body mechanics Body mechanics refers to the movements and positions of your body while you do your daily activities. Posture is part of body mechanics. Good posture and healthy body mechanics can help to relieve stress in your body's tissues and joints. Good posture means that your spine is in its natural S-curve position (your spine is neutral), your shoulders are pulled back slightly, and your head is not tipped forward. The following are general guidelines for applying improved posture and body mechanics to your everyday activities. Sitting  When sitting, keep your spine neutral and keep your feet flat on the floor. Use a footrest, if necessary, and keep your thighs parallel to the floor. Avoid rounding   your shoulders, and avoid tilting your head forward. When working at a desk or a computer, keep your desk at a height where your hands are slightly lower than your elbows. Slide your chair under your desk so you are close enough to maintain good posture. When working at a computer, place your monitor at a height where you are looking straight ahead  and you do not have to tilt your head forward or downward to look at the screen. Standing  When standing, keep your spine neutral and keep your feet about hip-width apart. Keep a slight bend in your knees. Your ears, shoulders, and hips should line up. When you do a task in which you stand in one place for a long time, place one foot up on a stable object that is 2-4 inches (5-10 cm) high, such as a footstool. This helps keep your spine neutral. Resting When lying down and resting, avoid positions that are most painful for you. Try to support your neck in a neutral position. You can use a contour pillow or a small rolled-up towel. Your pillow should support your neck but not push on it. This information is not intended to replace advice given to you by your health care provider. Make sure you discuss any questions you have with your health care provider. Document Revised: 06/20/2021 Document Reviewed: 06/20/2021 Elsevier Patient Education  2023 Elsevier Inc.  

## 2022-01-13 ENCOUNTER — Other Ambulatory Visit: Payer: Self-pay

## 2022-01-13 ENCOUNTER — Emergency Department (HOSPITAL_COMMUNITY): Payer: Medicare HMO

## 2022-01-13 ENCOUNTER — Encounter (HOSPITAL_COMMUNITY): Payer: Self-pay

## 2022-01-13 ENCOUNTER — Emergency Department (HOSPITAL_COMMUNITY)
Admission: EM | Admit: 2022-01-13 | Discharge: 2022-01-13 | Disposition: A | Discharge status: Home / Self Care | Payer: Medicare HMO | Attending: Emergency Medicine | Admitting: Emergency Medicine

## 2022-01-13 DIAGNOSIS — M545 Low back pain, unspecified: Secondary | ICD-10-CM

## 2022-01-13 DIAGNOSIS — Z79899 Other long term (current) drug therapy: Secondary | ICD-10-CM | POA: Insufficient documentation

## 2022-01-13 DIAGNOSIS — M62838 Other muscle spasm: Secondary | ICD-10-CM | POA: Diagnosis not present

## 2022-01-13 DIAGNOSIS — Z7982 Long term (current) use of aspirin: Secondary | ICD-10-CM | POA: Diagnosis not present

## 2022-01-13 DIAGNOSIS — Z7984 Long term (current) use of oral hypoglycemic drugs: Secondary | ICD-10-CM | POA: Insufficient documentation

## 2022-01-13 DIAGNOSIS — M542 Cervicalgia: Secondary | ICD-10-CM | POA: Diagnosis not present

## 2022-01-13 MED ORDER — CYCLOBENZAPRINE HCL 5 MG PO TABS: 5.0000 mg | ORAL_TABLET | Freq: Every day | ORAL | 0 refills | Status: DC

## 2022-01-13 NOTE — Discharge Instructions (Signed)
Please read and follow all provided instructions.  Your diagnoses today include:  1. Cervical paraspinal muscle spasm   2. Acute low back pain without sciatica, unspecified back pain laterality     Tests performed today include: Vital signs - see below for your results today X-ray of your neck: No broken bones seen, you do have arthritis in your neck  Medications prescribed:  Flexeril (cyclobenzaprine) - muscle relaxer medication  DO NOT drive or perform any activities that require you to be awake and alert because this medicine can make you drowsy.   Use this medication only under direct supervision at the lowest possible dose needed to control your pain. It can cause dizziness and increase the risk of falls.   Take any prescribed medications only as directed.  Home care instructions:  Follow any educational materials contained in this packet Continue to use warmth on the area and use the topical Voltaren as directed by your doctor Do not lift, push, pull anything more than 10 pounds for the next week  Follow-up instructions: Please follow-up with your primary care provider in the next 1 week for further evaluation of your symptoms.   Return instructions:  SEEK IMMEDIATE MEDICAL ATTENTION IF YOU HAVE: New numbness, tingling, weakness, or problem with the use of your arms or legs Severe back pain not relieved with medications Loss control of your bowels or bladder Increasing pain in any areas of the body (such as chest or abdominal pain) Shortness of breath, dizziness, or fainting.  Worsening nausea (feeling sick to your stomach), vomiting, fever, or sweats Any other emergent concerns regarding your health   Additional Information:  Your vital signs today were: BP 134/75   Pulse 71   Temp 98.1 F (36.7 C) (Oral)   Resp 16   Ht 5\' 7"  (1.702 m)   Wt 95.3 kg   SpO2 96%   BMI 32.89 kg/m  If your blood pressure (BP) was elevated above 135/85 this visit, please have this  repeated by your doctor within one month. --------------

## 2022-01-13 NOTE — ED Triage Notes (Signed)
Reports woke up a few days ago with a stiff neck and difficulty turning head.

## 2022-01-13 NOTE — ED Provider Notes (Signed)
[facility] Provider Note   CSN: [id number] Arrival date & time: 01/13/22  0920     History  Chief Complaint  Patient presents with   Torticollis    Mark [name] is a 76 y.o. male.  Patient presents to the emergency department today for evaluation of neck pain.  Patient states that he awoke with the pain about 2 days ago.  He denies preceding injuries.  Pain is worse with movement of the neck left and right and up and down.  He went and saw his primary care doctor 2 days ago.  He states that he was prescribed a book with some exercises as well as topical Voltaren.  He denies headache, vision change, weakness, numbness, or tingling in the arms or the legs.  No treatments otherwise prior to arrival.  Patient does take multiple daily medications.      Home Medications Prior to Admission medications   Medication Sig Start Date End Date Taking? Authorizing Provider  albuterol (VENTOLIN HFA) 108 (90 Base) MCG/ACT inhaler  06/23/19   [provider]  Alcohol Swabs 70 % PADS 1 each by Does not apply route 2 (two) times daily. 11/07/21   Lillard Anes, MD  aspirin 81 MG chewable tablet Chew by mouth daily.    [provider]  atorvastatin (LIPITOR) 20 MG tablet TAKE 1 TABLET EVERY DAY 06/19/21   Lillard Anes, MD  Blood Glucose Calibration (TRUE METRIX LEVEL 1) Low SOLN 1 each by In Vitro route every 30 (thirty) days. 11/07/21   Lillard Anes, MD  Blood Glucose Monitoring Suppl (TRUE METRIX AIR GLUCOSE METER) w/Device KIT 1 each by Does not apply route in the morning and at bedtime. 11/03/21   Lillard Anes, MD  carvedilol (COREG) 6.25 MG tablet TAKE 1 TABLET EVERY DAY 05/20/21   Lillard Anes, MD  cyclobenzaprine (FLEXERIL) 5 MG tablet Take 1 tablet (5 mg total) by mouth at bedtime. 05/19/21   Marge Duncans, PA-C  furosemide (LASIX) 20 MG tablet Take 1 tablet (20 mg total) by mouth every other  day. 06/17/21   [facility], Elnita Maxwell, MD  glucose blood (TRUE METRIX BLOOD GLUCOSE TEST) test strip 1 each by Other route in the morning and at bedtime. Use as instructed 11/03/21   Lillard Anes, MD  metFORMIN (GLUCOPHAGE) 500 MG tablet TAKE 1/2 TABLET TWICE DAILY 04/10/21   Lillard Anes, MD  nitroGLYCERIN (NITROSTAT) 0.4 MG SL tablet DISSOLVE ONE TABLET UNDER TONGUE AS NEEDED FOR CHEST PAIN UP TO 3 TABLETS 03/22/21   Lillard Anes, MD  spironolactone (ALDACTONE) 25 MG tablet TAKE 1/2 TABLET EVERY DAY 05/20/21   Lillard Anes, MD  TRUEplus Lancets 30G MISC 1 each by Does not apply route in the morning and at bedtime. 11/03/21   Lillard Anes, MD      Allergies    Penicillins    Review of Systems   Review of Systems  Physical Exam Updated Vital Signs BP 139/83 (BP Location: Right Arm)   Pulse 79   Temp 98.1 F (36.7 C) (Oral)   Resp 16   Ht _0  (1.702 m)   Wt 95.3 kg   SpO2 100%   BMI 32.89 kg/m   Physical Exam Vitals and nursing note reviewed.  Constitutional:      Appearance: He is well-developed.  HENT:     Head: Normocephalic and atraumatic.     Mouth/Throat:     Mouth:  Mucous membranes are moist.  Eyes:     Conjunctiva/sclera: Conjunctivae normal.  Cardiovascular:     Pulses: Normal pulses. No decreased pulses.  Musculoskeletal:        General: Tenderness present.     Right shoulder: No tenderness or bony tenderness. Normal range of motion.     Left shoulder: Tenderness present. No bony tenderness. Normal range of motion.     Cervical back: Neck supple. Spasms and tenderness present. No rigidity, torticollis or bony tenderness. Decreased range of motion.     Thoracic back: No tenderness.     Right lower leg: No edema.     Left lower leg: No edema.     Comments: Patient tender to palpation over the left trapezius muscle and left lateral neck.  Skin:    General: Skin is warm and dry.  Neurological:     Mental Status: He is alert.      Sensory: No sensory deficit.     Comments: 5 out of 5 upper and lower extremity strength.   Psychiatric:        Mood and Affect: Mood normal.    ED Results / Procedures / Treatments   Labs (all labs ordered are listed, but only abnormal results are displayed) Labs Reviewed - No data to display  EKG None  Radiology DG Cervical Spine Complete  Result Date: 01/13/2022 CLINICAL DATA:  Awoke with neck pain.  No specific trauma. EXAM: CERVICAL SPINE - COMPLETE 4+ VIEW COMPARISON:  None Available. FINDINGS: No fracture, bone lesion or spondylolisthesis. Focal kyphosis, apex at C5. Moderate loss of disc height at C4-C5, C5-C6 and C6-C7 with associated anterior endplate spurring. No significant neural foraminal narrowing. Bilateral facet degenerative changes most evident at L2-L3, 3-L4 and L4-L5. Soft tissues are unremarkable. IMPRESSION: 1. No fracture or acute finding. 2. Degenerative changes as detailed. Electronically Signed   By: Lajean Manes M.D.   On: 01/13/2022 10:45    Procedures Procedures    Medications Ordered in ED Medications - No data to display  ED Course/ Medical Decision Making/ A&P    Patient seen and examined. History obtained directly from patient.  Reviewed patient's primary care notes from 2 days ago.  Labs/EKG: None ordered.  Imaging: Ordered x-ray of the cervical spine.  Medications/Fluids: None ordered  Most recent vital signs reviewed and are as follows: BP 139/83 (BP Location: Right Arm)   Pulse 79   Temp 98.1 F (36.7 C) (Oral)   Resp 16   Ht _0  (1.702 m)   Wt 95.3 kg   SpO2 100%   BMI 32.89 kg/m   Initial impression: Neck pain, likely musculoskeletal, left trapezius spasm.  11:12 AM Reassessment performed. Patient appears stable.   Imaging personally visualized and interpreted including: X-ray of the cervical spine, agree no fracture. Chronic degenerative changes present.  Reviewed pertinent lab work and imaging with patient at  bedside. Questions answered.   Most current vital signs reviewed and are as follows: BP 134/75   Pulse 71   Temp 98.1 F (36.7 C) (Oral)   Resp 16   Ht _1  (1.702 m)   Wt 95.3 kg   SpO2 96%   BMI 32.89 kg/m   Plan: Discharge to home.   Prescriptions written for: Flexeril 5 mg to take at bedtime; Counseling performed regarding proper use of muscle relaxant medication. Patient was educated not to drink alcohol, drive any vehicle, or do any dangerous activities while taking this medication.   Use muscle  relaxer medication only under direct supervision at the lowest possible dose needed to control your pain.   Patient instructed to continue topical NSAID use, heat, gentle stretching to help with pain.   ED return instructions discussed: Worsening, severe, or uncontrolled pain or swelling, worsening headache, mental status change or vomiting, developing weakness, numbness or trouble walking.  Follow-up instructions discussed: Encouraged PCP follow-up if symptoms are persistent or not much improved after 1 week.                           Medical Decision Making Amount and/or Complexity of Data Reviewed Radiology: ordered.  Patient with neck pain starting a couple of days ago after he woke up.  No injuries.  X-ray negative for fracture.  Considered vascular dissection in the neck, however patient has palpable muscle spasm and no strokelike symptoms.  No concern for central cord syndrome, epidural abscess, discitis, osteomyelitis.  We will continue to treat conservatively and have patient follow-up with his primary care doctor.  The patient's vital signs, pertinent lab work and imaging were reviewed and interpreted as discussed in the ED course. Hospitalization was considered for further testing, treatments, or serial exams/observation. However as patient is well-appearing, has a stable exam, and reassuring studies today, I do not feel that they warrant admission at this time. This plan  was discussed with the patient who verbalizes agreement and comfort with this plan and seems reliable and able to return to the Emergency Department with worsening or changing symptoms.          Final Clinical Impression(s) / ED Diagnoses Final diagnoses:  Cervical paraspinal muscle spasm    Rx / DC Orders ED Discharge Orders          Ordered    cyclobenzaprine (FLEXERIL) 5 MG tablet  Daily at bedtime        01/13/22 1116              Carlisle Cater, PA-C 01/13/22 1118    Pattricia Boss, MD 01/18/22 [phone]

## 2022-01-15 ENCOUNTER — Other Ambulatory Visit: Payer: Self-pay | Admitting: Legal Medicine

## 2022-01-15 DIAGNOSIS — E1159 Type 2 diabetes mellitus with other circulatory complications: Secondary | ICD-10-CM

## 2022-01-24 ENCOUNTER — Other Ambulatory Visit: Payer: Self-pay

## 2022-01-24 DIAGNOSIS — M1990 Unspecified osteoarthritis, unspecified site: Secondary | ICD-10-CM | POA: Insufficient documentation

## 2022-01-25 ENCOUNTER — Ambulatory Visit: Payer: Medicare HMO | Admitting: Cardiology

## 2022-01-25 ENCOUNTER — Other Ambulatory Visit: Payer: Self-pay

## 2022-01-25 DIAGNOSIS — M542 Cervicalgia: Secondary | ICD-10-CM

## 2022-02-03 ENCOUNTER — Telehealth: Payer: Self-pay

## 2022-02-15 ENCOUNTER — Other Ambulatory Visit: Payer: Self-pay | Admitting: Legal Medicine

## 2022-02-15 ENCOUNTER — Other Ambulatory Visit: Payer: Self-pay | Admitting: Family Medicine

## 2022-02-15 DIAGNOSIS — E1159 Type 2 diabetes mellitus with other circulatory complications: Secondary | ICD-10-CM

## 2022-03-02 ENCOUNTER — Ambulatory Visit: Payer: Medicare HMO | Admitting: Cardiology

## 2022-03-02 ENCOUNTER — Encounter: Payer: Self-pay | Admitting: Cardiology

## 2022-03-02 VITALS — BP 126/54 | HR 71 | Ht 67.0 in | Wt 207.0 lb

## 2022-03-02 DIAGNOSIS — R011 Cardiac murmur, unspecified: Secondary | ICD-10-CM

## 2022-03-02 DIAGNOSIS — R9431 Abnormal electrocardiogram [ECG] [EKG]: Secondary | ICD-10-CM

## 2022-03-02 DIAGNOSIS — I10 Essential (primary) hypertension: Secondary | ICD-10-CM | POA: Diagnosis not present

## 2022-03-02 DIAGNOSIS — E1159 Type 2 diabetes mellitus with other circulatory complications: Secondary | ICD-10-CM

## 2022-03-02 DIAGNOSIS — E782 Mixed hyperlipidemia: Secondary | ICD-10-CM | POA: Diagnosis not present

## 2022-03-02 DIAGNOSIS — I25119 Atherosclerotic heart disease of native coronary artery with unspecified angina pectoris: Secondary | ICD-10-CM | POA: Diagnosis not present

## 2022-03-02 HISTORY — DX: Abnormal electrocardiogram (ECG) (EKG): R94.31

## 2022-03-02 HISTORY — DX: Cardiac murmur, unspecified: R01.1

## 2022-03-02 MED ORDER — NITROGLYCERIN 0.4 MG SL SUBL: SUBLINGUAL_TABLET | SUBLINGUAL | 11 refills | Status: DC

## 2022-03-02 NOTE — Patient Instructions (Signed)
Medication Instructions:  Your physician recommends that you continue on your current medications as directed. Please refer to the Current Medication list given to you today.  *If you need a refill on your cardiac medications before your next appointment, please call your pharmacy*   Lab Work: None ordered If you have labs (blood work) drawn today and your tests are completely normal, you will receive your results only by: MyChart Message (if you have MyChart) OR A paper copy in the mail If you have any lab test that is abnormal or we need to change your treatment, we will call you to review the results.   Testing/Procedures: Your physician has requested that you have an echocardiogram. Echocardiography is a painless test that uses sound waves to create images of your heart. It provides your doctor with information about the size and shape of your heart and how well your heart's chambers and valves are working. This procedure takes approximately one hour. There are no restrictions for this procedure.    Follow-Up: At [facility], you and your health needs are our priority.  As part of our continuing mission to provide you with exceptional heart care, we have created designated Provider Care Teams.  These Care Teams include your primary Cardiologist (physician) and Advanced Practice Providers (APPs -  Physician Assistants and Nurse Practitioners) who all work together to provide you with the care you need, when you need it.  We recommend signing up for the patient portal called "MyChart".  Sign up information is provided on this After Visit Summary.  MyChart is used to connect with patients for Virtual Visits (Telemedicine).  Patients are able to view lab/test results, encounter notes, upcoming appointments, etc.  Non-urgent messages can be sent to your provider as well.   To learn more about what you can do with MyChart, go to [email or web address].com.    Your next appointment:   12  month(s)  The format for your next appointment:   In [place]  Provider:   Rajan Revankar, MD   Other Instructions Echocardiogram An echocardiogram is a test that uses sound waves (ultrasound) to produce images of the heart. Images from an echocardiogram can provide important information about: Heart size and shape. The size and thickness and movement of your heart's walls. Heart muscle function and strength. Heart valve function or if you have stenosis. Stenosis is when the heart valves are too narrow. If blood is flowing backward through the heart valves (regurgitation). A tumor or infectious growth around the heart valves. Areas of heart muscle that are not working well because of poor blood flow or injury from a heart attack. Aneurysm detection. An aneurysm is a weak or damaged part of an artery wall. The wall bulges out from the normal force of blood pumping through the body. Tell a health care provider about: Any allergies you have. All medicines you are taking, including vitamins, herbs, eye drops, creams, and over-the-counter medicines. Any blood disorders you have. Any surgeries you have had. Any medical conditions you have. Whether you are pregnant or may be pregnant. What are the risks? Generally, this is a safe test. However, problems may occur, including an allergic reaction to dye (contrast) that may be used during the test. What happens before the test? No specific preparation is needed. You may eat and drink normally. What happens during the test? You will take off your clothes from the waist up and put on a hospital gown. Electrodes or electrocardiogram (ECG)patches may be placed on   your chest. The electrodes or patches are then connected to a device that monitors your heart rate and rhythm. You will lie down on a table for an ultrasound exam. A gel will be applied to your chest to help sound waves pass through your skin. A handheld device, called a transducer, will  be pressed against your chest and moved over your heart. The transducer produces sound waves that travel to your heart and bounce back (or "echo" back) to the transducer. These sound waves will be captured in real-time and changed into images of your heart that can be viewed on a video monitor. The images will be recorded on a computer and reviewed by your health care provider. You may be asked to change positions or hold your breath for a short time. This makes it easier to get different views or better views of your heart. In some cases, you may receive contrast through an IV in one of your veins. This can improve the quality of the pictures from your heart. The procedure may vary among health care providers and hospitals.   What can I expect after the test? You may return to your normal, everyday life, including diet, activities, and medicines, unless your health care provider tells you not to do that. Follow these instructions at home: It is up to you to get the results of your test. Ask your health care provider, or the department that is doing the test, when your results will be ready. Keep all follow-up visits. This is important. Summary An echocardiogram is a test that uses sound waves (ultrasound) to produce images of the heart. Images from an echocardiogram can provide important information about the size and shape of your heart, heart muscle function, heart valve function, and other possible heart problems. You do not need to do anything to prepare before this test. You may eat and drink normally. After the echocardiogram is completed, you may return to your normal, everyday life, unless your health care provider tells you not to do that. This information is not intended to replace advice given to you by your health care provider. Make sure you discuss any questions you have with your health care provider. Document Revised: 03/23/2020 Document Reviewed: 03/23/2020 Elsevier Patient  Education  2021 Elsevier Inc.   

## 2022-03-02 NOTE — Progress Notes (Signed)
Cardiology Office Note:    Date:  03/02/2022   ID:  Mark [name], DOB 1946/02/25, MRN [id number]  PCP:  Lillard Anes, MD  Cardiologist:  Jenean Lindau, MD   Referring MD: Lillard Anes,*    ASSESSMENT:    1. Essential hypertension   2. Type 2 diabetes mellitus with vascular disease ([place])   3. Mixed hyperlipidemia   4. Atherosclerosis of native coronary artery of native heart with angina pectoris (HCC)   5. Cardiac murmur   6. Abnormal EKG    PLAN:    In order of problems listed above:  Primary prevention stressed with the patient.  Importance of compliance with diet medication stressed and vocalized understanding. Abnormal EKG: Unremarkable at this time.  Intraventricular conduction delay.  I discussed my findings with the patient at extensive length and questions were answered to his satisfaction.  He has excellent effort tolerance and I do not see any reason for him to be evaluated further for this. Cardiac murmur: Echocardiogram.  To assess murmur heard on auscultation. Essential hypertension: Blood pressure stable and diet was emphasized. Mixed dyslipidemia and diabetes mellitus: This is managed by primary care.  Diet and lifestyle modification urged and patient appears to be very compliant. Patient will be seen in follow-up appointment in 6 months or earlier if the patient has any concerns.  He had multiple questions which were answered to his satisfaction.   Medication Adjustments/Labs and Tests Ordered: Current medicines are reviewed at length with the patient today.  Concerns regarding medicines are outlined above.  Orders Placed This Encounter  Procedures   EKG 12-Lead   ECHOCARDIOGRAM COMPLETE   Meds ordered this encounter  Medications   nitroGLYCERIN (NITROSTAT) 0.4 MG SL tablet    Sig: DISSOLVE ONE TABLET UNDER TONGUE AS NEEDED FOR CHEST PAIN UP TO 3 TABLETS    Dispense:  25 tablet    Refill:  11     History of Present Illness:     Mark [name] is a 76 y.o. male who is being seen today for the evaluation of abnormal EKG at the request of Lillard Anes,*.  Patient is a pleasant 76 year old male.  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  He has history of hypertension dyslipidemia and diabetes mellitus.  He denies any problems at this time and takes care of activities of daily living.  No chest pain orthopnea or PND.  He walks 5 miles on a daily basis at least 5 days a week.  With this he has no symptoms.  At the time of my evaluation, the patient is alert awake oriented and in no distress.  Past Medical History:  Diagnosis Date   Acute renal failure superimposed on stage 3a chronic kidney disease ([place]) 12/08/2020   Arthritis    Atherosclerotic heart disease of native coronary artery with angina pectoris ([place]) 07/20/2017   BMI 32.0-32.9,adult 03/29/2020   Cervical spondylosis 01/11/2022   Chronic diastolic heart failure (HCC)    Diabetic glomerulopathy ([place]) 11/27/2019   ED (erectile dysfunction) 11/27/2019   Essential hypertension    History of BPH    Hyperlipidemia 07/20/2017   LBBB (left bundle branch block) 12/27/2021   Osteoarthritis of right knee 12/08/2020   Peripheral vascular disease ([place])    Type 2 diabetes mellitus with vascular disease ([place])     Past Surgical History:  Procedure Laterality Date   HERNIA REPAIR     INGUINAL HERNIA REPAIR Bilateral  Current Medications: Current Meds  Medication Sig   albuterol (VENTOLIN HFA) 108 (90 Base) MCG/ACT inhaler Inhale 1-2 puffs into the lungs as needed for wheezing or shortness of breath.   Alcohol Swabs (DROPSAFE ALCOHOL PREP) 70 % PADS USE AS DIRECTED TWICE DAILY   aspirin 81 MG chewable tablet Chew 81 mg by mouth daily.   atorvastatin (LIPITOR) 20 MG tablet TAKE 1 TABLET EVERY DAY   Blood Glucose Calibration (TRUE METRIX LEVEL 1) Low SOLN 1 each by In Vitro route every 30 (thirty) days.   Blood Glucose Monitoring Suppl (TRUE  METRIX AIR GLUCOSE METER) w/Device KIT 1 each by Does not apply route in the morning and at bedtime.   carvedilol (COREG) 6.25 MG tablet TAKE 1 TABLET EVERY DAY   furosemide (LASIX) 20 MG tablet TAKE 1 TABLET EVERY OTHER DAY   metFORMIN (GLUCOPHAGE) 500 MG tablet TAKE 1/2 TABLET TWICE DAILY   spironolactone (ALDACTONE) 25 MG tablet TAKE 1/2 TABLET EVERY DAY   TRUE METRIX BLOOD GLUCOSE TEST test strip TEST BLOOD SUGAR IN THE MORNING AND AT BEDTIME AS INSTRUCTED   TRUEplus Lancets 30G MISC TEST BLOOD SUGAR IN THE MORNING AND AT BEDTIME.   [DISCONTINUED] nitroGLYCERIN (NITROSTAT) 0.4 MG SL tablet DISSOLVE ONE TABLET UNDER TONGUE AS NEEDED FOR CHEST PAIN UP TO 3 TABLETS     Allergies:   Penicillins   Social History   Socioeconomic History   Marital status: Single    Spouse name: Not on file   Number of children: 4   Years of education: 12   Highest education level: 12th grade  Occupational History   Occupation: retired   Tobacco Use   Smoking status: Never   Smokeless tobacco: Never  Vaping Use   Vaping Use: Never used  Substance and Sexual Activity   Alcohol use: No   Drug use: No   Sexual activity: Not Currently  Other Topics Concern   Not on file  Social History Narrative   Lives in a hotel and feels safe with his living arrangements.    Social Determinants of Health   Financial Resource Strain: Low Risk  (07/21/2021)   Overall Financial Resource Strain (CARDIA)    Difficulty of Paying Living Expenses: Not very hard  Food Insecurity: No Food Insecurity (07/21/2021)   Hunger Vital Sign    Worried About Running Out of Food in the Last Year: Never true    Ran Out of Food in the Last Year: Never true  Transportation Needs: No Transportation Needs (07/21/2021)   PRAPARE - Hydrologist (Medical): No    Lack of Transportation (Non-Medical): No  Physical Activity: Sufficiently Active (07/21/2021)   Exercise Vital Sign    Days of Exercise per Week: 7  days    Minutes of Exercise per Session: 60 min  Stress: No Stress Concern Present (07/21/2021)   [place]    Feeling of Stress : Not at all  Social Connections: Socially Isolated (07/21/2021)   Social Connection and Isolation Panel [NHANES]    Frequency of Communication with Friends and Family: More than three times a week    Frequency of Social Gatherings with Friends and Family: Never    Attends Religious Services: Never    Marine scientist or Organizations: No    Attends Archivist Meetings: Never    Marital Status: Never married     Family History: The patient's family history includes Cancer  in his mother. There is no history of Hypertension, Heart disease, or Diabetes.  ROS:   Please see the history of present illness.    All other systems reviewed and are negative.  EKGs/Labs/Other Studies Reviewed:    The following studies were reviewed today: EKG reveals sinus rhythm intraventricular conduction delay   Recent Labs: 12/27/2021: ALT 17; BUN 15; Creatinine, Ser 1.20; Hemoglobin 14.0; Platelets 238; Potassium 4.9; Sodium 146  Recent Lipid Panel    Component Value Date/Time   CHOL 145 12/27/2021 0856   TRIG 75 12/27/2021 0856   HDL 44 12/27/2021 0856   CHOLHDL 3.3 12/27/2021 0856   LDLCALC 86 12/27/2021 0856    Physical Exam:    VS:  BP (!) 126/54   Pulse 71   Ht _0  (1.702 m)   Wt 207 lb (93.9 kg)   SpO2 98%   BMI 32.42 kg/m     Wt Readings from Last 3 Encounters:  03/02/22 207 lb (93.9 kg)  01/13/22 210 lb (95.3 kg)  01/11/22 210 lb (95.3 kg)     GEN: Patient is in no acute distress HEENT: Normal NECK: No JVD; No carotid bruits LYMPHATICS: No lymphadenopathy CARDIAC: S1 S2 regular, 2/6 systolic murmur at the apex. RESPIRATORY:  Clear to auscultation without rales, wheezing or rhonchi  ABDOMEN: Soft, non-tender, non-distended MUSCULOSKELETAL:  No edema; No deformity   SKIN: Warm and dry NEUROLOGIC:  Alert and oriented x 3 PSYCHIATRIC:  Normal affect    Signed, Jenean Lindau, MD  03/02/2022 3:13 PM    [facility]

## 2022-03-07 ENCOUNTER — Telehealth: Payer: Self-pay

## 2022-03-07 ENCOUNTER — Telehealth: Payer: Medicare HMO

## 2022-03-07 NOTE — Telephone Encounter (Signed)
  Care Management   Follow Up Note   03/07/2022 Name: Mark [name] MRN: [id number] DOB: 20-Jan-1946   Referred by: Abigail Miyamoto, MD Reason for referral : Chronic Care Management   Third unsuccessful telephone outreach was attempted today. The patient was referred to the case management team for assistance with care management and care coordination. The patient's primary care provider has been notified of our unsuccessful attempts to make or maintain contact with the patient. The care management team is pleased to engage with this patient at any time in the future should he/she be interested in assistance from the care management team.   Follow Up Plan: We have been unable to make contact with the patient for follow up. The care management team is available to follow up with the patient after provider conversation with the patient regarding recommendation for care management engagement and subsequent re-referral to the care management team.   [name] RN, BSN, CEN RN Case Manager - [facility] Mobile: [phone]

## 2022-03-09 ENCOUNTER — Ambulatory Visit (INDEPENDENT_AMBULATORY_CARE_PROVIDER_SITE_OTHER): Payer: Medicare HMO

## 2022-03-09 DIAGNOSIS — I25119 Atherosclerotic heart disease of native coronary artery with unspecified angina pectoris: Secondary | ICD-10-CM | POA: Diagnosis not present

## 2022-03-09 LAB — ECHOCARDIOGRAM COMPLETE
Area-P 1/2: 2.93 cm2
S' Lateral: 4.4 cm

## 2022-03-16 ENCOUNTER — Encounter: Payer: Self-pay | Admitting: Cardiology

## 2022-03-16 ENCOUNTER — Ambulatory Visit: Payer: Medicare HMO | Admitting: Cardiology

## 2022-03-16 ENCOUNTER — Encounter: Payer: Self-pay | Admitting: Legal Medicine

## 2022-03-16 VITALS — BP 118/60 | HR 72 | Ht 67.0 in | Wt 208.2 lb

## 2022-03-16 DIAGNOSIS — I10 Essential (primary) hypertension: Secondary | ICD-10-CM

## 2022-03-16 DIAGNOSIS — E1121 Type 2 diabetes mellitus with diabetic nephropathy: Secondary | ICD-10-CM | POA: Diagnosis not present

## 2022-03-16 DIAGNOSIS — E1159 Type 2 diabetes mellitus with other circulatory complications: Secondary | ICD-10-CM

## 2022-03-16 DIAGNOSIS — I429 Cardiomyopathy, unspecified: Secondary | ICD-10-CM

## 2022-03-16 DIAGNOSIS — I5020 Unspecified systolic (congestive) heart failure: Secondary | ICD-10-CM

## 2022-03-16 HISTORY — DX: Unspecified systolic (congestive) heart failure: I50.20

## 2022-03-16 HISTORY — DX: Cardiomyopathy, unspecified: I42.9

## 2022-03-16 NOTE — Progress Notes (Signed)
Cardiology Office Note:    Date:  03/16/2022   ID:  Mark [name], DOB 05/25/1946, MRN [id number]  PCP:  Lillard Anes, MD  Cardiologist:  Jenean Lindau, MD   Referring MD: Lillard Anes,*    ASSESSMENT:    1. Essential hypertension   2. Cardiomyopathy, unspecified type ([place])   3. HFrEF (heart failure with reduced ejection fraction) ([place])   4. Type 2 diabetes mellitus with vascular disease ([place])   5. Diabetic glomerulopathy (HCC)    PLAN:    In order of problems listed above:  Cardiomyopathy: New diagnosis: Significant and advanced in nature.  Dyspnea on exertion.  In view of this I discussed further evaluation with the patient.  Coronary angiography and left heart catheterization was discussed.I discussed coronary angiography and left heart catheterization with the patient at extensive length. Procedure, benefits and potential risks were explained. Patient had multiple questions which were answered to the patient's satisfaction. Patient agreed and consented for the procedure. Further recommendations will be made based on the findings of the coronary angiography. In the interim. The patient has any significant symptoms he knows to go to the nearest emergency room.  He is agreeable.  I will leave it up to my interventional colleague to see whether he can skip the left ventriculography in view of diabetes mellitus and history of renal insufficiency in the past.  Current kidney functions appear to be reasonably. Essential hypertension: Blood pressure stable and diet was emphasized. Mixed dyslipidemia and diabetes mellitus: Managed by primary care.  Diet emphasized. Patient will be seen in follow-up appointment after coronary angiography.   Medication Adjustments/Labs and Tests Ordered: Current medicines are reviewed at length with the patient today.  Concerns regarding medicines are outlined above.  No orders of the defined types were placed in this encounter.  No  orders of the defined types were placed in this encounter.    No chief complaint on file.    History of Present Illness:    Mark [name] is a 76 y.o. male.  Patient has past medical history of essential hypertension, dyslipidemia, diabetes mellitus.  He was evaluated for dyspnea on exertion and he underwent echocardiographic evaluation and this revealed severe depression ejection fraction.  I asked the patient multiple number of times but he denies any history of cardiovascular disease or evaluation for this.  This is new information to him.  He gives history of dyspnea on exertion.  No chest pain.  At the time of my evaluation, the patient is alert awake oriented and in no distress.  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   Alcohol Swabs (DROPSAFE ALCOHOL PREP) 70 % PADS USE AS DIRECTED TWICE DAILY   aspirin 81 MG chewable tablet Chew 81 mg by mouth daily.   atorvastatin (LIPITOR) 20 MG tablet TAKE 1 TABLET EVERY DAY   Blood Glucose Calibration (TRUE METRIX  LEVEL 1) Low SOLN 1 each by In Vitro route every 30 (thirty) days.   Blood Glucose Monitoring Suppl (TRUE METRIX AIR GLUCOSE METER) w/Device KIT 1 each by Does not apply route in the morning and at bedtime.   carvedilol (COREG) 6.25 MG tablet TAKE 1 TABLET EVERY DAY   cyclobenzaprine (FLEXERIL) 5 MG tablet Take 1 tablet (5 mg total) by mouth at bedtime.   furosemide (LASIX) 20 MG tablet TAKE 1 TABLET EVERY OTHER DAY   metFORMIN (GLUCOPHAGE) 500 MG tablet TAKE 1/2 TABLET TWICE DAILY   nitroGLYCERIN (NITROSTAT) 0.4 MG SL tablet DISSOLVE ONE TABLET UNDER TONGUE AS NEEDED FOR CHEST PAIN UP TO 3 TABLETS   spironolactone (ALDACTONE) 25 MG tablet TAKE 1/2 TABLET EVERY DAY   TRUE METRIX BLOOD GLUCOSE TEST test strip TEST BLOOD SUGAR IN THE MORNING AND AT BEDTIME AS INSTRUCTED   TRUEplus Lancets 30G MISC TEST BLOOD SUGAR IN THE MORNING AND AT BEDTIME.     Allergies:   Penicillins   Social History   Socioeconomic History   Marital status: Single    Spouse name: Not on file   Number of children: 4   Years of education: 12   Highest education level: 12th grade  Occupational History   Occupation: retired   Tobacco Use   Smoking status: Never   Smokeless tobacco: Never  Vaping Use   Vaping Use: Never used  Substance and Sexual Activity   Alcohol use: No   Drug use: No   Sexual activity: Not Currently  Other Topics Concern   Not on file  Social History Narrative   Lives in a hotel and feels safe with his living arrangements.    Social Determinants of Health   Financial Resource Strain: Low Risk  (07/21/2021)   Overall Financial Resource Strain (CARDIA)    Difficulty of Paying Living Expenses: Not very hard  Food Insecurity: No Food Insecurity (07/21/2021)   Hunger Vital Sign    Worried About Running Out of Food in the Last Year: Never true    Ran Out of Food in the Last Year: Never true  Transportation Needs: No Transportation Needs (07/21/2021)   PRAPARE - Radiographer, therapeutic (Medical): No    Lack of Transportation (Non-Medical): No  Physical Activity: Sufficiently Active (07/21/2021)   Exercise Vital Sign    Days of Exercise per Week: 7 days    Minutes of Exercise per Session: 60 min  Stress: No Stress Concern Present (07/21/2021)   [place]    Feeling of Stress : Not at all  Social Connections: Socially Isolated (07/21/2021)   Social Connection and Isolation Panel [NHANES]    Frequency of Communication with Friends and Family: More than three times a week    Frequency of Social Gatherings with Friends and Family: Never    Attends Religious Services: Never    Marine scientist or Organizations: No    Attends Archivist Meetings: Never    Marital Status: Never married     Family History: The patient's family history includes Cancer  in his mother. There is no history of Hypertension, Heart disease, or Diabetes.  ROS:   Please see the history of present illness.    All other systems reviewed and are negative.  EKGs/Labs/Other Studies Reviewed:    The following studies were reviewed today: IMPRESSIONS     1. Left ventricular ejection fraction, by estimation, is 25 to 30%. The  left ventricle has severely decreased function. The left ventricle  demonstrates regional wall motion abnormalities (see scoring  diagram/findings for description). Left ventricular  diastolic parameters are consistent with Grade I diastolic dysfunction  (impaired relaxation). There is moderate akinesis of the left ventricular,  entire anterior wall and anteroseptal wall. There is moderate akinesis of  the left ventricular, entire  apical segment.   2. Right ventricular systolic function is normal. The right ventricular  size is normal.   3. The mitral valve is normal in structure. No evidence of mitral valve  regurgitation. No evidence of mitral stenosis.   4. The aortic valve is  normal in structure. Aortic valve regurgitation is  not visualized. No aortic stenosis is present.   5. The inferior vena cava is normal in size with greater than 50%  respiratory variability, suggesting right atrial pressure of 3 mmHg.    Recent Labs: 12/27/2021: ALT 17; BUN 15; Creatinine, Ser 1.20; Hemoglobin 14.0; Platelets 238; Potassium 4.9; Sodium 146  Recent Lipid Panel    Component Value Date/Time   CHOL 145 12/27/2021 0856   TRIG 75 12/27/2021 0856   HDL 44 12/27/2021 0856   CHOLHDL 3.3 12/27/2021 0856   LDLCALC 86 12/27/2021 0856    Physical Exam:    VS:  BP 118/60   Pulse 72   Ht $R'5\' 7"'RY$  (1.702 m)   Wt 208 lb 3.2 oz (94.4 kg)   SpO2 97%   BMI 32.61 kg/m     Wt Readings from Last 3 Encounters:  03/16/22 208 lb 3.2 oz (94.4 kg)  03/02/22 207 lb (93.9 kg)  01/13/22 210 lb (95.3 kg)     GEN: Patient is in no acute distress HEENT: Normal NECK: No JVD; No carotid bruits LYMPHATICS: No lymphadenopathy CARDIAC: Hear sounds regular, 2/6 systolic murmur at the apex. RESPIRATORY:  Clear to auscultation without rales, wheezing or rhonchi  ABDOMEN: Soft, non-tender, non-distended MUSCULOSKELETAL:  No edema; No deformity  SKIN: Warm and dry NEUROLOGIC:  Alert and oriented x 3 PSYCHIATRIC:  Normal affect   Signed, Jenean Lindau, MD  03/16/2022 2:00 PM    [place]

## 2022-03-16 NOTE — Addendum Note (Signed)
Addended by: Eleonore Chiquito on: 03/16/2022 02:11 PM   Modules accepted: Orders

## 2022-03-16 NOTE — Patient Instructions (Signed)
Medication Instructions:  Your physician has recommended you make the following change in your medication:   Take 81 mg coated aspirin daily. Use nitroglycerin as needed for chest pain.  *If you need a refill on your cardiac medications before your next appointment, please call your pharmacy*   Lab Work: Your physician recommends that you have a BMET and CBC today in the office for your upcoming procedure.  If you have labs (blood work) drawn today and your tests are completely normal, you will receive your results only by: [place] (if you have MyChart) OR A paper copy in the mail If you have any lab test that is abnormal or we need to change your treatment, we will call you to review the results.   Testing/Procedures:  [place] [address] Dept: [phone] Loc: [place]  03/16/2022  You are scheduled for a Cardiac Catheterization on Thursday, August 10 with Dr. Peter Martinique.  1. Please arrive at the Main Entrance A at [facility]: [place], [place] [address] at 7:00 AM (This time is two hours before your procedure to ensure your preparation). Free valet parking service is available.   Special note: Every effort is made to have your procedure done on time. Please understand that emergencies sometimes delay scheduled procedures.  2. Diet: Do not eat solid foods after midnight.  You may have clear liquids until 5 AM upon the day of the procedure.  3. Labs: You had your labs done today in the office.  4. Medication instructions in preparation for your procedure:   Contrast Allergy: No   Current Outpatient Medications (Endocrine & Metabolic):    metFORMIN (GLUCOPHAGE) 500 MG tablet, TAKE 1/2 TABLET TWICE DAILY  Current Outpatient Medications (Cardiovascular):    atorvastatin (LIPITOR) 20 MG tablet, TAKE 1 TABLET  EVERY DAY   carvedilol (COREG) 6.25 MG tablet, TAKE 1 TABLET EVERY DAY   furosemide (LASIX) 20 MG tablet, TAKE 1 TABLET EVERY OTHER DAY   nitroGLYCERIN (NITROSTAT) 0.4 MG SL tablet, DISSOLVE ONE TABLET UNDER TONGUE AS NEEDED FOR CHEST PAIN UP TO 3 TABLETS   spironolactone (ALDACTONE) 25 MG tablet, TAKE 1/2 TABLET EVERY DAY  Current Outpatient Medications (Respiratory):    albuterol (VENTOLIN HFA) 108 (90 Base) MCG/ACT inhaler, Inhale 1-2 puffs into the lungs as needed for wheezing or shortness of breath.  Current Outpatient Medications (Analgesics):    aspirin 81 MG chewable tablet, Chew 81 mg by mouth daily.   Current Outpatient Medications (Other):    Alcohol Swabs (DROPSAFE ALCOHOL PREP) 70 % PADS, USE AS DIRECTED TWICE DAILY   Blood Glucose Calibration (TRUE METRIX LEVEL 1) Low SOLN, 1 each by In Vitro route every 30 (thirty) days.   Blood Glucose Monitoring Suppl (TRUE METRIX AIR GLUCOSE METER) w/Device KIT, 1 each by Does not apply route in the morning and at bedtime.   cyclobenzaprine (FLEXERIL) 5 MG tablet, Take 1 tablet (5 mg total) by mouth at bedtime.   TRUE METRIX BLOOD GLUCOSE TEST test strip, TEST BLOOD SUGAR IN THE MORNING AND AT BEDTIME AS INSTRUCTED   TRUEplus Lancets 30G MISC, TEST BLOOD SUGAR IN THE MORNING AND AT BEDTIME. *For reference purposes while preparing patient instructions.   Delete this med list prior to printing instructions for patient.*   Stop taking, Lasix (Furosemide)  Thursday, August 10,  Do not take Diabetes Med Glucophage (Metformin) on the  day of the procedure and HOLD 48 HOURS AFTER THE PROCEDURE.  On the morning of your procedure, take Aspirin and any morning medicines NOT listed above.  You may use sips of water.  5. Plan to go home the same day, you will only stay overnight if medically necessary. 6. You MUST have a responsible adult to drive you home. 7. An adult MUST be with you the first 24 hours after you arrive home. 8. Bring a current  list of your medications, and the last time and date medication taken. 9. Bring ID and current insurance cards. 10.Please wear clothes that are easy to get on and off and wear slip-on shoes.  Thank you for allowing [place] to care for you!   -- [facility]    Follow-Up: At [facility], you and your health needs are our priority.  As part of our continuing mission to provide you with exceptional heart care, we have created designated Provider Care Teams.  These Care Teams include your primary Cardiologist (physician) and Advanced Practice Providers (APPs -  Physician Assistants and Nurse Practitioners) who all work together to provide you with the care you need, when you need it.  We recommend signing up for the patient portal called "MyChart".  Sign up information is provided on this After Visit Summary.  MyChart is used to connect with patients for Virtual Visits (Telemedicine).  Patients are able to view lab/test results, encounter notes, upcoming appointments, etc.  Non-urgent messages can be sent to your provider as well.   To learn more about what you can do with MyChart, go to [email or web address].    Your next appointment:   1 month(s)  The format for your next appointment:   In [place]  Provider:   Jyl Heinz, MD   Other Instructions  Coronary Angiogram With Stent Coronary angiogram with stent placement is a procedure to widen or open a narrow blood vessel of the heart (coronary artery). Arteries may become blocked by cholesterol buildup (plaques) in the lining of the artery wall. When a coronary artery becomes partially blocked, blood flow to that area decreases. This may lead to chest pain or a heart attack (myocardial infarction). A stent is a small piece of metal that looks like mesh or spring. Stent placement may be done as treatment after a heart attack, or to prevent a heart attack if a blocked artery is found by a coronary  angiogram. Let your health care provider know about: Any allergies you have, including allergies to medicines or contrast dye. All medicines you are taking, including vitamins, herbs, eye drops, creams, and over-the-counter medicines. Any problems you or family members have had with anesthetic medicines. Any blood disorders you have. Any surgeries you have had. Any medical conditions you have, including kidney problems or kidney failure. Whether you are pregnant or may be pregnant. Whether you are breastfeeding. What are the risks? Generally, this is a safe procedure. However, serious problems may occur, including: Damage to nearby structures or organs, such as the heart, blood vessels, or kidneys. A return of blockage. Bleeding, infection, or bruising at the insertion site. A collection of blood under the skin (hematoma) at the insertion site. A blood clot in another part of the body. Allergic reaction to medicines or dyes. Bleeding into the abdomen (retroperitoneal bleeding). Stroke (rare). Heart attack (rare). What happens before the procedure? Staying hydrated Follow instructions from your health care provider about hydration, which may include: Up to 2 hours before  the procedure - you may continue to drink clear liquids, such as water, clear fruit juice, black coffee, and plain tea.    Eating and drinking restrictions Follow instructions from your health care provider about eating and drinking, which may include: 8 hours before the procedure - stop eating heavy meals or foods, such as meat, fried foods, or fatty foods. 6 hours before the procedure - stop eating light meals or foods, such as toast or cereal. 2 hours before the procedure - stop drinking clear liquids. Medicines Ask your health care provider about: Changing or stopping your regular medicines. This is especially important if you are taking diabetes medicines or blood thinners. Taking medicines such as aspirin and  ibuprofen. These medicines can thin your blood. Do not take these medicines unless your health care provider tells you to take them. Generally, aspirin is recommended before a thin tube, called a catheter, is passed through a blood vessel and inserted into the heart (cardiac catheterization). Taking over-the-counter medicines, vitamins, herbs, and supplements. General instructions Do not use any products that contain nicotine or tobacco for at least 4 weeks before the procedure. These products include cigarettes, e-cigarettes, and chewing tobacco. If you need help quitting, ask your health care provider. Plan to have someone take you home from the hospital or clinic. If you will be going home right after the procedure, plan to have someone with you for 24 hours. You may have tests and imaging procedures. Ask your health care provider: How your insertion site will be marked. Ask which artery will be used for the procedure. What steps will be taken to help prevent infection. These may include: Removing hair at the insertion site. Washing skin with a germ-killing soap. Taking antibiotic medicine. What happens during the procedure? An IV will be inserted into one of your veins. Electrodes may be placed on your chest to monitor your heart rate during the procedure. You will be given one or more of the following: A medicine to help you relax (sedative). A medicine to numb the area (local anesthetic) for catheter insertion. A small incision will be made for catheter insertion. The catheter will be inserted into an artery using a guide wire. The location may be in your groin, your wrist, or the fold of your arm (near your elbow). An X-ray procedure (fluoroscopy) will be used to help guide the catheter to the opening of the heart arteries. A dye will be injected into the catheter. X-rays will be taken. The dye helps to show where any narrowing or blockages are located in the arteries. Tell your health  care provider if you have chest pain or trouble breathing. A tiny wire will be guided to the blocked spot, and a balloon will be inflated to make the artery wider. The stent will be expanded to crush the plaques into the wall of the vessel. The stent will hold the area open and improve the blood flow. Most stents have a drug coating to reduce the risk of the stent narrowing over time. The artery may be made wider using a drill, laser, or other tools that remove plaques. The catheter will be removed when the blood flow improves. The stent will stay where it was placed, and the lining of the artery will grow over it. A bandage (dressing) will be placed on the insertion site. Pressure will be applied to stop bleeding. The IV will be removed. This procedure may vary among health care providers and hospitals.    What  happens after the procedure? Your blood pressure, heart rate, breathing rate, and blood oxygen level will be monitored until you leave the hospital or clinic. If the procedure is done through the leg, you will lie flat in bed for a few hours or for as long as told by your health care provider. You will be instructed not to bend or cross your legs. The insertion site and the pulse in your foot or wrist will be checked often. You may have more blood tests, X-rays, and a test that records the electrical activity of your heart (electrocardiogram, or ECG). Do not drive for 24 hours if you were given a sedative during your procedure. Summary Coronary angiogram with stent placement is a procedure to widen or open a narrowed coronary artery. This is done to treat heart problems. Before the procedure, let your health care provider know about all the medical conditions and surgeries you have or have had. This is a safe procedure. However, some problems may occur, including damage to nearby structures or organs, bleeding, blood clots, or allergies. Follow your health care provider's instructions  about eating, drinking, medicines, and other lifestyle changes, such as quitting tobacco use before the procedure. This information is not intended to replace advice given to you by your health care provider. Make sure you discuss any questions you have with your health care provider. Document Revised: 02/19/2019 Document Reviewed: 02/19/2019 Elsevier Patient Education  2021 [place].  Aspirin and Your Heart Aspirin is a medicine that prevents the platelets in your blood from sticking together. Platelets are the cells that your blood uses for clotting. Aspirin can be used to help reduce the risk of blood clots, heart attacks, and other heart-related problems. What are the risks? Daily use of aspirin can cause side effects. Some of these include: Bleeding. Bleeding can be minor or serious. An example of minor bleeding is bleeding from a cut, and the bleeding does not stop. An example of more serious bleeding is stomach bleeding or, rarely, bleeding into the brain. Your risk of bleeding increases if you are also taking NSAIDs, such as ibuprofen. Increased bruising. Upset stomach. An allergic reaction. People who have growths inside the nose (nasal polyps) have an increased risk of developing an aspirin allergy. How to use aspirin to care for your heart Take aspirin only as told by your health care provider. Make sure that you understand how much to take and what form to take. The two forms of aspirin are: Non-enteric-coated.This type of aspirin does not have a coating and is absorbed quickly. This type of aspirin also comes in a chewable form. Enteric-coated. This type of aspirin has a coating that releases the medicine very slowly. Enteric-coated aspirin might cause less stomach upset than non-enteric-coated aspirin. This type of aspirin should not be chewed or crushed. Work with your health care provider to find out whether it is safe and beneficial for you to take aspirin daily. Taking aspirin  daily may be helpful if: You have had a heart attack or chest pain, or you are at risk for a heart attack. You have a condition in which certain heart vessels are blocked (coronary artery disease), and you have had a procedure to treat it. Examples are: Open-heart surgery, such as coronary artery bypass surgery (CABG). Coronary angioplasty,which is done to widen a blood vessel of your heart. Having a small mesh tube, or stent, placed in your coronary artery. You have had certain types of stroke or a mini-stroke known  as a transient ischemic attack (TIA). You have a narrowing of the arteries that supply the limbs (peripheral artery disease, or PAD). You have long-term (chronic) heart rhythm problems, such as atrial fibrillation, and your health care provider thinks aspirin may help. You have valve disease or have had surgery on a valve. You are considered at increased risk of developing coronary artery disease or PAD.    Follow these instructions at home Medicines Take over-the-counter and prescription medicines only as told by your health care provider. If you are taking blood thinners: Talk with your health care provider before you take any medicines that contain aspirin or NSAIDs, such as ibuprofen. These medicines increase your risk for dangerous bleeding. Take your medicine exactly as told, at the same time every day. Avoid activities that could cause injury or bruising, and follow instructions about how to prevent falls. Wear a medical alert bracelet or carry a card that lists what medicines you take. General instructions Do not drink alcohol if: Your health care provider tells you not to drink. You are pregnant, may be pregnant, or are planning to become pregnant. If you drink alcohol: Limit how much you use to: 0-1 drink a day for women. 0-2 drinks a day for men. Be aware of how much alcohol is in your drink. In the [place] one drink equals one 12 oz bottle of beer (355 mL), one 5 oz  glass of wine (148 mL), or one 1 oz glass of hard liquor (44 mL). Keep all follow-up visits as told by your health care provider. This is important. Where to find more information The American Heart Association: www.heart.org Contact a health care provider if you have: Unusual bleeding or bruising. Stomach pain or nausea. Ringing in your ears. An allergic reaction that causes hives, itchy skin, or swelling of the lips, tongue, or face. Get help right away if: You notice that your bowel movements are bloody, or dark red or black in color. You vomit or cough up blood. You have blood in your urine. You cough, breathe loudly (wheeze), or feel short of breath. You have chest pain, especially if the pain spreads to your arms, back, neck, or jaw. You have a headache with confusion. You have any symptoms of a stroke. "BE FAST" is an easy way to remember the main warning signs of a stroke: B - Balance. Signs are dizziness, sudden trouble walking, or loss of balance. E - Eyes. Signs are trouble seeing or a sudden change in vision. F - Face. Signs are sudden weakness or numbness of the face, or the face or eyelid drooping on one side. A - Arms. Signs are weakness or numbness in an arm. This happens suddenly and usually on one side of the body. S - Speech. Signs are sudden trouble speaking, slurred speech, or trouble understanding what people say. T - Time. Time to call emergency services. Write down what time symptoms started. You have other signs of a stroke, such as: A sudden, severe headache with no known cause. Nausea or vomiting. Seizure. These symptoms may represent a serious problem that is an emergency. Do not wait to see if the symptoms will go away. Get medical help right away. Call your local emergency services (911 in the [place] Do not drive yourself to the hospital. Summary Aspirin use can help reduce the risk of blood clots, heart attacks, and other heart-related problems. Daily use  of aspirin can cause side effects. Take aspirin only as told by your health  care provider. Make sure that you understand how much to take and what form to take. Your health care provider will help you determine whether it is safe and beneficial for you to take aspirin daily. This information is not intended to replace advice given to you by your health care provider. Make sure you discuss any questions you have with your health care provider. Document Revised: 05/05/2019 Document Reviewed: 05/05/2019 Elsevier Patient Education  2021 [place]. Nitroglycerin sublingual tablets What is this medicine? NITROGLYCERIN (nye troe GLI ser in) is a type of vasodilator. It relaxes blood vessels, increasing the blood and oxygen supply to your heart. This medicine is used to relieve chest pain caused by angina. It is also used to prevent chest pain before activities like climbing stairs, going outdoors in cold weather, or sexual activity. This medicine may be used for other purposes; ask your health care provider or pharmacist if you have questions. COMMON BRAND NAME(S): Nitroquick, Nitrostat, Nitrotab What should I tell my health care provider before I take this medicine? They need to know if you have any of these conditions: anemia head injury, recent stroke, or bleeding in the brain liver disease previous heart attack an unusual or allergic reaction to nitroglycerin, other medicines, foods, dyes, or preservatives pregnant or trying to get pregnant breast-feeding How should I use this medicine? Take this medicine by mouth as needed. Use at the first sign of an angina attack (chest pain or tightness). You can also take this medicine 5 to 10 minutes before an event likely to produce chest pain. Follow the directions exactly as written on the prescription label. Place one tablet under your tongue and let it dissolve. Do not swallow whole. Replace the dose if you accidentally swallow it. It will help if  your mouth is not dry. Saliva around the tablet will help it to dissolve more quickly. Do not eat or drink, smoke or chew tobacco while a tablet is dissolving. Sit down when taking this medicine. In an angina attack, you should feel better within 5 minutes after your first dose. You can take a dose every 5 minutes up to a total of 3 doses. If you do not feel better or feel worse after 1 dose, call 9-1-1 at once. Do not take more than 3 doses in 15 minutes. Your health care provider might give you other directions. Follow those directions if he or she does. Do not take your medicine more often than directed. Talk to your health care provider about the use of this medicine in children. Special care may be needed. Overdosage: If you think you have taken too much of this medicine contact a poison control center or emergency room at once. NOTE: This medicine is only for you. Do not share this medicine with others. What if I miss a dose? This does not apply. This medicine is only used as needed. What may interact with this medicine? Do not take this medicine with any of the following medications: certain migraine medicines like ergotamine and dihydroergotamine (DHE) medicines used to treat erectile dysfunction like sildenafil, tadalafil, and vardenafil riociguat This medicine may also interact with the following medications: alteplase aspirin heparin medicines for high blood pressure medicines for mental depression other medicines used to treat angina phenothiazines like chlorpromazine, mesoridazine, prochlorperazine, thioridazine This list may not describe all possible interactions. Give your health care provider a list of all the medicines, herbs, non-prescription drugs, or dietary supplements you use. Also tell them if you smoke, drink  alcohol, or use illegal drugs. Some items may interact with your medicine. What should I watch for while using this medicine? Tell your doctor or health care  professional if you feel your medicine is no longer working. Keep this medicine with you at all times. Sit or lie down when you take your medicine to prevent falling if you feel dizzy or faint after using it. Try to remain calm. This will help you to feel better faster. If you feel dizzy, take several deep breaths and lie down with your feet propped up, or bend forward with your head resting between your knees. You may get drowsy or dizzy. Do not drive, use machinery, or do anything that needs mental alertness until you know how this drug affects you. Do not stand or sit up quickly, especially if you are an older patient. This reduces the risk of dizzy or fainting spells. Alcohol can make you more drowsy and dizzy. Avoid alcoholic drinks. Do not treat yourself for coughs, colds, or pain while you are taking this medicine without asking your doctor or health care professional for advice. Some ingredients may increase your blood pressure. What side effects may I notice from receiving this medicine? Side effects that you should report to your doctor or health care professional as soon as possible: allergic reactions (skin rash, itching or hives; swelling of the face, lips, or tongue) low blood pressure (dizziness; feeling faint or lightheaded, falls; unusually weak or tired) low red blood cell counts (trouble breathing; feeling faint; lightheaded, falls; unusually weak or tired) Side effects that usually do not require medical attention (report to your doctor or health care professional if they continue or are bothersome): facial flushing (redness) headache nausea, vomiting This list may not describe all possible side effects. Call your doctor for medical advice about side effects. You may report side effects to FDA at 1-800-FDA-1088. Where should I keep my medicine? Keep out of the reach of children. Store at room temperature between 20 and 25 degrees C (68 and 77 degrees F). Store in [employer], industrial/product. Protect from light and moisture. Keep tightly closed. Throw away any unused medicine after the expiration date. NOTE: This sheet is a summary. It may not cover all possible information. If you have questions about this medicine, talk to your doctor, pharmacist, or health care provider.  2021 Elsevier/Gold Standard (2018-05-01 16:46:32)

## 2022-03-16 NOTE — H&P (View-Only) (Signed)
Cardiology Office Note:    Date:  03/16/2022   ID:  Mark [name], DOB [birth year], MRN [id number]  PCP:  Lillard Anes, MD  Cardiologist:  Jenean Lindau, MD   Referring MD: Lillard Anes,*    ASSESSMENT:    1. Essential hypertension   2. Cardiomyopathy, unspecified type ([place])   3. HFrEF (heart failure with reduced ejection fraction) ([place])   4. Type 2 diabetes mellitus with vascular disease ([place])   5. Diabetic glomerulopathy (HCC)    PLAN:    In order of problems listed above:  Cardiomyopathy: New diagnosis: Significant and advanced in nature.  Dyspnea on exertion.  In view of this I discussed further evaluation with the patient.  Coronary angiography and left heart catheterization was discussed.I discussed coronary angiography and left heart catheterization with the patient at extensive length. Procedure, benefits and potential risks were explained. Patient had multiple questions which were answered to the patient's satisfaction. Patient agreed and consented for the procedure. Further recommendations will be made based on the findings of the coronary angiography. In the interim. The patient has any significant symptoms he knows to go to the nearest emergency room.  He is agreeable.  I will leave it up to my interventional colleague to see whether he can skip the left ventriculography in view of diabetes mellitus and history of renal insufficiency in the past.  Current kidney functions appear to be reasonably. Essential hypertension: Blood pressure stable and diet was emphasized. Mixed dyslipidemia and diabetes mellitus: Managed by primary care.  Diet emphasized. Patient will be seen in follow-up appointment after coronary angiography.   Medication Adjustments/Labs and Tests Ordered: Current medicines are reviewed at length with the patient today.  Concerns regarding medicines are outlined above.  No orders of the defined types were placed in this encounter.  No  orders of the defined types were placed in this encounter.    No chief complaint on file.    History of Present Illness:    Mark [name] is a 76 y.o. male.  Patient has past medical history of essential hypertension, dyslipidemia, diabetes mellitus.  He was evaluated for dyspnea on exertion and he underwent echocardiographic evaluation and this revealed severe depression ejection fraction.  I asked the patient multiple number of times but he denies any history of cardiovascular disease or evaluation for this.  This is new information to him.  He gives history of dyspnea on exertion.  No chest pain.  At the time of my evaluation, the patient is alert awake oriented and in no distress.  Past Medical History:  Diagnosis Date   Acute renal failure superimposed on stage 3a chronic kidney disease ([place]) 12/08/2020   Arthritis    Atherosclerotic heart disease of native coronary artery with angina pectoris ([place]) 07/20/2017   BMI 32.0-32.9,adult 03/29/2020   Cervical spondylosis 01/11/2022   Chronic diastolic heart failure (HCC)    Diabetic glomerulopathy ([place]) 11/27/2019   ED (erectile dysfunction) 11/27/2019   Essential hypertension    History of BPH    Hyperlipidemia 07/20/2017   LBBB (left bundle branch block) 12/27/2021   Osteoarthritis of right knee 12/08/2020   Peripheral vascular disease ([place])    Type 2 diabetes mellitus with vascular disease ([place])     Past Surgical History:  Procedure Laterality Date   HERNIA REPAIR     INGUINAL HERNIA REPAIR Bilateral     Current Medications: Current Meds  Medication Sig   albuterol (VENTOLIN HFA) 108 (90 Base) MCG/ACT  inhaler Inhale 1-2 puffs into the lungs as needed for wheezing or shortness of breath.   Alcohol Swabs (DROPSAFE ALCOHOL PREP) 70 % PADS USE AS DIRECTED TWICE DAILY   aspirin 81 MG chewable tablet Chew 81 mg by mouth daily.   atorvastatin (LIPITOR) 20 MG tablet TAKE 1 TABLET EVERY DAY   Blood Glucose Calibration (TRUE METRIX  LEVEL 1) Low SOLN 1 each by In Vitro route every 30 (thirty) days.   Blood Glucose Monitoring Suppl (TRUE METRIX AIR GLUCOSE METER) w/Device KIT 1 each by Does not apply route in the morning and at bedtime.   carvedilol (COREG) 6.25 MG tablet TAKE 1 TABLET EVERY DAY   cyclobenzaprine (FLEXERIL) 5 MG tablet Take 1 tablet (5 mg total) by mouth at bedtime.   furosemide (LASIX) 20 MG tablet TAKE 1 TABLET EVERY OTHER DAY   metFORMIN (GLUCOPHAGE) 500 MG tablet TAKE 1/2 TABLET TWICE DAILY   nitroGLYCERIN (NITROSTAT) 0.4 MG SL tablet DISSOLVE ONE TABLET UNDER TONGUE AS NEEDED FOR CHEST PAIN UP TO 3 TABLETS   spironolactone (ALDACTONE) 25 MG tablet TAKE 1/2 TABLET EVERY DAY   TRUE METRIX BLOOD GLUCOSE TEST test strip TEST BLOOD SUGAR IN THE MORNING AND AT BEDTIME AS INSTRUCTED   TRUEplus Lancets 30G MISC TEST BLOOD SUGAR IN THE MORNING AND AT BEDTIME.     Allergies:   Penicillins   Social History   Socioeconomic History   Marital status: Single    Spouse name: Not on file   Number of children: 4   Years of education: 12   Highest education level: 12th grade  Occupational History   Occupation: retired   Tobacco Use   Smoking status: Never   Smokeless tobacco: Never  Vaping Use   Vaping Use: Never used  Substance and Sexual Activity   Alcohol use: No   Drug use: No   Sexual activity: Not Currently  Other Topics Concern   Not on file  Social History Narrative   Lives in a hotel and feels safe with his living arrangements.    Social Determinants of Health   Financial Resource Strain: Low Risk  (07/21/2021)   Overall Financial Resource Strain (CARDIA)    Difficulty of Paying Living Expenses: Not very hard  Food Insecurity: No Food Insecurity (07/21/2021)   Hunger Vital Sign    Worried About Running Out of Food in the Last Year: Never true    Ran Out of Food in the Last Year: Never true  Transportation Needs: No Transportation Needs (07/21/2021)   PRAPARE - Radiographer, therapeutic (Medical): No    Lack of Transportation (Non-Medical): No  Physical Activity: Sufficiently Active (07/21/2021)   Exercise Vital Sign    Days of Exercise per Week: 7 days    Minutes of Exercise per Session: 60 min  Stress: No Stress Concern Present (07/21/2021)   [place]    Feeling of Stress : Not at all  Social Connections: Socially Isolated (07/21/2021)   Social Connection and Isolation Panel [NHANES]    Frequency of Communication with Friends and Family: More than three times a week    Frequency of Social Gatherings with Friends and Family: Never    Attends Religious Services: Never    Marine scientist or Organizations: No    Attends Archivist Meetings: Never    Marital Status: Never married     Family History: The patient's family history includes Cancer  in his mother. There is no history of Hypertension, Heart disease, or Diabetes.  ROS:   Please see the history of present illness.    All other systems reviewed and are negative.  EKGs/Labs/Other Studies Reviewed:    The following studies were reviewed today: IMPRESSIONS     1. Left ventricular ejection fraction, by estimation, is 25 to 30%. The  left ventricle has severely decreased function. The left ventricle  demonstrates regional wall motion abnormalities (see scoring  diagram/findings for description). Left ventricular  diastolic parameters are consistent with Grade I diastolic dysfunction  (impaired relaxation). There is moderate akinesis of the left ventricular,  entire anterior wall and anteroseptal wall. There is moderate akinesis of  the left ventricular, entire  apical segment.   2. Right ventricular systolic function is normal. The right ventricular  size is normal.   3. The mitral valve is normal in structure. No evidence of mitral valve  regurgitation. No evidence of mitral stenosis.   4. The aortic valve is  normal in structure. Aortic valve regurgitation is  not visualized. No aortic stenosis is present.   5. The inferior vena cava is normal in size with greater than 50%  respiratory variability, suggesting right atrial pressure of 3 mmHg.    Recent Labs: 12/27/2021: ALT 17; BUN 15; Creatinine, Ser 1.20; Hemoglobin 14.0; Platelets 238; Potassium 4.9; Sodium 146  Recent Lipid Panel    Component Value Date/Time   CHOL 145 12/27/2021 0856   TRIG 75 12/27/2021 0856   HDL 44 12/27/2021 0856   CHOLHDL 3.3 12/27/2021 0856   LDLCALC 86 12/27/2021 0856    Physical Exam:    VS:  BP 118/60   Pulse 72   Ht $R'5\' 7"'oW$  (1.702 m)   Wt 208 lb 3.2 oz (94.4 kg)   SpO2 97%   BMI 32.61 kg/m     Wt Readings from Last 3 Encounters:  03/16/22 208 lb 3.2 oz (94.4 kg)  03/02/22 207 lb (93.9 kg)  01/13/22 210 lb (95.3 kg)     GEN: Patient is in no acute distress HEENT: Normal NECK: No JVD; No carotid bruits LYMPHATICS: No lymphadenopathy CARDIAC: Hear sounds regular, 2/6 systolic murmur at the apex. RESPIRATORY:  Clear to auscultation without rales, wheezing or rhonchi  ABDOMEN: Soft, non-tender, non-distended MUSCULOSKELETAL:  No edema; No deformity  SKIN: Warm and dry NEUROLOGIC:  Alert and oriented x 3 PSYCHIATRIC:  Normal affect   Signed, Jenean Lindau, MD  03/16/2022 2:00 PM    [place]

## 2022-03-17 LAB — CBC
Hematocrit: 40.1 % (ref 37.5–51.0)
Hemoglobin: 13.6 g/dL (ref 13.0–17.7)
MCH: 31.6 pg (ref 26.6–33.0)
MCHC: 33.9 g/dL (ref 31.5–35.7)
MCV: 93 fL (ref 79–97)
Platelets: 186 10*3/uL (ref 150–450)
RBC: 4.3 x10E6/uL (ref 4.14–5.80)
RDW: 14.6 % (ref 11.6–15.4)
WBC: 4.6 10*3/uL (ref 3.4–10.8)

## 2022-03-17 LAB — BASIC METABOLIC PANEL
BUN/Creatinine Ratio: 10 (ref 10–24)
BUN: 12 mg/dL (ref 8–27)
CO2: 24 mmol/L (ref 20–29)
Calcium: 9.7 mg/dL (ref 8.6–10.2)
Chloride: 107 mmol/L — ABNORMAL HIGH (ref 96–106)
Creatinine, Ser: 1.21 mg/dL (ref 0.76–1.27)
Glucose: 84 mg/dL (ref 70–99)
Potassium: 4.7 mmol/L (ref 3.5–5.2)
Sodium: 144 mmol/L (ref 134–144)
eGFR: 62 mL/min/{1.73_m2} (ref >59)

## 2022-03-23 ENCOUNTER — Encounter (HOSPITAL_COMMUNITY)
Admission: RE | Disposition: A | Discharge status: Home / Self Care | Payer: Self-pay | Source: Home / Self Care | Attending: Cardiology

## 2022-03-23 ENCOUNTER — Encounter (HOSPITAL_COMMUNITY): Payer: Self-pay | Admitting: Cardiology

## 2022-03-23 ENCOUNTER — Other Ambulatory Visit: Payer: Self-pay

## 2022-03-23 ENCOUNTER — Ambulatory Visit (HOSPITAL_COMMUNITY)
Admission: RE | Admit: 2022-03-23 | Discharge: 2022-03-23 | Disposition: A | Discharge status: Home / Self Care | Payer: Medicare HMO | Attending: Cardiology | Admitting: Cardiology

## 2022-03-23 DIAGNOSIS — I5022 Chronic systolic (congestive) heart failure: Secondary | ICD-10-CM | POA: Diagnosis not present

## 2022-03-23 DIAGNOSIS — I252 Old myocardial infarction: Secondary | ICD-10-CM | POA: Insufficient documentation

## 2022-03-23 DIAGNOSIS — I429 Cardiomyopathy, unspecified: Secondary | ICD-10-CM | POA: Insufficient documentation

## 2022-03-23 DIAGNOSIS — I2510 Atherosclerotic heart disease of native coronary artery without angina pectoris: Secondary | ICD-10-CM | POA: Insufficient documentation

## 2022-03-23 DIAGNOSIS — R0609 Other forms of dyspnea: Secondary | ICD-10-CM | POA: Diagnosis not present

## 2022-03-23 DIAGNOSIS — E1122 Type 2 diabetes mellitus with diabetic chronic kidney disease: Secondary | ICD-10-CM | POA: Insufficient documentation

## 2022-03-23 DIAGNOSIS — I10 Essential (primary) hypertension: Secondary | ICD-10-CM

## 2022-03-23 DIAGNOSIS — Z7984 Long term (current) use of oral hypoglycemic drugs: Secondary | ICD-10-CM | POA: Insufficient documentation

## 2022-03-23 DIAGNOSIS — N1831 Chronic kidney disease, stage 3a: Secondary | ICD-10-CM | POA: Insufficient documentation

## 2022-03-23 DIAGNOSIS — I5020 Unspecified systolic (congestive) heart failure: Secondary | ICD-10-CM

## 2022-03-23 DIAGNOSIS — E782 Mixed hyperlipidemia: Secondary | ICD-10-CM | POA: Diagnosis not present

## 2022-03-23 DIAGNOSIS — Z955 Presence of coronary angioplasty implant and graft: Secondary | ICD-10-CM | POA: Insufficient documentation

## 2022-03-23 DIAGNOSIS — I130 Hypertensive heart and chronic kidney disease with heart failure and stage 1 through stage 4 chronic kidney disease, or unspecified chronic kidney disease: Secondary | ICD-10-CM | POA: Insufficient documentation

## 2022-03-23 DIAGNOSIS — E1159 Type 2 diabetes mellitus with other circulatory complications: Secondary | ICD-10-CM

## 2022-03-23 DIAGNOSIS — E1121 Type 2 diabetes mellitus with diabetic nephropathy: Secondary | ICD-10-CM

## 2022-03-23 HISTORY — PX: LEFT HEART CATH AND CORONARY ANGIOGRAPHY: CATH118249

## 2022-03-23 LAB — GLUCOSE, CAPILLARY: Glucose-Capillary: 101 mg/dL — ABNORMAL HIGH (ref 70–99)

## 2022-03-23 SURGERY — LEFT HEART CATH AND CORONARY ANGIOGRAPHY
Anesthesia: LOCAL

## 2022-03-23 MED ORDER — SODIUM CHLORIDE 0.9% FLUSH: 3.0000 mL | INTRAVENOUS | Status: DC | PRN

## 2022-03-23 MED ORDER — SODIUM CHLORIDE 0.9% FLUSH: 3.0000 mL | Freq: Two times a day (BID) | INTRAVENOUS | Status: DC

## 2022-03-23 MED ORDER — HEPARIN (PORCINE) IN NACL 1000-0.9 UT/500ML-% IV SOLN
INTRAVENOUS | Status: AC
  Filled 2022-03-23: qty 1000

## 2022-03-23 MED ORDER — SODIUM CHLORIDE 0.9 % WEIGHT BASED INFUSION
3.0000 mL/kg/h | INTRAVENOUS | Status: AC
  Administered 2022-03-23: 3 mL/kg/h via INTRAVENOUS

## 2022-03-23 MED ORDER — HEPARIN (PORCINE) IN NACL 1000-0.9 UT/500ML-% IV SOLN
INTRAVENOUS | Status: DC | PRN
  Administered 2022-03-23 (×2): 500 mL

## 2022-03-23 MED ORDER — ASPIRIN 81 MG PO CHEW: 81.0000 mg | CHEWABLE_TABLET | ORAL | Status: DC

## 2022-03-23 MED ORDER — HEPARIN SODIUM (PORCINE) 1000 UNIT/ML IJ SOLN
INTRAMUSCULAR | Status: DC | PRN
  Administered 2022-03-23: 5000 [IU] via INTRAVENOUS

## 2022-03-23 MED ORDER — MIDAZOLAM HCL 2 MG/2ML IJ SOLN
INTRAMUSCULAR | Status: DC | PRN
  Administered 2022-03-23: 1 mg via INTRAVENOUS

## 2022-03-23 MED ORDER — FENTANYL CITRATE (PF) 100 MCG/2ML IJ SOLN
INTRAMUSCULAR | Status: DC | PRN
  Administered 2022-03-23: 25 ug via INTRAVENOUS

## 2022-03-23 MED ORDER — IOHEXOL 350 MG/ML SOLN
INTRAVENOUS | Status: DC | PRN
  Administered 2022-03-23: 70 mL

## 2022-03-23 MED ORDER — LIDOCAINE HCL (PF) 1 % IJ SOLN
INTRAMUSCULAR | Status: DC | PRN
  Administered 2022-03-23: 2 mL

## 2022-03-23 MED ORDER — METFORMIN HCL 500 MG PO TABS: 500.0000 mg | ORAL_TABLET | Freq: Every morning | ORAL | Status: DC

## 2022-03-23 MED ORDER — MIDAZOLAM HCL 2 MG/2ML IJ SOLN
INTRAMUSCULAR | Status: AC
  Filled 2022-03-23: qty 2

## 2022-03-23 MED ORDER — SODIUM CHLORIDE 0.9 % WEIGHT BASED INFUSION: 1.0000 mL/kg/h | INTRAVENOUS | Status: DC

## 2022-03-23 MED ORDER — HEPARIN SODIUM (PORCINE) 1000 UNIT/ML IJ SOLN
INTRAMUSCULAR | Status: AC
  Filled 2022-03-23: qty 10

## 2022-03-23 MED ORDER — VERAPAMIL HCL 2.5 MG/ML IV SOLN
INTRAVENOUS | Status: AC
  Filled 2022-03-23: qty 2

## 2022-03-23 MED ORDER — VERAPAMIL HCL 2.5 MG/ML IV SOLN
INTRAVENOUS | Status: DC | PRN
  Administered 2022-03-23: 10 mL via INTRA_ARTERIAL

## 2022-03-23 MED ORDER — SODIUM CHLORIDE 0.9 % IV SOLN: 250.0000 mL | INTRAVENOUS | Status: DC | PRN

## 2022-03-23 MED ORDER — FENTANYL CITRATE (PF) 100 MCG/2ML IJ SOLN
INTRAMUSCULAR | Status: AC
  Filled 2022-03-23: qty 2

## 2022-03-23 MED ORDER — LIDOCAINE HCL (PF) 1 % IJ SOLN
INTRAMUSCULAR | Status: AC
  Filled 2022-03-23: qty 30

## 2022-03-23 SURGICAL SUPPLY — 9 items
BAND ZEPHYR COMPRESS 30 LONG (HEMOSTASIS) ×1 IMPLANT
CATH 5FR JL3.5 JR4 ANG PIG MP (CATHETERS) ×1 IMPLANT
GLIDESHEATH SLEND SS 6F .021 (SHEATH) ×1 IMPLANT
GUIDEWIRE INQWIRE 1.5J.035X260 (WIRE) IMPLANT
INQWIRE 1.5J .035X260CM (WIRE) ×2
KIT HEART LEFT (KITS) ×2 IMPLANT
PACK CARDIAC CATHETERIZATION (CUSTOM PROCEDURE TRAY) ×2 IMPLANT
TRANSDUCER W/STOPCOCK (MISCELLANEOUS) ×2 IMPLANT
TUBING CIL FLEX 10 FLL-RA (TUBING) ×2 IMPLANT

## 2022-03-23 NOTE — Interval H&P Note (Signed)
History and Physical Interval Note:  03/23/2022 8:25 AM  Mark [name]  has presented today for surgery, with the diagnosis of cardiomyopathy.  The various methods of treatment have been discussed with the patient and family. After consideration of risks, benefits and other options for treatment, the patient has consented to  Procedure(s): LEFT HEART CATH AND CORONARY ANGIOGRAPHY (N/A) as a surgical intervention.  The patient's history has been reviewed, patient examined, no change in status, stable for surgery.  I have reviewed the patient's chart and labs.  Questions were answered to the patient's satisfaction.    Cath Lab Visit (complete for each Cath Lab visit)  Clinical Evaluation Leading to the Procedure:   ACS: No.  Non-ACS:    Anginal Classification: CCS II  Anti-ischemic medical therapy: Minimal Therapy (1 class of medications)  Non-Invasive Test Results: High-risk stress test findings: cardiac mortality >3%/year  Prior CABG: No previous CABG       Mark [name], [facility] 03/23/2022 8:25 AM

## 2022-04-06 ENCOUNTER — Other Ambulatory Visit: Payer: Self-pay | Admitting: Legal Medicine

## 2022-04-20 ENCOUNTER — Ambulatory Visit: Payer: Medicare HMO | Attending: Cardiology | Admitting: Cardiology

## 2022-04-22 ENCOUNTER — Other Ambulatory Visit: Payer: Self-pay | Admitting: Legal Medicine

## 2022-05-01 NOTE — Progress Notes (Unsigned)
Subjective:  Patient ID: Mark [name], male    DOB: Aug 13, 1946  Age: 76 y.o. MRN: [id number]  No chief complaint on file.   HPI Patient present with type 2 diabetes.  Specifically, this is type 2, {insulin, noninsulin} requiring diabetes, complicated by { }.  Compliance with treatment has been good; patient take medicines as directed, maintains diet and exercise regimen, follows up as directed, and is keeping glucose diary.  Current medicines for diabetes metformin 500 mg every morning.  Patient performs foot exams daily and last ophthalmologic exam was { }.   Patient presents with hyperlipidemia.  Compliance with treatment has been good; patient takes medicines as directed, maintains low cholesterol diet, follows up as directed, and maintains exercise regimen.  Patient is using Atorvastatin 20 mg daily without problems.    Patient presents for follow up of hypertension.  Patient tolerating Carvedilol 6.25 mg  every day well without side effects.  Patient is working on maintaining diet and exercise regimen and follows up as directed. Complication include Diabetes, Hyperlipidemia. Current Outpatient Medications on File Prior to Visit  Medication Sig Dispense Refill   albuterol (VENTOLIN HFA) 108 (90 Base) MCG/ACT inhaler Inhale 1-2 puffs into the lungs as needed for wheezing or shortness of breath.     Alcohol Swabs (DROPSAFE ALCOHOL PREP) 70 % PADS USE AS DIRECTED TWICE DAILY 200 each 3   aspirin 81 MG chewable tablet Chew 81 mg by mouth daily.     atorvastatin (LIPITOR) 20 MG tablet TAKE 1 TABLET EVERY DAY 90 tablet 2   Blood Glucose Calibration (TRUE METRIX LEVEL 1) Low SOLN 1 each by In Vitro route every 30 (thirty) days. 1 each 2   Blood Glucose Monitoring Suppl (TRUE METRIX AIR GLUCOSE METER) w/Device KIT 1 each by Does not apply route in the morning and at bedtime. 1 kit 0   carvedilol (COREG) 6.25 MG tablet TAKE 1 TABLET EVERY DAY 90 tablet 1   cyclobenzaprine (FLEXERIL) 5 MG tablet  Take 1 tablet (5 mg total) by mouth at bedtime. 10 tablet 0   furosemide (LASIX) 20 MG tablet TAKE 1 TABLET EVERY OTHER DAY 45 tablet 1   Magnesium 500 MG TABS Take 500 mg by mouth in the morning.     metFORMIN (GLUCOPHAGE) 500 MG tablet Take 1 tablet (500 mg total) by mouth in the morning.     nitroGLYCERIN (NITROSTAT) 0.4 MG SL tablet DISSOLVE ONE TABLET UNDER TONGUE AS NEEDED FOR CHEST PAIN UP TO 3 TABLETS 25 tablet 11   spironolactone (ALDACTONE) 25 MG tablet TAKE 1/2 TABLET EVERY DAY 45 tablet 1   TRUE METRIX BLOOD GLUCOSE TEST test strip TEST BLOOD SUGAR IN THE MORNING AND AT BEDTIME AS INSTRUCTED 200 strip 3   TRUEplus Lancets 30G MISC TEST BLOOD SUGAR IN THE MORNING AND AT BEDTIME. 200 each 3   No current facility-administered medications on file prior to visit.   Past Medical History:  Diagnosis Date   Abnormal EKG 03/02/2022   Acute renal failure superimposed on stage 3a chronic kidney disease ([place]) 12/08/2020   Arthritis    Atherosclerotic heart disease of native coronary artery with angina pectoris ([place]) 07/20/2017   BMI 32.0-32.9,adult 03/29/2020   Cardiac murmur 03/02/2022   Cardiomyopathy ([place]) 03/16/2022   Cervical spondylosis 01/11/2022   Diabetic glomerulopathy ([place]) 11/27/2019   ED (erectile dysfunction) 11/27/2019   Essential hypertension    HFrEF (heart failure with reduced ejection fraction) ([place]) 03/16/2022   History of BPH  Hyperlipidemia 07/20/2017   LBBB (left bundle branch block) 12/27/2021   Osteoarthritis of right knee 12/08/2020   Peripheral vascular disease ([place])    Type 2 diabetes mellitus with vascular disease ([place])    Past Surgical History:  Procedure Laterality Date   HERNIA REPAIR     INGUINAL HERNIA REPAIR Bilateral    LEFT HEART CATH AND CORONARY ANGIOGRAPHY N/A 03/23/2022   Procedure: LEFT HEART CATH AND CORONARY ANGIOGRAPHY;  Surgeon: Martin[place], Peter M, MD;  Location: [facility];  Service: Cardiovascular;  Laterality: N/A;    Family  History  Problem Relation Age of Onset   Cancer Mother    Hypertension Neg Hx    Heart disease Neg Hx    Diabetes Neg Hx    Social History   Socioeconomic History   Marital status: Single    Spouse name: Not on file   Number of children: 4   Years of education: 12   Highest education level: 12th grade  Occupational History   Occupation: retired   Tobacco Use   Smoking status: Never   Smokeless tobacco: Never  Vaping Use   Vaping Use: Never used  Substance and Sexual Activity   Alcohol use: No   Drug use: No   Sexual activity: Not Currently  Other Topics Concern   Not on file  Social History Narrative   Lives in a hotel and feels safe with his living arrangements.    Social Determinants of Health   Financial Resource Strain: Low Risk  (07/21/2021)   Overall Financial Resource Strain (CARDIA)    Difficulty of Paying Living Expenses: Not very hard  Food Insecurity: No Food Insecurity (07/21/2021)   Hunger Vital Sign    Worried About Running Out of Food in the Last Year: Never true    Ran Out of Food in the Last Year: Never true  Transportation Needs: No Transportation Needs (07/21/2021)   PRAPARE - Hydrologist (Medical): No    Lack of Transportation (Non-Medical): No  Physical Activity: Sufficiently Active (07/21/2021)   Exercise Vital Sign    Days of Exercise per Week: 7 days    Minutes of Exercise per Session: 60 min  Stress: No Stress Concern Present (07/21/2021)   [place]    Feeling of Stress : Not at all  Social Connections: Socially Isolated (07/21/2021)   Social Connection and Isolation Panel [NHANES]    Frequency of Communication with Friends and Family: More than three times a week    Frequency of Social Gatherings with Friends and Family: Never    Attends Religious Services: Never    Marine scientist or Organizations: No    Attends Archivist  Meetings: Never    Marital Status: Never married    Review of Systems   Objective:  There were no vitals taken for this visit.     03/23/2022   10:22 AM 03/23/2022    9:52 AM 03/23/2022    9:20 AM  BP/Weight  Systolic BP 037 048 889  Diastolic BP 65 72 72    Physical Exam  Diabetic Foot Exam - Simple   No data filed      Lab Results  Component Value Date   WBC 4.6 03/16/2022   HGB 13.6 03/16/2022   HCT 40.1 03/16/2022   PLT 186 03/16/2022   GLUCOSE 84 03/16/2022   CHOL 145 12/27/2021   TRIG 75 12/27/2021  HDL 44 12/27/2021   LDLCALC 86 12/27/2021   ALT 17 12/27/2021   AST 18 12/27/2021   NA 144 03/16/2022   K 4.7 03/16/2022   CL 107 (H) 03/16/2022   CREATININE 1.21 03/16/2022   BUN 12 03/16/2022   CO2 24 03/16/2022   HGBA1C 6.7 (H) 12/27/2021   MICROALBUR 10 04/07/2021      Assessment & Plan:   Problem List Items Addressed This Visit       Cardiovascular and Mediastinum   Essential hypertension - Primary   Atherosclerotic heart disease of native coronary artery with angina pectoris ([place])   Type 2 diabetes mellitus with vascular disease ([place])     Endocrine   Diabetic glomerulopathy ([place])     Other   Hyperlipidemia   ED (erectile dysfunction)  .  No orders of the defined types were placed in this encounter.   No orders of the defined types were placed in this encounter.    Follow-up: No follow-ups on file.  An After Visit Summary was printed and given to the patient.  Reinaldo Meeker, MD [facility] [phone]

## 2022-05-02 ENCOUNTER — Ambulatory Visit (INDEPENDENT_AMBULATORY_CARE_PROVIDER_SITE_OTHER): Payer: Medicare HMO | Admitting: Legal Medicine

## 2022-05-02 ENCOUNTER — Encounter: Payer: Self-pay | Admitting: Legal Medicine

## 2022-05-02 VITALS — BP 120/64 | HR 54 | Temp 97.4°F | Resp 15 | Ht 67.0 in | Wt 201.0 lb

## 2022-05-02 DIAGNOSIS — N529 Male erectile dysfunction, unspecified: Secondary | ICD-10-CM

## 2022-05-02 DIAGNOSIS — E782 Mixed hyperlipidemia: Secondary | ICD-10-CM

## 2022-05-02 DIAGNOSIS — H43391 Other vitreous opacities, right eye: Secondary | ICD-10-CM | POA: Diagnosis not present

## 2022-05-02 DIAGNOSIS — H43399 Other vitreous opacities, unspecified eye: Secondary | ICD-10-CM

## 2022-05-02 DIAGNOSIS — Z23 Encounter for immunization: Secondary | ICD-10-CM

## 2022-05-02 DIAGNOSIS — E1121 Type 2 diabetes mellitus with diabetic nephropathy: Secondary | ICD-10-CM | POA: Diagnosis not present

## 2022-05-02 DIAGNOSIS — I25119 Atherosclerotic heart disease of native coronary artery with unspecified angina pectoris: Secondary | ICD-10-CM | POA: Diagnosis not present

## 2022-05-02 DIAGNOSIS — E1159 Type 2 diabetes mellitus with other circulatory complications: Secondary | ICD-10-CM | POA: Diagnosis not present

## 2022-05-02 DIAGNOSIS — Z6831 Body mass index (BMI) 31.0-31.9, adult: Secondary | ICD-10-CM | POA: Diagnosis not present

## 2022-05-02 DIAGNOSIS — I10 Essential (primary) hypertension: Secondary | ICD-10-CM

## 2022-05-02 DIAGNOSIS — I5020 Unspecified systolic (congestive) heart failure: Secondary | ICD-10-CM

## 2022-05-02 HISTORY — DX: Other vitreous opacities, unspecified eye: H43.399

## 2022-05-02 MED ORDER — NITROGLYCERIN 0.4 MG SL SUBL: SUBLINGUAL_TABLET | SUBLINGUAL | 11 refills | Status: AC

## 2022-05-03 ENCOUNTER — Other Ambulatory Visit: Payer: Medicare HMO

## 2022-05-03 DIAGNOSIS — E119 Type 2 diabetes mellitus without complications: Secondary | ICD-10-CM | POA: Diagnosis not present

## 2022-05-03 DIAGNOSIS — H2513 Age-related nuclear cataract, bilateral: Secondary | ICD-10-CM | POA: Diagnosis not present

## 2022-05-03 LAB — MICROALBUMIN / CREATININE URINE RATIO
Creatinine, Urine: 79.7 mg/dL
Microalb/Creat Ratio: 17 mg/g creat (ref 0–29)
Microalbumin, Urine: 13.6 ug/mL

## 2022-05-03 LAB — HM DIABETES EYE EXAM

## 2022-05-03 NOTE — Progress Notes (Signed)
Normal microalbuminuris lp

## 2022-05-08 ENCOUNTER — Other Ambulatory Visit: Payer: Medicare HMO

## 2022-05-08 DIAGNOSIS — E1121 Type 2 diabetes mellitus with diabetic nephropathy: Secondary | ICD-10-CM | POA: Diagnosis not present

## 2022-05-08 DIAGNOSIS — E782 Mixed hyperlipidemia: Secondary | ICD-10-CM | POA: Diagnosis not present

## 2022-05-08 DIAGNOSIS — I10 Essential (primary) hypertension: Secondary | ICD-10-CM | POA: Diagnosis not present

## 2022-05-09 LAB — CBC WITH DIFFERENTIAL/PLATELET
Basophils Absolute: 0 10*3/uL (ref 0.0–0.2)
Basos: 0 %
EOS (ABSOLUTE): 0.1 10*3/uL (ref 0.0–0.4)
Eos: 3 %
Hematocrit: 38.1 % (ref 37.5–51.0)
Hemoglobin: 12.9 g/dL — ABNORMAL LOW (ref 13.0–17.7)
Immature Grans (Abs): 0 10*3/uL (ref 0.0–0.1)
Immature Granulocytes: 0 %
Lymphocytes Absolute: 2.3 10*3/uL (ref 0.7–3.1)
Lymphs: 53 %
MCH: 31.3 pg (ref 26.6–33.0)
MCHC: 33.9 g/dL (ref 31.5–35.7)
MCV: 93 fL (ref 79–97)
Monocytes Absolute: 0.5 10*3/uL (ref 0.1–0.9)
Monocytes: 11 %
Neutrophils Absolute: 1.5 10*3/uL (ref 1.4–7.0)
Neutrophils: 33 %
Platelets: 235 10*3/uL (ref 150–450)
RBC: 4.12 x10E6/uL — ABNORMAL LOW (ref 4.14–5.80)
RDW: 13.7 % (ref 11.6–15.4)
WBC: 4.5 10*3/uL (ref 3.4–10.8)

## 2022-05-09 LAB — COMPREHENSIVE METABOLIC PANEL
ALT: 19 IU/L (ref 0–44)
AST: 22 IU/L (ref 0–40)
Albumin/Globulin Ratio: 1.6 (ref 1.2–2.2)
Albumin: 4 g/dL (ref 3.8–4.8)
Alkaline Phosphatase: 63 IU/L (ref 44–121)
BUN/Creatinine Ratio: 12 (ref 10–24)
BUN: 13 mg/dL (ref 8–27)
Bilirubin Total: 0.5 mg/dL (ref 0.0–1.2)
CO2: 19 mmol/L — ABNORMAL LOW (ref 20–29)
Calcium: 9.6 mg/dL (ref 8.6–10.2)
Chloride: 111 mmol/L — ABNORMAL HIGH (ref 96–106)
Creatinine, Ser: 1.05 mg/dL (ref 0.76–1.27)
Globulin, Total: 2.5 g/dL (ref 1.5–4.5)
Glucose: 78 mg/dL (ref 70–99)
Potassium: 4.4 mmol/L (ref 3.5–5.2)
Sodium: 145 mmol/L — ABNORMAL HIGH (ref 134–144)
Total Protein: 6.5 g/dL (ref 6.0–8.5)
eGFR: 74 mL/min/{1.73_m2} (ref >59)

## 2022-05-09 LAB — HEMOGLOBIN A1C
Est. average glucose Bld gHb Est-mCnc: 143 mg/dL
Hgb A1c MFr Bld: 6.6 % — ABNORMAL HIGH (ref 4.8–5.6)

## 2022-05-09 LAB — LIPID PANEL
Chol/HDL Ratio: 2.6 ratio (ref 0.0–5.0)
Cholesterol, Total: 128 mg/dL (ref 100–199)
HDL: 49 mg/dL (ref >39)
LDL Chol Calc (NIH): 66 mg/dL (ref 0–99)
Triglycerides: 60 mg/dL (ref 0–149)
VLDL Cholesterol Cal: 13 mg/dL (ref 5–40)

## 2022-05-09 LAB — CARDIOVASCULAR RISK ASSESSMENT

## 2022-05-23 ENCOUNTER — Encounter: Payer: Self-pay | Admitting: Nurse Practitioner

## 2022-07-02 IMAGING — CR DG CERVICAL SPINE COMPLETE 4+V
6 series · 6 of 6 positions shown · non-contrast
Comparison: None Available.

CLINICAL DATA: Awoke with neck pain.  No specific trauma.

EXAM:
CERVICAL SPINE - COMPLETE 4+ VIEW

[c-spine lat]
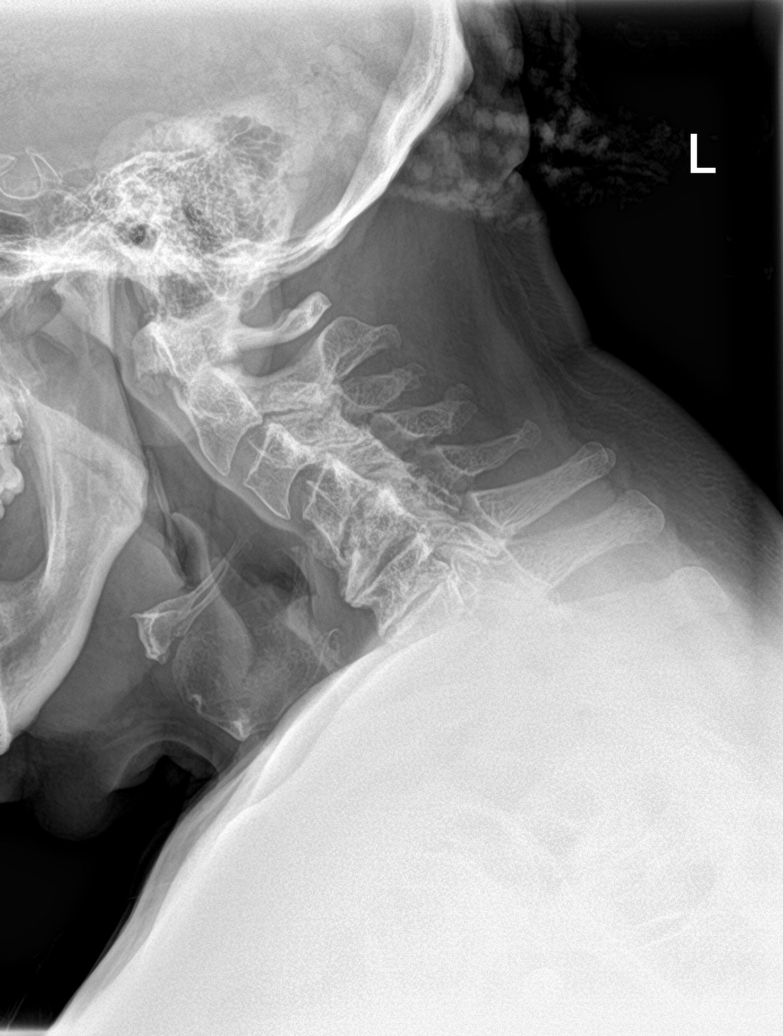

[c-spine obl (1 of 2)]
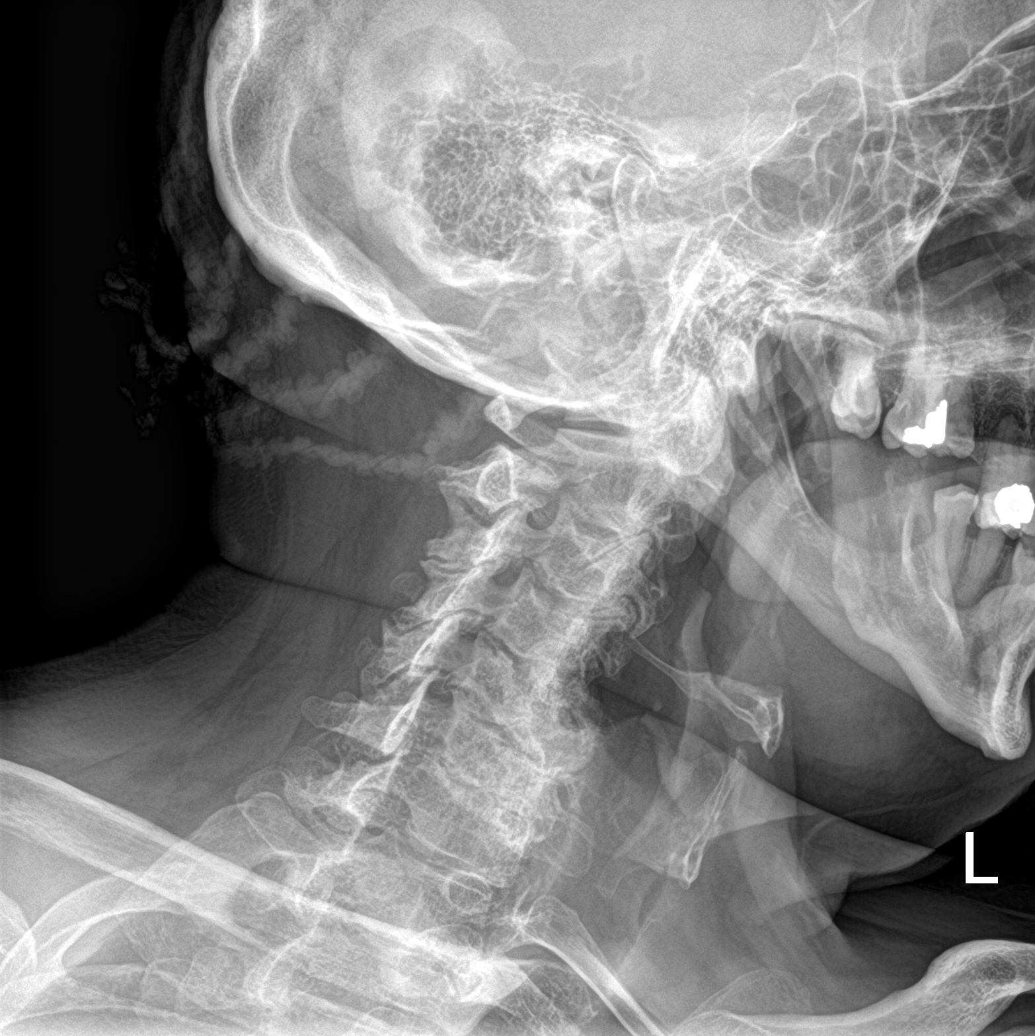

[c-spine ap]
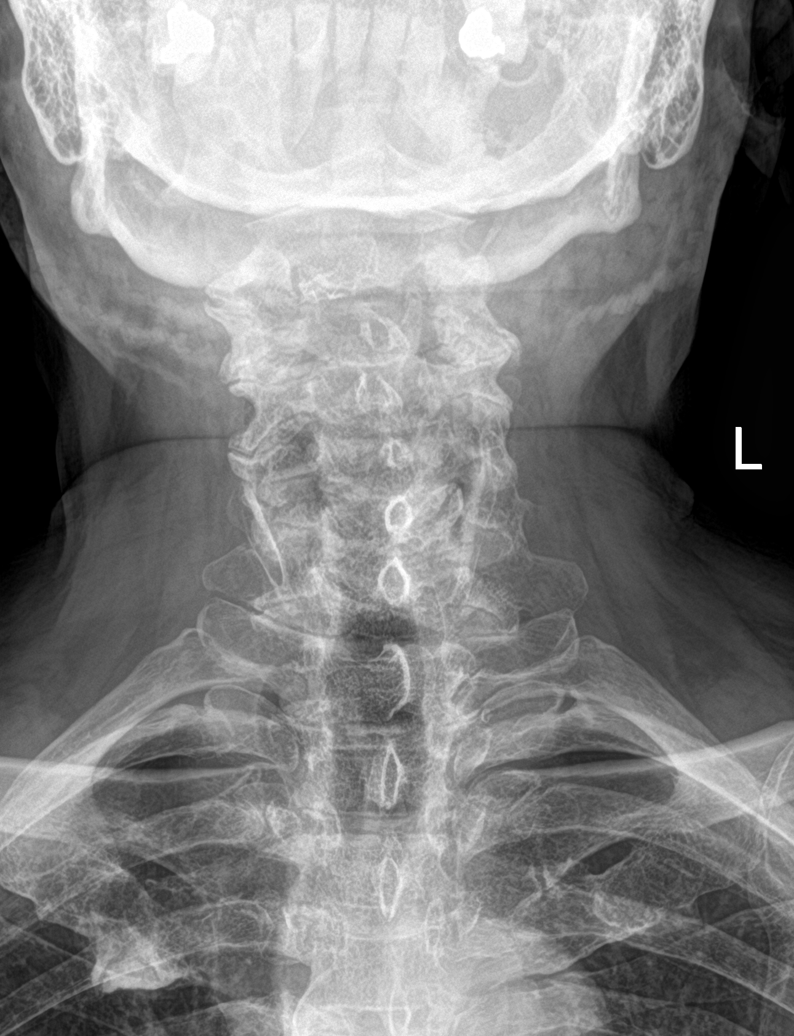

[c-spine open mouth]
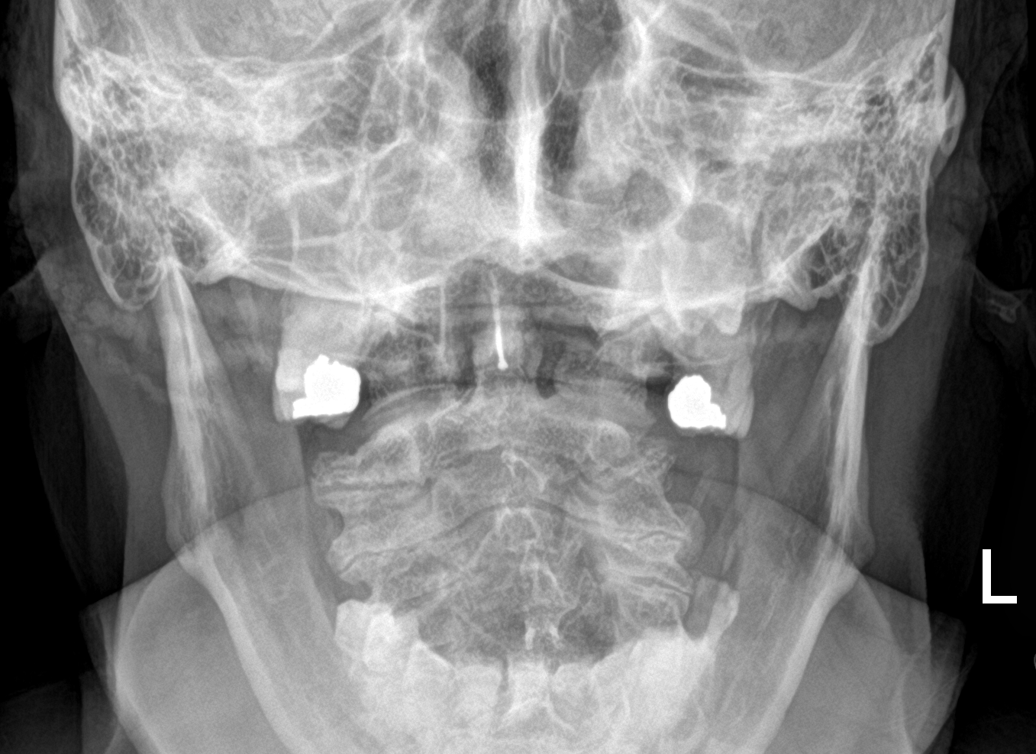

[c-spine swimmers]
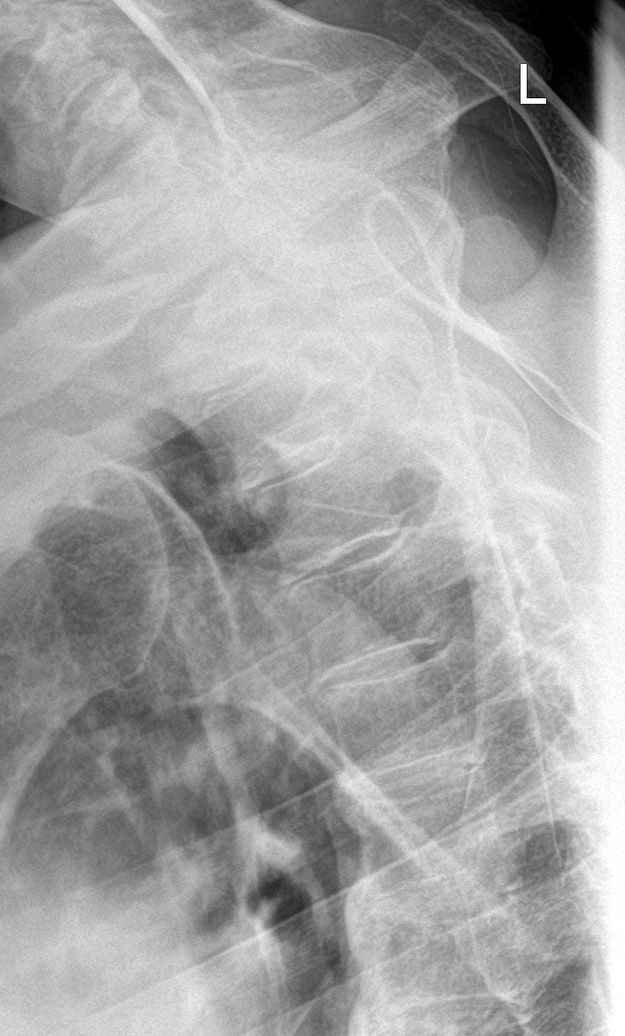

[c-spine obl (2 of 2)]
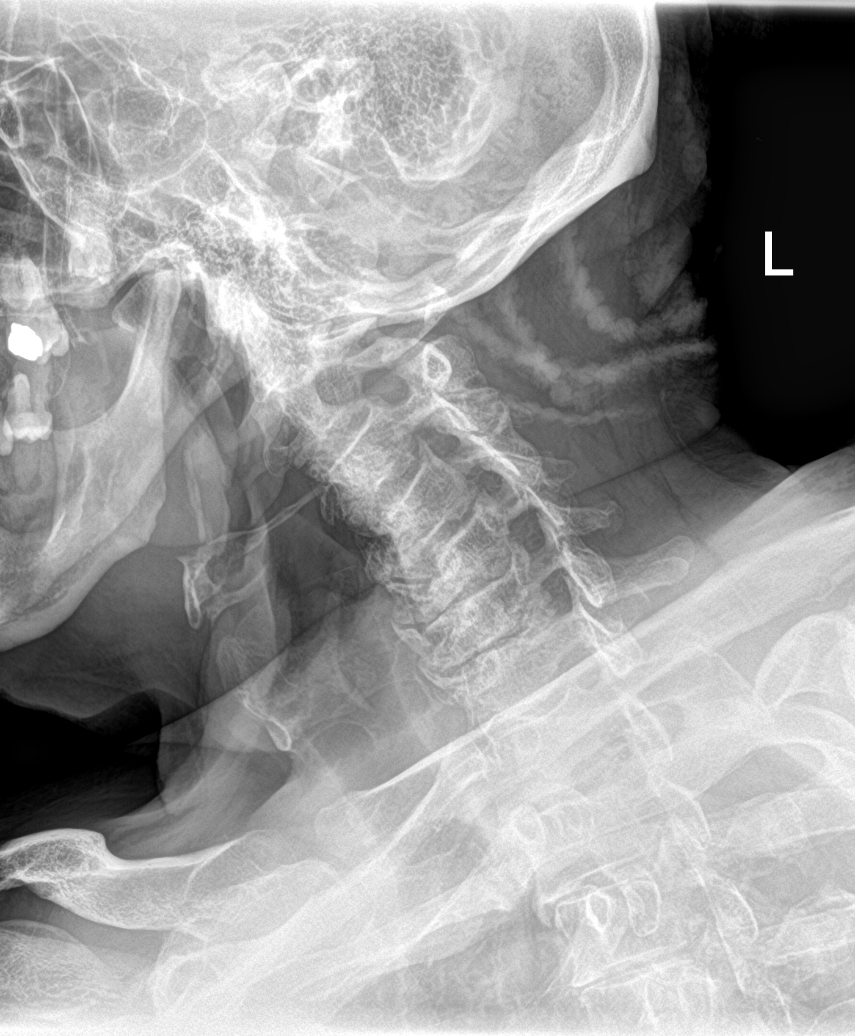

[6 of 6 positions shown; findings below may reference images not displayed]

FINDINGS: No fracture, bone lesion or spondylolisthesis.

Focal kyphosis, apex at C5.

Moderate loss of disc height at C4-C5, C5-C6 and C6-C7 with
associated anterior endplate spurring.

No significant neural foraminal narrowing.

Bilateral facet degenerative changes most evident at L2-L3, 3-L4 and
L4-L5.

Soft tissues are unremarkable.
IMPRESSION: 1. No fracture or acute finding.
2. Degenerative changes as detailed.

## 2022-08-01 ENCOUNTER — Ambulatory Visit (INDEPENDENT_AMBULATORY_CARE_PROVIDER_SITE_OTHER): Payer: Medicare HMO | Admitting: Nurse Practitioner

## 2022-08-01 ENCOUNTER — Encounter: Payer: Self-pay | Admitting: Nurse Practitioner

## 2022-08-01 VITALS — BP 130/76 | HR 62 | Temp 97.1°F | Ht 67.0 in

## 2022-08-01 DIAGNOSIS — I209 Angina pectoris, unspecified: Secondary | ICD-10-CM | POA: Diagnosis not present

## 2022-08-01 DIAGNOSIS — E785 Hyperlipidemia, unspecified: Secondary | ICD-10-CM | POA: Diagnosis not present

## 2022-08-01 DIAGNOSIS — E669 Obesity, unspecified: Secondary | ICD-10-CM | POA: Diagnosis not present

## 2022-08-01 DIAGNOSIS — R0609 Other forms of dyspnea: Secondary | ICD-10-CM | POA: Diagnosis not present

## 2022-08-01 DIAGNOSIS — Z7982 Long term (current) use of aspirin: Secondary | ICD-10-CM | POA: Diagnosis not present

## 2022-08-01 DIAGNOSIS — R0789 Other chest pain: Secondary | ICD-10-CM | POA: Diagnosis not present

## 2022-08-01 DIAGNOSIS — I10 Essential (primary) hypertension: Secondary | ICD-10-CM | POA: Diagnosis not present

## 2022-08-01 DIAGNOSIS — Z79899 Other long term (current) drug therapy: Secondary | ICD-10-CM | POA: Diagnosis not present

## 2022-08-01 DIAGNOSIS — I2510 Atherosclerotic heart disease of native coronary artery without angina pectoris: Secondary | ICD-10-CM | POA: Diagnosis not present

## 2022-08-01 DIAGNOSIS — E1169 Type 2 diabetes mellitus with other specified complication: Secondary | ICD-10-CM | POA: Diagnosis not present

## 2022-08-01 DIAGNOSIS — R079 Chest pain, unspecified: Secondary | ICD-10-CM | POA: Diagnosis not present

## 2022-08-01 MED ORDER — ASPIRIN 81 MG PO CHEW
324.0000 mg | CHEWABLE_TABLET | Freq: Once | ORAL | Status: AC
  Administered 2022-08-01: 324 mg via ORAL

## 2022-08-01 MED ORDER — NITROGLYCERIN 0.4 MG SL SUBL
0.4000 mg | SUBLINGUAL_TABLET | Freq: Once | SUBLINGUAL | Status: AC
  Administered 2022-08-01: 0.4 mg via SUBLINGUAL

## 2022-08-01 NOTE — Progress Notes (Signed)
Subjective:  Patient ID: Mark [name], male    DOB: [birth year]  Age: 76 y.o. MRN: [id number]  Chief Complaint  Patient presents with   Chest Pain      Pain Pt walked into office lobby without an appt. He reports new onset chest pain and dyspnea. Onset was 2 days ago and is staying constant. The pain does radiate back. The pain is described as unable to describe, is moderate in intensity, occurring constantly. Symptoms are worse in the: all day. Patient also reports falling yesterday off a chair. Denied nausea, jaw, or arm pain. Pt has a history of CHF with reduced EF and MI.   Nitroglycerin given at 8:06 a.m. 4 ASA 81 mg given at 8:06 a.m. 2L 02 started at 8:06 8:11 chest pain subsided 8:18 Ems arrived  Patient called Mark [name] (friend) to pick up personal belongings/keys/car [phone]   Current Outpatient Medications on File Prior to Visit  Medication Sig Dispense Refill   albuterol (VENTOLIN HFA) 108 (90 Base) MCG/ACT inhaler Inhale 1-2 puffs into the lungs as needed for wheezing or shortness of breath.     Alcohol Swabs (DROPSAFE ALCOHOL PREP) 70 % PADS USE AS DIRECTED TWICE DAILY 200 each 3   aspirin 81 MG chewable tablet Chew 81 mg by mouth daily.     atorvastatin (LIPITOR) 20 MG tablet TAKE 1 TABLET EVERY DAY 90 tablet 2   Blood Glucose Calibration (TRUE METRIX LEVEL 1) Low SOLN 1 each by In Vitro route every 30 (thirty) days. 1 each 2   Blood Glucose Monitoring Suppl (TRUE METRIX AIR GLUCOSE METER) w/Device KIT 1 each by Does not apply route in the morning and at bedtime. 1 kit 0   carvedilol (COREG) 6.25 MG tablet TAKE 1 TABLET EVERY DAY 90 tablet 1   cyclobenzaprine (FLEXERIL) 5 MG tablet Take 1 tablet (5 mg total) by mouth at bedtime. 10 tablet 0   furosemide (LASIX) 20 MG tablet TAKE 1 TABLET EVERY OTHER DAY 45 tablet 1   HYDROcodone-acetaminophen (NORCO/VICODIN) 5-325 MG tablet Take by mouth.     Magnesium 500 MG TABS Take 500 mg by mouth in the morning.      metFORMIN (GLUCOPHAGE) 500 MG tablet Take 1 tablet (500 mg total) by mouth in the morning.     nitroGLYCERIN (NITROSTAT) 0.4 MG SL tablet DISSOLVE ONE TABLET UNDER TONGUE AS NEEDED FOR CHEST PAIN UP TO 3 TABLETS 25 tablet 11   spironolactone (ALDACTONE) 25 MG tablet TAKE 1/2 TABLET EVERY DAY 45 tablet 1   TRUE METRIX BLOOD GLUCOSE TEST test strip TEST BLOOD SUGAR IN THE MORNING AND AT BEDTIME AS INSTRUCTED 200 strip 3   TRUEplus Lancets 30G MISC TEST BLOOD SUGAR IN THE MORNING AND AT BEDTIME. 200 each 3   No current facility-administered medications on file prior to visit.   Past Medical History:  Diagnosis Date   Abnormal EKG 03/02/2022   Acute renal failure superimposed on stage 3a chronic kidney disease ([place]) 12/08/2020   Arthritis    Atherosclerotic heart disease of native coronary artery with angina pectoris ([place]) 07/20/2017   BMI 32.0-32.9,adult 03/29/2020   Cardiac murmur 03/02/2022   Cardiomyopathy ([place]) 03/16/2022   Cervical spondylosis 01/11/2022   Diabetic glomerulopathy ([place]) 11/27/2019   ED (erectile dysfunction) 11/27/2019   Essential hypertension    HFrEF (heart failure with reduced ejection fraction) ([place]) 03/16/2022   History of BPH    Hyperlipidemia 07/20/2017   LBBB (left bundle branch block) 12/27/2021   Osteoarthritis  of right knee 12/08/2020   Peripheral vascular disease ([place])    Type 2 diabetes mellitus with vascular disease ([place])    Past Surgical History:  Procedure Laterality Date   HERNIA REPAIR     INGUINAL HERNIA REPAIR Bilateral    LEFT HEART CATH AND CORONARY ANGIOGRAPHY N/A 03/23/2022   Procedure: LEFT HEART CATH AND CORONARY ANGIOGRAPHY;  Surgeon: Martinique, Peter M, MD;  Location: [facility];  Service: Cardiovascular;  Laterality: N/A;    Family History  Problem Relation Age of Onset   Cancer Mother    Hypertension Neg Hx    Heart disease Neg Hx    Diabetes Neg Hx    Social History   Socioeconomic History   Marital status: Single    Spouse  name: Not on file   Number of children: 4   Years of education: 12   Highest education level: 12th grade  Occupational History   Occupation: retired   Tobacco Use   Smoking status: Never   Smokeless tobacco: Never  Vaping Use   Vaping Use: Never used  Substance and Sexual Activity   Alcohol use: No   Drug use: No   Sexual activity: Not Currently  Other Topics Concern   Not on file  Social History Narrative   Lives in a hotel and feels safe with his living arrangements.    Social Determinants of Health   Financial Resource Strain: Low Risk  (07/21/2021)   Overall Financial Resource Strain (CARDIA)    Difficulty of Paying Living Expenses: Not very hard  Food Insecurity: No Food Insecurity (07/21/2021)   Hunger Vital Sign    Worried About Running Out of Food in the Last Year: Never true    Ran Out of Food in the Last Year: Never true  Transportation Needs: No Transportation Needs (07/21/2021)   PRAPARE - Hydrologist (Medical): No    Lack of Transportation (Non-Medical): No  Physical Activity: Sufficiently Active (07/21/2021)   Exercise Vital Sign    Days of Exercise per Week: 7 days    Minutes of Exercise per Session: 60 min  Stress: No Stress Concern Present (07/21/2021)   [place]    Feeling of Stress : Not at all  Social Connections: Socially Isolated (07/21/2021)   Social Connection and Isolation Panel [NHANES]    Frequency of Communication with Friends and Family: More than three times a week    Frequency of Social Gatherings with Friends and Family: Never    Attends Religious Services: Never    Marine scientist or Organizations: No    Attends Archivist Meetings: Never    Marital Status: Never married    Review of Systems  Constitutional:  Negative for chills, diaphoresis, fatigue and fever.  HENT:  Negative for congestion, ear pain and sore throat.    Respiratory:  Positive for shortness of breath. Negative for cough.   Cardiovascular:  Positive for chest pain. Negative for leg swelling.  Gastrointestinal:  Negative for abdominal pain, constipation, diarrhea, nausea and vomiting.  Genitourinary:  Negative for dysuria and urgency.  Musculoskeletal:  Negative for arthralgias and myalgias.  Neurological:  Negative for dizziness and headaches.  Psychiatric/Behavioral:  Negative for dysphoric mood.      Objective:  BP 130/76   Pulse 62   Temp (!) 97.1 F (36.2 C)   Ht _0  (1.702 m)   SpO2 99%   BMI  31.48 kg/m       05/02/2022    8:58 AM 03/23/2022   10:22 AM 03/23/2022    9:52 AM  BP/Weight  Systolic BP 287 867 672  Diastolic BP 64 65 72  Wt. (Lbs) 201    BMI 31.48 kg/m2      Physical Exam Constitutional:      Appearance: Normal appearance.  Neck:     Vascular: No carotid bruit.  Cardiovascular:     Rate and Rhythm: Normal rate and regular rhythm.     Heart sounds: Normal heart sounds. No murmur heard. Pulmonary:     Effort: Pulmonary effort is normal.     Breath sounds: Normal breath sounds.  Abdominal:     General: Bowel sounds are normal.     Palpations: Abdomen is soft. There is no mass.     Tenderness: There is no abdominal tenderness.  Musculoskeletal:        General: No swelling.  Skin:    General: Skin is warm.     Capillary Refill: Capillary refill takes less than 2 seconds.  Neurological:     Mental Status: He is alert and oriented to [place], place, and time.  Psychiatric:        Mood and Affect: Mood normal.        Behavior: Behavior normal.         Lab Results  Component Value Date   WBC 4.5 05/08/2022   HGB 12.9 (L) 05/08/2022   HCT 38.1 05/08/2022   PLT 235 05/08/2022   GLUCOSE 78 05/08/2022   CHOL 128 05/08/2022   TRIG 60 05/08/2022   HDL 49 05/08/2022   LDLCALC 66 05/08/2022   ALT 19 05/08/2022   AST 22 05/08/2022   NA 145 (H) 05/08/2022   K 4.4 05/08/2022   CL 111 (H)  05/08/2022   CREATININE 1.05 05/08/2022   BUN 13 05/08/2022   CO2 19 (L) 05/08/2022   HGBA1C 6.6 (H) 05/08/2022   MICROALBUR 10 04/07/2021      Assessment & Plan:   1. Angina pectoris (HCC) - EKG 12-Lead - nitroGLYCERIN (NITROSTAT) SL tablet 0.4 mg - aspirin chewable tablet 324 mg    EMS arrived at 8:18; Pt transported via EMS to [facility] alert and in stable condition    Follow-up: PRN  An After Visit Summary was printed and given to the patient.  I, Rip Harbour, NP, have reviewed all documentation for this visit. The documentation on 08/01/22 for the exam, diagnosis, procedures, and orders are all accurate and complete.    Signed, Rip Harbour, NP [place] [phone]

## 2022-08-02 DIAGNOSIS — E1169 Type 2 diabetes mellitus with other specified complication: Secondary | ICD-10-CM | POA: Diagnosis not present

## 2022-08-02 DIAGNOSIS — I2510 Atherosclerotic heart disease of native coronary artery without angina pectoris: Secondary | ICD-10-CM | POA: Diagnosis not present

## 2022-08-02 DIAGNOSIS — Q219 Congenital malformation of cardiac septum, unspecified: Secondary | ICD-10-CM | POA: Diagnosis not present

## 2022-08-02 DIAGNOSIS — R079 Chest pain, unspecified: Secondary | ICD-10-CM | POA: Diagnosis not present

## 2022-08-02 DIAGNOSIS — R001 Bradycardia, unspecified: Secondary | ICD-10-CM | POA: Diagnosis not present

## 2022-08-02 DIAGNOSIS — G249 Dystonia, unspecified: Secondary | ICD-10-CM | POA: Diagnosis not present

## 2022-09-05 ENCOUNTER — Ambulatory Visit (INDEPENDENT_AMBULATORY_CARE_PROVIDER_SITE_OTHER): Payer: Medicare HMO | Admitting: Nurse Practitioner

## 2022-09-05 ENCOUNTER — Encounter: Payer: Self-pay | Admitting: Nurse Practitioner

## 2022-09-05 VITALS — BP 124/68 | HR 62 | Temp 97.2°F | Ht 67.0 in | Wt 208.0 lb

## 2022-09-05 DIAGNOSIS — I25119 Atherosclerotic heart disease of native coronary artery with unspecified angina pectoris: Secondary | ICD-10-CM

## 2022-09-05 DIAGNOSIS — E1121 Type 2 diabetes mellitus with diabetic nephropathy: Secondary | ICD-10-CM | POA: Diagnosis not present

## 2022-09-05 DIAGNOSIS — I110 Hypertensive heart disease with heart failure: Secondary | ICD-10-CM

## 2022-09-05 DIAGNOSIS — I10 Essential (primary) hypertension: Secondary | ICD-10-CM

## 2022-09-05 DIAGNOSIS — E782 Mixed hyperlipidemia: Secondary | ICD-10-CM

## 2022-09-05 DIAGNOSIS — I5020 Unspecified systolic (congestive) heart failure: Secondary | ICD-10-CM | POA: Diagnosis not present

## 2022-09-05 DIAGNOSIS — E1159 Type 2 diabetes mellitus with other circulatory complications: Secondary | ICD-10-CM

## 2022-09-05 NOTE — Progress Notes (Signed)
Subjective:  Patient ID: Mark [name], male    DOB: 04-May-1946  Age: 77 y.o. MRN: [id number]  Chief Complaint  Patient presents with   Diabetes   Hyperlipidemia   Hypertension   HPI: Pt presents for follow -up of T2DM, HTN, and Hyperlipidemia. He has a history of CAD with stable angina, CHF with reduced EF, and previous MI. He was hospitalized 07/2022 for chest pain at [facility]. TEE revealed EF 25-30% on 08/17/22. He is followed by Dr Agustin Cree, cardiologist. Denies current cp, dyspnea, or near syncope.      Diabetes Mellitus Type II, follow-up  Lab Results  Component Value Date   HGBA1C 6.6 (H) 05/08/2022   HGBA1C 6.7 (H) 12/27/2021   HGBA1C 6.4 (H) 08/17/2021   Last seen for diabetes 3 months ago.  Management since then includes Metformin 500 mg QD He reports excellent compliance with treatment. He is not having side effects.   Home blood sugar records:  unknown  Most Recent Eye Exam: 2023  --------------------------------------------------------------------------------------------------- Hypertension, follow-up  BP Readings from Last 3 Encounters:  08/01/22 130/76  05/02/22 120/64  03/23/22 122/65   Wt Readings from Last 3 Encounters:  09/05/22 208 lb (94.3 kg)  05/02/22 201 lb (91.2 kg)  03/23/22 210 lb (95.3 kg)      BP at that visit was 130/76. Management since that visit includes Spironolactone 12.5 mg QD, Coreg 6.25 BID. He reports excellent compliance with treatment. He is not having side effects.  He does not smoke. Use of agents associated with hypertension: none.   --------------------------------------------------------------------------------------------------- Lipid/Cholesterol, follow-up  Last Lipid Panel: Lab Results  Component Value Date   CHOL 128 05/08/2022   LDLCALC 66 05/08/2022   HDL 49 05/08/2022   TRIG 60 05/08/2022    Management since that visit includes Lipitor .  He reports excellent compliance with  treatment. He is not having side effects.   Last metabolic panel Lab Results  Component Value Date   GLUCOSE 78 05/08/2022   NA 145 (H) 05/08/2022   K 4.4 05/08/2022   BUN 13 05/08/2022   CREATININE 1.05 05/08/2022   GFRNONAA 59 (L) 08/09/2020   GFRAA 68 08/09/2020   CALCIUM 9.6 05/08/2022   AST 22 05/08/2022   ALT 19 05/08/2022   The ASCVD Risk score (Arnett DK, et al., 2019) failed to calculate for the following reasons:   The valid total cholesterol range is 130 to 320 mg/dL  ---------------------------------------------------------------------------------------------------      09/05/2022    8:22 AM 12/27/2021    7:48 AM 07/21/2021    7:48 AM 04/07/2021    7:46 AM 02/22/2021    2:06 PM  Depression screen PHQ 2/9  Decreased Interest 0 0 0 0 0  Down, Depressed, Hopeless 0 0 0 0 0  PHQ - 2 Score 0 0 0 0 0         12/27/2021    7:48 AM 01/13/2022    9:29 AM 03/23/2022    7:39 AM 08/01/2022    8:09 AM 09/05/2022    8:22 AM  Fall Risk  Falls in the past year? 0   1 0  Was there an injury with Fall? 0   0 0  Fall Risk Category Calculator 0   1 0  Fall Risk Category (Retired) Low   Low   (RETIRED) Patient Fall Risk Level Low fall risk Low fall risk Low fall risk Low fall risk   Patient at Risk for Falls Due to  No Fall Risks No Fall Risks  Fall risk Follow up    Falls evaluation completed Falls evaluation completed      Review of Systems  Constitutional:  Negative for chills, diaphoresis, fatigue and fever.  HENT:  Negative for congestion, ear pain and sore throat.   Respiratory:  Negative for cough and shortness of breath.   Cardiovascular:  Negative for chest pain and leg swelling.  Gastrointestinal:  Negative for abdominal pain, constipation, diarrhea, nausea and vomiting.  Genitourinary:  Negative for dysuria and urgency.  Musculoskeletal:  Negative for arthralgias and myalgias.  Neurological:  Negative for dizziness and headaches.  Psychiatric/Behavioral:   Negative for dysphoric mood.     Current Outpatient Medications on File Prior to Visit  Medication Sig Dispense Refill   albuterol (VENTOLIN HFA) 108 (90 Base) MCG/ACT inhaler Inhale 1-2 puffs into the lungs as needed for wheezing or shortness of breath.     Alcohol Swabs (DROPSAFE ALCOHOL PREP) 70 % PADS USE AS DIRECTED TWICE DAILY 200 each 3   aspirin 81 MG chewable tablet Chew 81 mg by mouth daily.     atorvastatin (LIPITOR) 20 MG tablet TAKE 1 TABLET EVERY DAY 90 tablet 2   Blood Glucose Calibration (TRUE METRIX LEVEL 1) Low SOLN 1 each by In Vitro route every 30 (thirty) days. 1 each 2   Blood Glucose Monitoring Suppl (TRUE METRIX AIR GLUCOSE METER) w/Device KIT 1 each by Does not apply route in the morning and at bedtime. 1 kit 0   carvedilol (COREG) 6.25 MG tablet TAKE 1 TABLET EVERY DAY 90 tablet 1   cyclobenzaprine (FLEXERIL) 5 MG tablet Take 1 tablet (5 mg total) by mouth at bedtime. 10 tablet 0   furosemide (LASIX) 20 MG tablet TAKE 1 TABLET EVERY OTHER DAY 45 tablet 1   HYDROcodone-acetaminophen (NORCO/VICODIN) 5-325 MG tablet Take by mouth.     Magnesium 500 MG TABS Take 500 mg by mouth in the morning.     metFORMIN (GLUCOPHAGE) 500 MG tablet Take 1 tablet (500 mg total) by mouth in the morning.     nitroGLYCERIN (NITROSTAT) 0.4 MG SL tablet DISSOLVE ONE TABLET UNDER TONGUE AS NEEDED FOR CHEST PAIN UP TO 3 TABLETS 25 tablet 11   spironolactone (ALDACTONE) 25 MG tablet TAKE 1/2 TABLET EVERY DAY 45 tablet 1   TRUE METRIX BLOOD GLUCOSE TEST test strip TEST BLOOD SUGAR IN THE MORNING AND AT BEDTIME AS INSTRUCTED 200 strip 3   TRUEplus Lancets 30G MISC TEST BLOOD SUGAR IN THE MORNING AND AT BEDTIME. 200 each 3   No current facility-administered medications on file prior to visit.   Past Medical History:  Diagnosis Date   Abnormal EKG 03/02/2022   Acute renal failure superimposed on stage 3a chronic kidney disease (HCC) 12/08/2020   Arthritis    Atherosclerotic heart disease of  native coronary artery with angina pectoris (HCC) 07/20/2017   BMI 32.0-32.9,adult 03/29/2020   Cardiac murmur 03/02/2022   Cardiomyopathy (HCC) 03/16/2022   Cervical spondylosis 01/11/2022   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   ED (erectile dysfunction) 11/27/2019   Essential hypertension    HFrEF (heart failure with reduced ejection fraction) (HCC) 03/16/2022   History of BPH    Hyperlipidemia 07/20/2017   LBBB (left bundle branch block) 12/27/2021   Osteoarthritis of right knee 12/08/2020   Peripheral vascular disease (HCC)    Type 2 diabetes mellitus with vascular disease (HCC)    Past Surgical History:  Procedure Laterality Date   HERNIA REPAIR  INGUINAL HERNIA REPAIR Bilateral    LEFT HEART CATH AND CORONARY ANGIOGRAPHY N/A 03/23/2022   Procedure: LEFT HEART CATH AND CORONARY ANGIOGRAPHY;  Surgeon: [place], Peter M, MD;  Location[facility] INVASIVE CV [facility];  Service: Cardiovascular;  Laterality: N/A;    Family History  Problem Relation Age of Onset   Cancer Mother    Hypertension Neg Hx    Heart disease Neg Hx    Diabetes Neg Hx    Social History   Socioeconomic History   Marital status: Single    Spouse name: Not on file   Number of children: 4   Years of education: 12   Highest education level: 12th grade  Occupational History   Occupation: retired   Tobacco Use   Smoking status: Never   Smokeless tobacco: Never  Vaping Use   Vaping Use: Never used  Substance and Sexual Activity   Alcohol use: No   Drug use: No   Sexual activity: Not Currently  Other Topics Concern   Not on file  Social History Narrative   Lives in a hotel and feels safe with his living arrangements.    Social Determinants of Health   Financial Resource Strain: Low Risk  (07/21/2021)   Overall Financial Resource Strain (CARDIA)    Difficulty of Paying Living Expenses: Not very hard  Food Insecurity: No Food Insecurity (07/21/2021)   Hunger Vital Sign    Worried About Running Out of Food  in the Last Year: Never true    Ran Out of Food in the Last Year: Never true  Transportation Needs: No Transportation Needs (07/21/2021)   PRAPARE - Administrator, Civil Service (Medical): No    Lack of Transportation (Non-Medical): No  Physical Activity: Sufficiently Active (07/21/2021)   Exercise Vital Sign    Days of Exercise per Week: 7 days    Minutes of Exercise per Session: 60 min  Stress: No Stress Concern Present (07/21/2021)   [employer] of Occupational Health - Occupational Stress Questionnaire    Feeling of Stress : Not at all  Social Connections: Socially Isolated (07/21/2021)   Social Connection and Isolation Panel [NHANES]    Frequency of Communication with Friends and Family: More than three times a week    Frequency of Social Gatherings with Friends and Family: Never    Attends Religious Services: Never    Diplomatic Services operational officer: No    Attends Engineer, structural: Never    Marital Status: Never married    Objective:  BP 124/68   Pulse 62   Temp (!) 97.2 F (36.2 C)   Ht 5\' 7"  (1.702 m)   Wt 208 lb (94.3 kg)   SpO2 99%   BMI 32.58 kg/m       09/05/2022    8:22 AM 08/01/2022    8:00 AM 05/02/2022    8:58 AM  BP/Weight  Systolic BP  130 05/04/2022  Diastolic BP  76 64  Wt. (Lbs) 208  201  BMI 32.58 kg/m2  31.48 kg/m2    Physical Exam Constitutional:      Appearance: Normal appearance.  Cardiovascular:     Rate and Rhythm: Normal rate and regular rhythm.     Heart sounds: Normal heart sounds. No murmur heard. Pulmonary:     Effort: Pulmonary effort is normal.     Breath sounds: Normal breath sounds.  Abdominal:     General: Bowel sounds are normal.     Palpations: Abdomen  is soft. There is no mass.     Tenderness: There is no abdominal tenderness.  Musculoskeletal:        General: No swelling.  Neurological:     Mental Status: He is alert and oriented to [place], place, and time.  Psychiatric:        Mood and  Affect: Mood normal.        Behavior: Behavior normal.       Lab Results  Component Value Date   WBC 4.5 05/08/2022   HGB 12.9 (L) 05/08/2022   HCT 38.1 05/08/2022   PLT 235 05/08/2022   GLUCOSE 78 05/08/2022   CHOL 128 05/08/2022   TRIG 60 05/08/2022   HDL 49 05/08/2022   LDLCALC 66 05/08/2022   ALT 19 05/08/2022   AST 22 05/08/2022   NA 145 (H) 05/08/2022   K 4.4 05/08/2022   CL 111 (H) 05/08/2022   CREATININE 1.05 05/08/2022   BUN 13 05/08/2022   CO2 19 (L) 05/08/2022   HGBA1C 6.6 (H) 05/08/2022   MICROALBUR 10 04/07/2021      Assessment & Plan:     1. Essential hypertension-well controlled - CBC with Differential/Platelet; Future - Comprehensive metabolic panel; Future  2. Type 2 diabetes mellitus with vascular disease (HCC)-well controlled - CBC with Differential/Platelet; Future - Comprehensive metabolic panel; Future - Hemoglobin A1c; Future - Microalbumin / creatinine urine ratio; Future  3. Diabetic glomerulopathy (HCC)-well controlled - CBC with Differential/Platelet; Future - Comprehensive metabolic panel; Future - Hemoglobin A1c; Future - Microalbumin / creatinine urine ratio; Future  4. Mixed hyperlipidemia-well controlled - CBC with Differential/Platelet; Future - Comprehensive metabolic panel; Future - Lipid panel; Future  5. Atherosclerosis of native coronary artery of native heart with angina pectoris (HCC)-stable - CBC with Differential/Platelet; Future - Comprehensive metabolic panel; Future - Lipid panel; Future -continue Lipitor 20 mg QD  6. HFrEF (heart failure with reduced ejection fraction) (HCC) - CBC with Differential/Platelet; Future - Comprehensive metabolic panel; Future -EF decreased to 25-30%  -follow-up with cardiology as scheduled   Continue medications We will call you with lab results Follow-up 6 months  Follow-up: 56-months, fasting  An After Visit Summary was printed and given to the patient.  I, Rip Harbour, NP, have reviewed all documentation for this visit. The documentation on 09/05/22 for the exam, diagnosis, procedures, and orders are all accurate and complete.    Signed, Rip Harbour, NP [place] [phone]

## 2022-09-05 NOTE — Patient Instructions (Signed)
Continue medications We will call you with lab results Follow-up 6 months   Diabetes Mellitus and Foot Care Diabetes, also called diabetes mellitus, may cause problems with your feet and legs because of poor blood flow (circulation). Poor circulation may make your skin: Become thinner and drier. Break more easily. Heal more slowly. Peel and crack. You may also have nerve damage (neuropathy). This can cause decreased feeling in your legs and feet. This means that you may not notice minor injuries to your feet that could lead to more serious problems. Finding and treating problems early is the best way to prevent future foot problems. How to care for your feet Foot hygiene  Wash your feet daily with warm water and mild soap. Do not use hot water. Then, pat your feet and the areas between your toes until they are fully dry. Do not soak your feet. This can dry your skin. Trim your toenails straight across. Do not dig under them or around the cuticle. File the edges of your nails with an emery board or nail file. Apply a moisturizing lotion or petroleum jelly to the skin on your feet and to dry, brittle toenails. Use lotion that does not contain alcohol and is unscented. Do not apply lotion between your toes. Shoes and socks Wear clean socks or stockings every day. Make sure they are not too tight. Do not wear knee-high stockings. These may decrease blood flow to your legs. Wear shoes that fit well and have enough cushioning. Always look in your shoes before you put them on to be sure there are no objects inside. To break in new shoes, wear them for just a few hours a day. This prevents injuries on your feet. Wounds, scrapes, corns, and calluses  Check your feet daily for blisters, cuts, bruises, sores, and redness. If you cannot see the bottom of your feet, use a mirror or ask someone for help. Do not cut off corns or calluses or try to remove them with medicine. If you find a minor scrape,  cut, or break in the skin on your feet, keep it and the skin around it clean and dry. You may clean these areas with mild soap and water. Do not clean the area with peroxide, alcohol, or iodine. If you have a wound, scrape, corn, or callus on your foot, look at it several times a day to make sure it is healing and not infected. Check for: Redness, swelling, or pain. Fluid or blood. Warmth. Pus or a bad smell. General tips Do not cross your legs. This may decrease blood flow to your feet. Do not use heating pads or hot water bottles on your feet. They may burn your skin. If you have lost feeling in your feet or legs, you may not know this is happening until it is too late. Protect your feet from hot and cold by wearing shoes, such as at the beach or on hot pavement. Schedule a complete foot exam at least once a year or more often if you have foot problems. Report any cuts, sores, or bruises to your health care provider right away. Where to find more information American Diabetes Association: diabetes.org Association of Diabetes Care & Education Specialists: diabeteseducator.org Contact a health care provider if: You have a condition that increases your risk of infection, and you have any cuts, sores, or bruises on your feet. You have an injury that is not healing. You have redness on your legs or feet. You feel burning or  tingling in your legs or feet. You have pain or cramps in your legs and feet. Your legs or feet are numb. Your feet always feel cold. You have pain around any toenails. Get help right away if: You have a wound, scrape, corn, or callus on your foot and: You have signs of infection. You have a fever. You have a red line going up your leg. This information is not intended to replace advice given to you by your health care provider. Make sure you discuss any questions you have with your health care provider. Document Revised: 02/01/2022 Document Reviewed:  02/01/2022 Elsevier Patient Education  [place].

## 2022-09-06 ENCOUNTER — Other Ambulatory Visit: Payer: Medicare HMO

## 2022-10-09 ENCOUNTER — Other Ambulatory Visit: Payer: Self-pay | Admitting: Family Medicine

## 2022-10-09 DIAGNOSIS — E1159 Type 2 diabetes mellitus with other circulatory complications: Secondary | ICD-10-CM

## 2022-10-12 ENCOUNTER — Ambulatory Visit (INDEPENDENT_AMBULATORY_CARE_PROVIDER_SITE_OTHER): Payer: Medicare HMO

## 2022-10-12 ENCOUNTER — Other Ambulatory Visit: Payer: Self-pay | Admitting: Family Medicine

## 2022-10-12 VITALS — Ht 67.0 in | Wt 205.0 lb

## 2022-10-12 DIAGNOSIS — Z1211 Encounter for screening for malignant neoplasm of colon: Secondary | ICD-10-CM

## 2022-10-12 DIAGNOSIS — Z0000 Encounter for general adult medical examination without abnormal findings: Secondary | ICD-10-CM

## 2022-10-12 DIAGNOSIS — M859 Disorder of bone density and structure, unspecified: Secondary | ICD-10-CM

## 2022-10-12 NOTE — Patient Instructions (Signed)
Health Maintenance, Male Adopting a healthy lifestyle and getting preventive care are important in promoting health and wellness. Ask your health care provider about: The right schedule for you to have regular tests and exams. Things you can do on your own to prevent diseases and keep yourself healthy. What should I know about diet, weight, and exercise? Eat a healthy diet  Eat a diet that includes plenty of vegetables, fruits, low-fat dairy products, and lean protein. Do not eat a lot of foods that are high in solid fats, added sugars, or sodium. Maintain a healthy weight Body mass index (BMI) is a measurement that can be used to identify possible weight problems. It estimates body fat based on height and weight. Your health care provider can help determine your BMI and help you achieve or maintain a healthy weight. Get regular exercise Get regular exercise. This is one of the most important things you can do for your health. Most adults should: Exercise for at least 150 minutes each week. The exercise should increase your heart rate and make you sweat (moderate-intensity exercise). Do strengthening exercises at least twice a week. This is in addition to the moderate-intensity exercise. Spend less time sitting. Even light physical activity can be beneficial. Watch cholesterol and blood lipids Have your blood tested for lipids and cholesterol at 77 years of age, then have this test every 5 years. You may need to have your cholesterol levels checked more often if: Your lipid or cholesterol levels are high. You are older than 77 years of age. You are at high risk for heart disease. What should I know about cancer screening? Many types of cancers can be detected early and may often be prevented. Depending on your health history and family history, you may need to have cancer screening at various ages. This may include screening for: Colorectal cancer. Prostate cancer. Skin cancer. Lung  cancer. What should I know about heart disease, diabetes, and high blood pressure? Blood pressure and heart disease High blood pressure causes heart disease and increases the risk of stroke. This is more likely to develop in people who have high blood pressure readings or are overweight. Talk with your health care provider about your target blood pressure readings. Have your blood pressure checked: Every 3-5 years if you are 18-39 years of age. Every year if you are 40 years old or older. If you are between the ages of 65 and 75 and are a current or former smoker, ask your health care provider if you should have a one-time screening for abdominal aortic aneurysm (AAA). Diabetes Have regular diabetes screenings. This checks your fasting blood sugar level. Have the screening done: Once every three years after age 45 if you are at a normal weight and have a low risk for diabetes. More often and at a younger age if you are overweight or have a high risk for diabetes. What should I know about preventing infection? Hepatitis B If you have a higher risk for hepatitis B, you should be screened for this virus. Talk with your health care provider to find out if you are at risk for hepatitis B infection. Hepatitis C Blood testing is recommended for: Everyone born from 1945 through 1965. Anyone with known risk factors for hepatitis C. Sexually transmitted infections (STIs) You should be screened each year for STIs, including gonorrhea and chlamydia, if: You are sexually active and are younger than 77 years of age. You are older than 77 years of age and your   health care provider tells you that you are at risk for this type of infection. Your sexual activity has changed since you were last screened, and you are at increased risk for chlamydia or gonorrhea. Ask your health care provider if you are at risk. Ask your health care provider about whether you are at high risk for HIV. Your health care provider  may recommend a prescription medicine to help prevent HIV infection. If you choose to take medicine to prevent HIV, you should first get tested for HIV. You should then be tested every 3 months for as long as you are taking the medicine. Follow these instructions at home: Alcohol use Do not drink alcohol if your health care provider tells you not to drink. If you drink alcohol: Limit how much you have to 0-2 drinks a day. Know how much alcohol is in your drink. In the [place] one drink equals one 12 oz bottle of beer (355 mL), one 5 oz glass of wine (148 mL), or one 1 oz glass of hard liquor (44 mL). Lifestyle Do not use any products that contain nicotine or tobacco. These products include cigarettes, chewing tobacco, and vaping devices, such as e-cigarettes. If you need help quitting, ask your health care provider. Do not use street drugs. Do not share needles. Ask your health care provider for help if you need support or information about quitting drugs. General instructions Schedule regular health, dental, and eye exams. Stay current with your vaccines. Tell your health care provider if: You often feel depressed. You have ever been abused or do not feel safe at home. Summary Adopting a healthy lifestyle and getting preventive care are important in promoting health and wellness. Follow your health care provider's instructions about healthy diet, exercising, and getting tested or screened for diseases. Follow your health care provider's instructions on monitoring your cholesterol and blood pressure. This information is not intended to replace advice given to you by your health care provider. Make sure you discuss any questions you have with your health care provider. Document Revised: 12/20/2020 Document Reviewed: 12/20/2020 Elsevier Patient Education  2023 Elsevier Inc.  

## 2022-10-12 NOTE — Progress Notes (Signed)
Subjective:   Mark [name] is a 77 y.o. male who presents for Medicare Annual/Subsequent preventive examination.  I connected with  Mark [name] on 10/12/22 by a phone enabled telemedicine application and verified that I am speaking with the correct Clock using two identifiers.   I discussed the limitations of evaluation and management by telemedicine. The patient expressed understanding and agreed to proceed.   Cardiac Risk Factors include: diabetes mellitus;advanced age (>76mn, >>9women);dyslipidemia     Objective:    Today's Vitals   10/12/22 0838  Weight: 205 lb (93 kg)  Height: '5\' 7"'$  (1.702 m)   Body mass index is 32.11 kg/m.     10/12/2022    8:40 AM 03/23/2022    7:44 AM 01/13/2022    9:29 AM 07/20/2020    2:10 PM 04/18/2020   12:33 PM 06/09/2019    3:42 PM 05/17/2018    9:40 AM  Advanced Directives  Does Patient Have a Medical Advance Directive? No No No No No No No  Would patient like information on creating a medical advance directive? Yes (ED - Information included in AVS) No - Patient declined No - Patient declined   No - Patient declined No - Patient declined    Current Medications (verified) Outpatient Encounter Medications as of 10/12/2022  Medication Sig   albuterol (VENTOLIN HFA) 108 (90 Base) MCG/ACT inhaler Inhale 1-2 puffs into the lungs as needed for wheezing or shortness of breath.   Alcohol Swabs (DROPSAFE ALCOHOL PREP) 70 % PADS USE AS DIRECTED TWICE DAILY   aspirin 81 MG chewable tablet Chew 81 mg by mouth daily.   atorvastatin (LIPITOR) 20 MG tablet TAKE 1 TABLET EVERY DAY   Blood Glucose Calibration (TRUE METRIX LEVEL 1) Low SOLN 1 each by In Vitro route every 30 (thirty) days.   Blood Glucose Monitoring Suppl (TRUE METRIX AIR GLUCOSE METER) w/Device KIT 1 each by Does not apply route in the morning and at bedtime.   carvedilol (COREG) 6.25 MG tablet TAKE 1 TABLET EVERY DAY   cyclobenzaprine (FLEXERIL) 5 MG tablet Take 1 tablet (5 mg  total) by mouth at bedtime.   furosemide (LASIX) 20 MG tablet TAKE 1 TABLET EVERY OTHER DAY   Magnesium 500 MG TABS Take 500 mg by mouth in the morning.   metFORMIN (GLUCOPHAGE) 500 MG tablet TAKE 1/2 TABLET TWICE DAILY   nitroGLYCERIN (NITROSTAT) 0.4 MG SL tablet DISSOLVE ONE TABLET UNDER TONGUE AS NEEDED FOR CHEST PAIN UP TO 3 TABLETS   spironolactone (ALDACTONE) 25 MG tablet TAKE 1/2 TABLET EVERY DAY   TRUE METRIX BLOOD GLUCOSE TEST test strip TEST BLOOD SUGAR IN THE MORNING AND AT BEDTIME AS INSTRUCTED   TRUEplus Lancets 30G MISC TEST BLOOD SUGAR IN THE MORNING AND AT BEDTIME.   [DISCONTINUED] HYDROcodone-acetaminophen (NORCO/VICODIN) 5-325 MG tablet Take by mouth.   [DISCONTINUED] PERCOCET 5-325 MG tablet SMARTSIG:1 Each By Mouth Every 6 Hours PRN   No facility-administered encounter medications on file as of 10/12/2022.    Allergies (verified) Penicillins   History: Past Medical History:  Diagnosis Date   Abnormal EKG 03/02/2022   Acute renal failure superimposed on stage 3a chronic kidney disease ([facility] 12/08/2020   Arthritis    Atherosclerotic heart disease of native coronary artery with angina pectoris ([facility] 07/20/2017   BMI 32.0-32.9,adult 03/29/2020   Cardiac murmur 03/02/2022   Cardiomyopathy [place] 03/16/2022   Cervical spondylosis 01/11/2022   Diabetic glomerulopathy ([facility] 11/27/2019   ED (erectile dysfunction) 11/27/2019  Essential hypertension    HFrEF (heart failure with reduced ejection fraction) ([place]) 03/16/2022   History of BPH    Hyperlipidemia 07/20/2017   LBBB (left bundle branch block) 12/27/2021   Osteoarthritis of right knee 12/08/2020   Peripheral vascular disease ([place])    Type 2 diabetes mellitus with vascular disease ([place])    Past Surgical History:  Procedure Laterality Date   HERNIA REPAIR     INGUINAL HERNIA REPAIR Bilateral    LEFT HEART CATH AND CORONARY ANGIOGRAPHY N/A 03/23/2022   Procedure: LEFT HEART CATH AND CORONARY ANGIOGRAPHY;  Surgeon:  Martinique, Peter M, MD;  Location: [facility];  Service: Cardiovascular;  Laterality: N/A;   Family History  Problem Relation Age of Onset   Cancer Mother    Hypertension Neg Hx    Heart disease Neg Hx    Diabetes Neg Hx    Social History   Socioeconomic History   Marital status: Single    Spouse name: Not on file   Number of children: 4   Years of education: 12   Highest education level: 12th grade  Occupational History   Occupation: retired   Tobacco Use   Smoking status: Never   Smokeless tobacco: Never  Vaping Use   Vaping Use: Never used  Substance and Sexual Activity   Alcohol use: No   Drug use: No   Sexual activity: Not Currently  Other Topics Concern   Not on file  Social History Narrative   Lives in a hotel and feels safe with his living arrangements.    Social Determinants of Health   Financial Resource Strain: Low Risk  (10/12/2022)   Overall Financial Resource Strain (CARDIA)    Difficulty of Paying Living Expenses: Not very hard  Food Insecurity: No Food Insecurity (10/12/2022)   Hunger Vital Sign    Worried About Running Out of Food in the Last Year: Never true    Ran Out of Food in the Last Year: Never true  Transportation Needs: No Transportation Needs (10/12/2022)   PRAPARE - Hydrologist (Medical): No    Lack of Transportation (Non-Medical): No  Physical Activity: Sufficiently Active (10/12/2022)   Exercise Vital Sign    Days of Exercise per Week: 4 days    Minutes of Exercise per Session: 60 min  Stress: No Stress Concern Present (10/12/2022)   [place]    Feeling of Stress : Not at all  Social Connections: Moderately Isolated (10/12/2022)   Social Connection and Isolation Panel [NHANES]    Frequency of Communication with Friends and Family: More than three times a week    Frequency of Social Gatherings with Friends and Family: Twice a week     Attends Religious Services: 1 to 4 times per year    Active Member of [employer] or Organizations: No    Attends Music therapist: Never    Marital Status: Never married    Tobacco Counseling Counseling given: Not Answered   Clinical Intake:  Pre-visit preparation completed: Yes  Pain : No/denies pain     BMI - recorded: 32.11 Nutritional Status: BMI > 30  Obese Nutritional Risks: None Diabetes: Yes CBG done?: Yes CBG resulted in Enter/ Edit results?: Yes (112 mg/dl) Did pt. bring in CBG monitor from home?: No Glucose Meter Downloaded?: No  How often do you need to have someone help you when you read instructions, pamphlets, or other written materials  from your doctor or pharmacy?: 1 - Never What is the last grade level you completed in school?: high school diploma  Diabetic?no  Interpreter Needed?: No      Activities of Daily Living    10/12/2022    8:43 AM 09/05/2022    8:23 AM  In your present state of health, do you have any difficulty performing the following activities:  Hearing? 0 0  Vision? 0 0  Difficulty concentrating or making decisions? 0 0  Walking or climbing stairs? 0 0  Dressing or bathing? 0 0  Doing errands, shopping? 0 0  Preparing Food and eating ? N   Using the Toilet? N   In the past six months, have you accidently leaked urine? N   Do you have problems with loss of bowel control? N   Managing your Medications? N   Managing your Finances? N   Housekeeping or managing your Housekeeping? N     Patient Care Team: Rip Harbour, NP (Inactive) as PCP - General (Nurse Practitioner)  Indicate any recent Medical Services you may have received from other than [facility] providers in the past year (date may be approximate).     Assessment:   This is a routine wellness examination for [place].  Hearing/Vision screen No results found.  Dietary issues and exercise activities discussed: Current Exercise Habits: The patient does not  participate in regular exercise at present, Exercise limited by: cardiac condition(s)   Goals Addressed               This Visit's Progress     Activity and Exercise Increased (pt-stated)        Evidence-based guidance:  Review current exercise levels.  Recommend or set healthy exercise goal based on individual tolerance.  Urge reduction of sedentary activities and screen time of greater rhan 2 hours per day.  Promote group activities within the community or with family or support [place].    Notes: Patient's personal goal is to go back to gym and have a scheduled routine to do exercise because he wants to look good and feel good about himself.      Depression Screen    10/12/2022    8:43 AM 09/05/2022    8:22 AM 12/27/2021    7:48 AM 07/21/2021    7:48 AM 04/07/2021    7:46 AM 02/22/2021    2:06 PM 07/20/2020    2:11 PM  PHQ 2/9 Scores  PHQ - 2 Score 0 0 0 0 0 0 0    Fall Risk    10/12/2022    8:42 AM 09/05/2022    8:22 AM 08/01/2022    8:09 AM 12/27/2021    7:48 AM 04/07/2021    7:46 AM  Fall Risk   Falls in the past year? 0 0 1 0 0  Number falls in past yr: 0 0 0 0 0  Injury with Fall? 0 0 0 0 0  Risk for fall due to : No Fall Risks No Fall Risks No Fall Risks    Follow up  Falls evaluation completed Falls evaluation completed      FALL RISK PREVENTION PERTAINING TO THE HOME:  Any stairs in or around the home? Yes  If so, are there any without handrails? Yes  Home free of loose throw rugs in walkways, pet beds, electrical cords, etc? No  Adequate lighting in your home to reduce risk of falls? Yes   ASSISTIVE DEVICES UTILIZED TO PREVENT FALLS:  Life  alert? No  Use of a cane, walker or w/c? No  Grab bars in the bathroom? Yes  Shower chair or bench in shower? No  Elevated toilet seat or a handicapped toilet? No   TIMED UP AND GO:  Was the test performed? No .    Cognitive Function:        10/12/2022    8:46 AM 07/21/2021    7:51 AM 07/20/2020    2:14 PM   6CIT Screen  What Year? 0 points 0 points 0 points  What month? 0 points 0 points 0 points  What time? 0 points 0 points 0 points  Count back from 20 0 points 0 points 0 points  Months in reverse 2 points 0 points 0 points  Repeat phrase 2 points 0 points 0 points  Total Score 4 points 0 points 0 points    Immunizations Immunization History  Administered Date(s) Administered   Fluad Quad(high Dose 65+) 07/20/2020, 05/19/2021, 05/02/2022   Influenza-Unspecified 10/30/2016, 06/14/2018, 07/23/2019   PPD Test 12/10/2018, 02/28/2021   Pfizer Covid-19 Vaccine Bivalent Booster 13yr & up 05/19/2021   Pneumococcal Conjugate-13 10/30/2016   Pneumococcal Polysaccharide-23 07/05/2018    TDAP status: Due, Education has been provided regarding the importance of this vaccine. Advised may receive this vaccine at local pharmacy or Health Dept. Aware to provide a copy of the vaccination record if obtained from local pharmacy or Health Dept. Verbalized acceptance and understanding.  Flu Vaccine status: Up to date  Pneumococcal vaccine status: Up to date  Covid-19 vaccine status: Completed vaccines  Qualifies for Shingles Vaccine? Yes   Zostavax completed No   Shingrix Completed?: No.    Education has been provided regarding the importance of this vaccine. Patient has been advised to call insurance company to determine out of pocket expense if they have not yet received this vaccine. Advised may also receive vaccine at local pharmacy or Health Dept. Verbalized acceptance and understanding.  Screening Tests Health Maintenance  Topic Date Due   Hepatitis C Screening  Never done   DTaP/Tdap/Td (1 - Tdap) Never done   Zoster Vaccines- Shingrix (1 of 2) Never done   COVID-19 Vaccine (2 - 2023-24 season) 04/14/2022   HEMOGLOBIN A1C  11/06/2022   Diabetic kidney evaluation - Urine ACR  05/03/2023   FOOT EXAM  05/03/2023   OPHTHALMOLOGY EXAM  05/04/2023   Diabetic kidney evaluation - eGFR  measurement  05/09/2023   Medicare Annual Wellness (AWV)  10/12/2023   Pneumonia Vaccine 77 Years old  Completed   INFLUENZA VACCINE  Completed   HPV VACCINES  Aged Out   COLONOSCOPY (Pts 45-427yrInsurance coverage will need to be confirmed)  Discontinued    Health Maintenance  Health Maintenance Due  Topic Date Due   Hepatitis C Screening  Never done   DTaP/Tdap/Td (1 - Tdap) Never done   Zoster Vaccines- Shingrix (1 of 2) Never done   COVID-19 Vaccine (2 - 2023-24 season) 04/14/2022    Colorectal cancer screening: Type of screening: Colonoscopy. Completed 05/29/2012. Repeat every 10 years Order is in for GI referral.  Lung Cancer Screening: (Low Dose CT Chest recommended if Age 77-80ears, 30 pack-year currently smoking OR have quit w/in 15years.) does not qualify.   Lung Cancer Screening Referral: n/a  Additional Screening:  Hepatitis C Screening: does qualify; Completed next visit.  Vision Screening: Recommended annual ophthalmology exams for early detection of glaucoma and other disorders of the eye. Is the patient up to date with  their annual eye exam?  Yes  Who is the provider or what is the name of the office in which the patient attends annual eye exams? Dr Gilford Rile If pt is not established with a provider, would they like to be referred to a provider to establish care? No .   Dental Screening: Recommended annual dental exams for proper oral hygiene  Community Resource Referral / Chronic Care Management: CRR required this visit?  No   CCM required this visit?  No      Plan:     I have personally reviewed and noted the following in the patient's chart:   Medical and social history Use of alcohol, tobacco or illicit drugs  Current medications and supplements including opioid prescriptions. Patient is not currently taking opioid prescriptions. Functional ability and status Nutritional status Physical activity Advanced directives List of other  physicians Hospitalizations, surgeries, and ER visits in previous 12 months Vitals Screenings to include cognitive, depression, and falls Referrals and appointments  In addition, I have reviewed and discussed with patient certain preventive protocols, quality metrics, and best practice recommendations. A written personalized care plan for preventive services as well as general preventive health recommendations were provided to patient.     Augustin Coupe, [place]   10/12/2022   Nurse Notes:I recommended to ask for shingles vaccine at the pharmacy. He wants pneumonia shot at the office. I will mail information about advance directives and AVS.  40 minutes by phone.

## 2022-10-31 ENCOUNTER — Other Ambulatory Visit: Payer: Self-pay

## 2022-10-31 DIAGNOSIS — E782 Mixed hyperlipidemia: Secondary | ICD-10-CM

## 2022-10-31 DIAGNOSIS — I10 Essential (primary) hypertension: Secondary | ICD-10-CM

## 2022-10-31 DIAGNOSIS — E1121 Type 2 diabetes mellitus with diabetic nephropathy: Secondary | ICD-10-CM

## 2022-11-01 ENCOUNTER — Other Ambulatory Visit (INDEPENDENT_AMBULATORY_CARE_PROVIDER_SITE_OTHER): Payer: Medicare HMO

## 2022-11-01 DIAGNOSIS — Z23 Encounter for immunization: Secondary | ICD-10-CM

## 2022-11-01 DIAGNOSIS — I10 Essential (primary) hypertension: Secondary | ICD-10-CM | POA: Diagnosis not present

## 2022-11-01 DIAGNOSIS — E1121 Type 2 diabetes mellitus with diabetic nephropathy: Secondary | ICD-10-CM

## 2022-11-01 DIAGNOSIS — E782 Mixed hyperlipidemia: Secondary | ICD-10-CM | POA: Diagnosis not present

## 2022-11-01 DIAGNOSIS — Z1159 Encounter for screening for other viral diseases: Secondary | ICD-10-CM

## 2022-11-02 LAB — CBC WITH DIFFERENTIAL/PLATELET
Basophils Absolute: 0 10*3/uL (ref 0.0–0.2)
Basos: 1 %
EOS (ABSOLUTE): 0.1 10*3/uL (ref 0.0–0.4)
Eos: 3 %
Hematocrit: 41.1 % (ref 37.5–51.0)
Hemoglobin: 13.6 g/dL (ref 13.0–17.7)
Immature Grans (Abs): 0 10*3/uL (ref 0.0–0.1)
Immature Granulocytes: 0 %
Lymphocytes Absolute: 2.2 10*3/uL (ref 0.7–3.1)
Lymphs: 46 %
MCH: 31.9 pg (ref 26.6–33.0)
MCHC: 33.1 g/dL (ref 31.5–35.7)
MCV: 97 fL (ref 79–97)
Monocytes Absolute: 0.7 10*3/uL (ref 0.1–0.9)
Monocytes: 14 %
Neutrophils Absolute: 1.7 10*3/uL (ref 1.4–7.0)
Neutrophils: 36 %
Platelets: 222 10*3/uL (ref 150–450)
RBC: 4.26 x10E6/uL (ref 4.14–5.80)
RDW: 14.1 % (ref 11.6–15.4)
WBC: 4.8 10*3/uL (ref 3.4–10.8)

## 2022-11-02 LAB — LIPID PANEL
Chol/HDL Ratio: 3.1 ratio (ref 0.0–5.0)
Cholesterol, Total: 129 mg/dL (ref 100–199)
HDL: 41 mg/dL (ref >39)
LDL Chol Calc (NIH): 65 mg/dL (ref 0–99)
Triglycerides: 129 mg/dL (ref 0–149)
VLDL Cholesterol Cal: 23 mg/dL (ref 5–40)

## 2022-11-02 LAB — MICROALBUMIN / CREATININE URINE RATIO
Creatinine, Urine: 366.4 mg/dL
Microalb/Creat Ratio: 4 mg/g creat (ref 0–29)
Microalbumin, Urine: 15 ug/mL

## 2022-11-02 LAB — HEMOGLOBIN A1C
Est. average glucose Bld gHb Est-mCnc: 143 mg/dL
Hgb A1c MFr Bld: 6.6 % — ABNORMAL HIGH (ref 4.8–5.6)

## 2022-11-02 LAB — COMPREHENSIVE METABOLIC PANEL
ALT: 19 IU/L (ref 0–44)
AST: 25 IU/L (ref 0–40)
Albumin/Globulin Ratio: 1.8 (ref 1.2–2.2)
Albumin: 4.4 g/dL (ref 3.8–4.8)
Alkaline Phosphatase: 80 IU/L (ref 44–121)
BUN/Creatinine Ratio: 13 (ref 10–24)
BUN: 16 mg/dL (ref 8–27)
Bilirubin Total: 0.4 mg/dL (ref 0.0–1.2)
CO2: 23 mmol/L (ref 20–29)
Calcium: 9.9 mg/dL (ref 8.6–10.2)
Chloride: 109 mmol/L — ABNORMAL HIGH (ref 96–106)
Creatinine, Ser: 1.2 mg/dL (ref 0.76–1.27)
Globulin, Total: 2.5 g/dL (ref 1.5–4.5)
Glucose: 120 mg/dL — ABNORMAL HIGH (ref 70–99)
Potassium: 4.6 mmol/L (ref 3.5–5.2)
Sodium: 145 mmol/L — ABNORMAL HIGH (ref 134–144)
Total Protein: 6.9 g/dL (ref 6.0–8.5)
eGFR: 63 mL/min/{1.73_m2} (ref >59)

## 2022-11-02 LAB — CARDIOVASCULAR RISK ASSESSMENT

## 2022-11-05 NOTE — Progress Notes (Signed)
Blood count normal.  Liver function normal.  Kidney function normal.  Cholesterol: At goal. HBA1C: 6.6.  Stable. Not spilling protein in urine.

## 2022-11-07 ENCOUNTER — Encounter: Payer: Self-pay | Admitting: Family Medicine

## 2022-11-21 ENCOUNTER — Other Ambulatory Visit: Payer: Self-pay

## 2022-11-21 MED ORDER — SPIRONOLACTONE 25 MG PO TABS: 12.5000 mg | ORAL_TABLET | Freq: Every day | ORAL | 1 refills | Status: DC

## 2022-11-21 MED ORDER — CARVEDILOL 6.25 MG PO TABS: 6.2500 mg | ORAL_TABLET | Freq: Every day | ORAL | 1 refills | Status: DC

## 2023-01-23 ENCOUNTER — Ambulatory Visit (INDEPENDENT_AMBULATORY_CARE_PROVIDER_SITE_OTHER): Payer: Medicare HMO

## 2023-01-23 DIAGNOSIS — Z111 Encounter for screening for respiratory tuberculosis: Secondary | ICD-10-CM

## 2023-01-23 NOTE — Progress Notes (Signed)
TB applied to left foreram.

## 2023-01-25 ENCOUNTER — Ambulatory Visit (INDEPENDENT_AMBULATORY_CARE_PROVIDER_SITE_OTHER): Payer: Medicare HMO

## 2023-01-25 DIAGNOSIS — Z111 Encounter for screening for respiratory tuberculosis: Secondary | ICD-10-CM

## 2023-01-25 LAB — TB SKIN TEST
Induration: 0 mm
TB Skin Test: NEGATIVE

## 2023-02-07 ENCOUNTER — Other Ambulatory Visit: Payer: Self-pay

## 2023-02-07 DIAGNOSIS — E1159 Type 2 diabetes mellitus with other circulatory complications: Secondary | ICD-10-CM

## 2023-02-07 MED ORDER — TRUE METRIX BLOOD GLUCOSE TEST VI STRP: ORAL_STRIP | 3 refills | Status: AC

## 2023-02-07 MED ORDER — TRUEPLUS LANCETS 30G MISC: 3 refills | Status: AC

## 2023-02-10 ENCOUNTER — Other Ambulatory Visit: Payer: Self-pay

## 2023-02-10 DIAGNOSIS — E1159 Type 2 diabetes mellitus with other circulatory complications: Secondary | ICD-10-CM

## 2023-02-10 MED ORDER — DROPSAFE ALCOHOL PREP 70 % PADS: 1.0000 | MEDICATED_PAD | Freq: Two times a day (BID) | 3 refills | Status: AC

## 2023-03-07 ENCOUNTER — Ambulatory Visit: Payer: Medicare HMO | Admitting: Nurse Practitioner

## 2023-03-08 NOTE — Assessment & Plan Note (Addendum)
Control: Controlled Recommend check sugars fasting daily. Recommend check feet daily. Recommend annual eye exams. Medicines: Metformin 500 mg twice a day. Started Jardiance 10 mg daily Continue to work on eating a healthy diet and exercise.  Labs drawn today.

## 2023-03-08 NOTE — Assessment & Plan Note (Signed)
Well controlled.  No changes to medicines. Carvedilol 6.25 mg daily, spironolactone 25 mg daily, Aspirin 81 mg daily Continue to work on eating a healthy diet and exercise.  Labs drawn today.

## 2023-03-08 NOTE — Progress Notes (Unsigned)
Subjective:  Patient ID: Mark [name], male    DOB: [birth year]  Age: 77 y.o. MRN: [id number]  Chief Complaint  Patient presents with   Medical Management of Chronic Issues    HPI Pt presents for follow -up of T2DM, HTN, and Hyperlipidemia.    Diabetes Mellitus Type II, follow-up Last hemoglobin A1c was 6.1.  Patient is not checking his sugars.  Seen for diabetes 3 months ago.  Management since then includes Metformin 500 mg daily.     Most Recent Eye Exam: 2023  Hypertension with congestive heart failure, Cardiomyopathy.  And coronary artery disease follow-up Management since that visit includes Spironolactone 12.5 mg QD, Coreg 6.25 TWICE DAILY, Lasix 20 mg daily.  Patient has not required nitroglycerin. Patient seems confused about his cardiac history.  Patient did have an myocardial infarction in 2010 with 2 stents placed, 1 in the proximal LAD and 1 in the RCA.  Cardiac cath done in 2023 showed a 60 % stenosis of proximal LAD at the stent and 5% stenosis of the proximal RCA stent. Echocardiogram in December 2023 showed an EF of 25 to 30%.    Lipid/Cholesterol, follow-up Management since that visit includes Lipitor . He reports excellent compliance with treatment. He is not having side effects.      03/09/2023    8:01 AM 10/12/2022    8:43 AM 09/05/2022    8:22 AM 12/27/2021    7:48 AM 07/21/2021    7:48 AM  Depression screen PHQ 2/9  Decreased Interest 2 0 0 0 0  Down, Depressed, Hopeless 0 0 0 0 0  PHQ - 2 Score 2 0 0 0 0  Altered sleeping 3      Tired, decreased energy 0      Change in appetite 0      Feeling bad or failure about yourself  0      Trouble concentrating 0      Moving slowly or fidgety/restless 0      Suicidal thoughts 0      PHQ-9 Score 5      Difficult doing work/chores Not difficult at all            03/09/2023    8:01 AM  Fall Risk   Falls in the past year? 0  Number falls in past yr: 0  Injury with Fall? 0  Risk for fall due to : No Fall  Risks  Follow up Falls evaluation completed;Falls prevention discussed    Patient Care Team: Abbegail Matuska, Fritzi Mandes, MD as PCP - General (Family Medicine)   Review of Systems  Constitutional:  Negative for chills and fever.  HENT:  Negative for congestion, rhinorrhea and sore throat.   Respiratory:  Negative for cough and shortness of breath.   Cardiovascular:  Positive for leg swelling. Negative for chest pain and palpitations.  Gastrointestinal:  Positive for abdominal pain. Negative for constipation, diarrhea, nausea and vomiting.  Genitourinary:  Negative for dysuria and urgency.  Musculoskeletal:  Negative for arthralgias, back pain and myalgias.  Neurological:  Negative for dizziness and headaches.  Psychiatric/Behavioral:  Positive for sleep disturbance. Negative for dysphoric mood. The patient is not nervous/anxious.     Current Outpatient Medications on File Prior to Visit  Medication Sig Dispense Refill   albuterol (VENTOLIN HFA) 108 (90 Base) MCG/ACT inhaler Inhale 1-2 puffs into the lungs as needed for wheezing or shortness of breath.     Alcohol Swabs (DROPSAFE ALCOHOL PREP) 70 % PADS 1 each  by Does not apply route 2 (two) times daily. as directed 200 each 3   aspirin 81 MG chewable tablet Chew 81 mg by mouth daily.     atorvastatin (LIPITOR) 20 MG tablet TAKE 1 TABLET EVERY DAY 90 tablet 2   Blood Glucose Calibration (TRUE METRIX LEVEL 1) Low SOLN 1 each by In Vitro route every 30 (thirty) days. 1 each 2   Blood Glucose Monitoring Suppl (TRUE METRIX AIR GLUCOSE METER) w/Device KIT 1 each by Does not apply route in the morning and at bedtime. 1 kit 0   carvedilol (COREG) 6.25 MG tablet Take 1 tablet (6.25 mg total) by mouth daily. 90 tablet 1   cyclobenzaprine (FLEXERIL) 5 MG tablet Take 1 tablet (5 mg total) by mouth at bedtime. 10 tablet 0   furosemide (LASIX) 20 MG tablet TAKE 1 TABLET EVERY OTHER DAY 45 tablet 3   glucose blood (TRUE METRIX BLOOD GLUCOSE TEST) test strip TEST  BLOOD SUGAR IN THE MORNING AND AT BEDTIME AS INSTRUCTED 200 strip 3   Magnesium 500 MG TABS Take 500 mg by mouth in the morning.     metFORMIN (GLUCOPHAGE) 500 MG tablet TAKE 1/2 TABLET TWICE DAILY 90 tablet 1   nitroGLYCERIN (NITROSTAT) 0.4 MG SL tablet DISSOLVE ONE TABLET UNDER TONGUE AS NEEDED FOR CHEST PAIN UP TO 3 TABLETS 25 tablet 11   spironolactone (ALDACTONE) 25 MG tablet Take 0.5 tablets (12.5 mg total) by mouth daily. 45 tablet 1   TRUEplus Lancets 30G MISC TEST BLOOD SUGAR IN THE MORNING AND AT BEDTIME. 200 each 3   No current facility-administered medications on file prior to visit.   Past Medical History:  Diagnosis Date   Abnormal EKG 03/02/2022   Acute renal failure superimposed on stage 3a chronic kidney disease (HCC) 12/08/2020   Arthritis    Atherosclerotic heart disease of native coronary artery with angina pectoris (HCC) 07/20/2017   BMI 32.0-32.9,adult 03/29/2020   Cardiac murmur 03/02/2022   Cardiomyopathy (HCC) 03/16/2022   Cervical spondylosis 01/11/2022   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   ED (erectile dysfunction) 11/27/2019   Essential hypertension    HFrEF (heart failure with reduced ejection fraction) (HCC) 03/16/2022   History of BPH    Hyperlipidemia 07/20/2017   LBBB (left bundle branch block) 12/27/2021   Osteoarthritis of right knee 12/08/2020   Peripheral vascular disease (HCC)    Type 2 diabetes mellitus with vascular disease (HCC)    Past Surgical History:  Procedure Laterality Date   HERNIA REPAIR     INGUINAL HERNIA REPAIR Bilateral    LEFT HEART CATH AND CORONARY ANGIOGRAPHY N/A 03/23/2022   Procedure: LEFT HEART CATH AND CORONARY ANGIOGRAPHY;  Surgeon: Swaziland, Peter M, MD;  Location: [facility];  Service: Cardiovascular;  Laterality: N/A;    Family History  Problem Relation Age of Onset   Cancer Mother    Hypertension Neg Hx    Heart disease Neg Hx    Diabetes Neg Hx    Social History   Socioeconomic History   Marital  status: Single    Spouse name: Not on file   Number of children: 4   Years of education: 63   Highest education level: 12th grade  Occupational History   Occupation: retired   Tobacco Use   Smoking status: Never   Smokeless tobacco: Never  Vaping Use   Vaping status: Never Used  Substance and Sexual Activity   Alcohol use: No   Drug use: No  Sexual activity: Not Currently  Other Topics Concern   Not on file  Social History Narrative   Lives in a hotel and feels safe with his living arrangements.    Social Determinants of Health   Financial Resource Strain: Low Risk  (10/12/2022)   Overall Financial Resource Strain (CARDIA)    Difficulty of Paying Living Expenses: Not very hard  Food Insecurity: No Food Insecurity (10/12/2022)   Hunger Vital Sign    Worried About Running Out of Food in the Last Year: Never true    Ran Out of Food in the Last Year: Never true  Transportation Needs: No Transportation Needs (10/12/2022)   PRAPARE - Administrator, Civil Service (Medical): No    Lack of Transportation (Non-Medical): No  Physical Activity: Sufficiently Active (10/12/2022)   Exercise Vital Sign    Days of Exercise per Week: 4 days    Minutes of Exercise per Session: 60 min  Stress: No Stress Concern Present (10/12/2022)   [employer] of Occupational Health - Occupational Stress Questionnaire    Feeling of Stress : Not at all  Social Connections: Moderately Isolated (10/12/2022)   Social Connection and Isolation Panel [NHANES]    Frequency of Communication with Friends and Family: More than three times a week    Frequency of Social Gatherings with Friends and Family: Twice a week    Attends Religious Services: 1 to 4 times per year    Active Member of [employer] or Organizations: No    Attends Engineer, structural: Never    Marital Status: Never married    Objective:  BP 136/60   Pulse 72   Temp (!) 95.3 F (35.2 C)   Resp 14   Ht 5\' 7"  (1.702 m)   Wt  209 lb (94.8 kg)   BMI 32.73 kg/m      03/09/2023    7:59 AM 10/12/2022    8:38 AM 09/05/2022    8:22 AM  BP/Weight  Systolic BP 136  161  Diastolic BP 60  68  Wt. (Lbs) 209 205 208  BMI 32.73 kg/m2 32.11 kg/m2 32.58 kg/m2    Physical Exam Vitals reviewed.  Constitutional:      Appearance: He is normal weight.  Neck:     Vascular: No carotid bruit.  Cardiovascular:     Rate and Rhythm: Normal rate and regular rhythm.  Pulmonary:     Effort: Pulmonary effort is normal. No respiratory distress.     Breath sounds: Normal breath sounds.  Abdominal:     General: Bowel sounds are normal.     Palpations: Abdomen is soft.     Tenderness: There is no abdominal tenderness.  Neurological:     Mental Status: He is alert and oriented to [place], place, and time.  Psychiatric:        Mood and Affect: Mood normal.        Behavior: Behavior normal.     Diabetic Foot Exam - Simple   Simple Foot Form  03/08/2023  5:35 AM  Visual Inspection No deformities, no ulcerations, no other skin breakdown bilaterally: Yes Sensation Testing Intact to touch and monofilament testing bilaterally: Yes Pulse Check Posterior Tibialis and Dorsalis pulse intact bilaterally: Yes Comments      Lab Results  Component Value Date   WBC 5.4 03/09/2023   HGB 12.7 (L) 03/09/2023   HCT 39.0 03/09/2023   PLT 190 03/09/2023   GLUCOSE 109 (H) 03/09/2023   CHOL 144 03/09/2023  TRIG 90 03/09/2023   HDL 43 03/09/2023   LDLCALC 84 03/09/2023   ALT 13 03/09/2023   AST 20 03/09/2023   NA 142 03/09/2023   K 4.3 03/09/2023   CL 103 03/09/2023   CREATININE 1.20 03/09/2023   BUN 16 03/09/2023   CO2 23 03/09/2023   HGBA1C 6.5 (H) 03/09/2023   MICROALBUR 10 04/07/2021      Assessment & Plan:    Essential hypertension Assessment & Plan: Well controlled.  No changes to medicines. Carvedilol 6.25 mg daily, spironolactone 25 mg daily, Aspirin 81 mg daily Continue to work on eating a healthy diet and  exercise.  Labs drawn today.    Orders: -     CBC with Differential/Platelet -     Comprehensive metabolic panel  Type 2 diabetes mellitus with vascular disease (HCC) Assessment & Plan: Control: Controlled Recommend check sugars fasting daily. Recommend check feet daily. Recommend annual eye exams. Medicines: Metformin 500 mg twice a day. Started Jardiance 10 mg daily Continue to work on eating a healthy diet and exercise.  Labs drawn today.     Orders: -     Hemoglobin A1c -     Empagliflozin; Take 1 tablet (10 mg total) by mouth daily before breakfast.  Mixed hyperlipidemia Assessment & Plan: Well controlled.  No changes to medicines. Lipitor Continue to work on eating a healthy diet and exercise.  Labs drawn today.    Orders: -     Lipid panel  HFrEF (heart failure with reduced ejection fraction) (HCC) Assessment & Plan: The current medical regimen is effective;  continue present plan and medications.    Need for hepatitis C screening test -     Hepatitis C antibody     Meds ordered this encounter  Medications   empagliflozin (JARDIANCE) 10 MG TABS tablet    Sig: Take 1 tablet (10 mg total) by mouth daily before breakfast.    Orders Placed This Encounter  Procedures   CBC with Differential/Platelet   Comprehensive metabolic panel   Hemoglobin A1c   Lipid panel   Hepatitis C antibody     Follow-up: Return in about 4 months (around 07/10/2023) for chronic fasting.   I,Marla I Leal-Borjas,acting as a scribe for Blane Ohara, MD.,have documented all relevant documentation on the behalf of Blane Ohara, MD,as directed by  Blane Ohara, MD while in the presence of Blane Ohara, MD.   An After Visit Summary was printed and given to the patient.  Blane Ohara, MD [facility] [phone]

## 2023-03-09 ENCOUNTER — Ambulatory Visit (INDEPENDENT_AMBULATORY_CARE_PROVIDER_SITE_OTHER): Payer: Medicare HMO | Admitting: Family Medicine

## 2023-03-09 VITALS — BP 136/60 | HR 72 | Temp 95.3°F | Resp 14 | Ht 67.0 in | Wt 209.0 lb

## 2023-03-09 DIAGNOSIS — I10 Essential (primary) hypertension: Secondary | ICD-10-CM

## 2023-03-09 DIAGNOSIS — Z1159 Encounter for screening for other viral diseases: Secondary | ICD-10-CM | POA: Diagnosis not present

## 2023-03-09 DIAGNOSIS — E782 Mixed hyperlipidemia: Secondary | ICD-10-CM | POA: Diagnosis not present

## 2023-03-09 DIAGNOSIS — E1159 Type 2 diabetes mellitus with other circulatory complications: Secondary | ICD-10-CM | POA: Diagnosis not present

## 2023-03-09 DIAGNOSIS — Z7984 Long term (current) use of oral hypoglycemic drugs: Secondary | ICD-10-CM | POA: Diagnosis not present

## 2023-03-09 DIAGNOSIS — I110 Hypertensive heart disease with heart failure: Secondary | ICD-10-CM

## 2023-03-09 DIAGNOSIS — I5020 Unspecified systolic (congestive) heart failure: Secondary | ICD-10-CM | POA: Diagnosis not present

## 2023-03-09 MED ORDER — EMPAGLIFLOZIN 10 MG PO TABS
10.0000 mg | ORAL_TABLET | Freq: Every day | ORAL | Status: DC
Start: 2023-03-09 — End: 2023-12-06

## 2023-03-09 NOTE — Patient Instructions (Addendum)
Melatonin 5 mg at bedtime for sleep.  May increase to 10 mg at bedtime if needed.   Need Tdap and Shingrix at local pharmacy.

## 2023-03-10 NOTE — Assessment & Plan Note (Signed)
Well controlled.  No changes to medicines. Lipitor Continue to work on eating a healthy diet and exercise.  Labs drawn today.

## 2023-03-10 NOTE — Assessment & Plan Note (Signed)
The current medical regimen is effective;  continue present plan and medications.  

## 2023-03-12 ENCOUNTER — Encounter: Payer: Self-pay | Admitting: Family Medicine

## 2023-03-28 ENCOUNTER — Ambulatory Visit: Payer: Medicare HMO | Admitting: Cardiology

## 2023-06-02 ENCOUNTER — Other Ambulatory Visit: Payer: Self-pay | Admitting: Family Medicine

## 2023-06-02 DIAGNOSIS — E1159 Type 2 diabetes mellitus with other circulatory complications: Secondary | ICD-10-CM

## 2023-06-05 ENCOUNTER — Other Ambulatory Visit: Payer: Self-pay

## 2023-06-05 MED ORDER — ATORVASTATIN CALCIUM 20 MG PO TABS: 20.0000 mg | ORAL_TABLET | Freq: Every day | ORAL | 2 refills | Status: DC

## 2023-07-10 NOTE — Assessment & Plan Note (Signed)
Control: Controlled Recommend check sugars fasting daily. Recommend check feet daily. Recommend annual eye exams. Medicines: Metformin 500 mg twice a day. Started Jardiance 10 mg daily Continue to work on eating a healthy diet and exercise.  Labs drawn today.

## 2023-07-10 NOTE — Assessment & Plan Note (Signed)
Well controlled.  No changes to medicines. Lipitor Continue to work on eating a healthy diet and exercise.  Labs drawn today.

## 2023-07-10 NOTE — Progress Notes (Deleted)
Subjective:  Patient ID: Mark [name], male    DOB: 10-21-1945  Age: 77 y.o. MRN: [id number]  Chief Complaint  Patient presents with   Medical Management of Chronic Issues    HPI  Pt presents for follow -up of T2DM, HTN, and Hyperlipidemia.    Diabetes Mellitus Type II, follow-up Last hemoglobin A1c was 6.1.  Patient is not checking his sugars.  Seen for diabetes 3 months ago.  Management since then includes Metformin 500 mg daily.     Most Recent Eye Exam: 2023  Hypertension with congestive heart failure, Cardiomyopathy.  And coronary artery disease follow-up Management since that visit includes Spironolactone 12.5 mg QD, Coreg 6.25 TWICE DAILY, Lasix 20 mg daily.  Patient has not required nitroglycerin. Patient seems confused about his cardiac history.  Patient did have an myocardial infarction in 2010 with 2 stents placed, 1 in the proximal LAD and 1 in the RCA.  Cardiac cath done in 2023 showed a 60 % stenosis of proximal LAD at the stent and 5% stenosis of the proximal RCA stent. Echocardiogram in December 2023 showed an EF of 25 to 30%.    Lipid/Cholesterol, follow-up Management since that visit includes Lipitor . He reports excellent compliance with treatment. He is not having side effects.       03/09/2023    8:01 AM 10/12/2022    8:43 AM 09/05/2022    8:22 AM 12/27/2021    7:48 AM 07/21/2021    7:48 AM  Depression screen PHQ 2/9  Decreased Interest 2 0 0 0 0  Down, Depressed, Hopeless 0 0 0 0 0  PHQ - 2 Score 2 0 0 0 0  Altered sleeping 3      Tired, decreased energy 0      Change in appetite 0      Feeling bad or failure about yourself  0      Trouble concentrating 0      Moving slowly or fidgety/restless 0      Suicidal thoughts 0      PHQ-9 Score 5      Difficult doing work/chores Not difficult at all            03/09/2023    8:01 AM  Fall Risk   Falls in the past year? 0  Number falls in past yr: 0  Injury with Fall? 0  Risk for fall due to : No  Fall Risks  Follow up Falls evaluation completed;Falls prevention discussed    Patient Care Team: Cox, Fritzi Mandes, MD as PCP - General (Family Medicine)   Review of Systems  Current Outpatient Medications on File Prior to Visit  Medication Sig Dispense Refill   albuterol (VENTOLIN HFA) 108 (90 Base) MCG/ACT inhaler Inhale 1-2 puffs into the lungs as needed for wheezing or shortness of breath.     Alcohol Swabs (DROPSAFE ALCOHOL PREP) 70 % PADS 1 each by Does not apply route 2 (two) times daily. as directed 200 each 3   aspirin 81 MG chewable tablet Chew 81 mg by mouth daily.     atorvastatin (LIPITOR) 20 MG tablet Take 1 tablet (20 mg total) by mouth daily. 90 tablet 2   Blood Glucose Calibration (TRUE METRIX LEVEL 1) Low SOLN 1 each by In Vitro route every 30 (thirty) days. 1 each 2   Blood Glucose Monitoring Suppl (TRUE METRIX AIR GLUCOSE METER) w/Device KIT 1 each by Does not apply route in the morning and at bedtime. 1 kit  0   carvedilol (COREG) 6.25 MG tablet TAKE 1 TABLET EVERY DAY 90 tablet 0   cyclobenzaprine (FLEXERIL) 5 MG tablet Take 1 tablet (5 mg total) by mouth at bedtime. 10 tablet 0   empagliflozin (JARDIANCE) 10 MG TABS tablet Take 1 tablet (10 mg total) by mouth daily before breakfast.     furosemide (LASIX) 20 MG tablet TAKE 1 TABLET EVERY OTHER DAY 45 tablet 3   glucose blood (TRUE METRIX BLOOD GLUCOSE TEST) test strip TEST BLOOD SUGAR IN THE MORNING AND AT BEDTIME AS INSTRUCTED 200 strip 3   Magnesium 500 MG TABS Take 500 mg by mouth in the morning.     metFORMIN (GLUCOPHAGE) 500 MG tablet TAKE 1/2 TABLET TWICE DAILY 90 tablet 0   nitroGLYCERIN (NITROSTAT) 0.4 MG SL tablet DISSOLVE ONE TABLET UNDER TONGUE AS NEEDED FOR CHEST PAIN UP TO 3 TABLETS 25 tablet 11   spironolactone (ALDACTONE) 25 MG tablet TAKE 1/2 TABLET EVERY DAY 45 tablet 0   TRUEplus Lancets 30G MISC TEST BLOOD SUGAR IN THE MORNING AND AT BEDTIME. 200 each 3   No current facility-administered medications on  file prior to visit.   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   ED (erectile dysfunction) 11/27/2019   Essential hypertension    Floaters 05/02/2022   HFrEF (heart failure with reduced ejection fraction) (HCC) 03/16/2022   History of BPH    Hyperlipidemia 07/20/2017   LBBB (left bundle branch block) 12/27/2021   Osteoarthritis of right knee 12/08/2020   Peripheral vascular disease (HCC)    Type 2 diabetes mellitus with vascular disease (HCC)    Past Surgical History:  Procedure Laterality Date   HERNIA REPAIR     INGUINAL HERNIA REPAIR Bilateral    LEFT HEART CATH AND CORONARY ANGIOGRAPHY N/A 03/23/2022   Procedure: LEFT HEART CATH AND CORONARY ANGIOGRAPHY;  Surgeon: Swazil[place], Peter M, MD;  Location: [facility];  Service: Cardiovascular;  Laterality: N/A;    Family History  Problem Relation Age of Onset   Cancer Mother    Hypertension Neg Hx    Heart disease Neg Hx    Diabetes Neg Hx    Social History   Socioeconomic History   Marital status: Single    Spouse Mark: Not on file   Number of children: 4   Years of education: 12   Highest education level: 12th grade  Occupational History   Occupation: retired   Tobacco Use   Smoking status: Never   Smokeless tobacco: Never  Vaping Use   Vaping status: Never Used  Substance and Sexual Activity   Alcohol use: No   Drug use: No   Sexual activity: Not Currently  Other Topics Concern   Not on file  Social History Narrative   Lives in a hotel and feels safe with his living arrangements.    Social Determinants of  Health   Financial Resource Strain: Low Risk  (10/12/2022)   Overall Financial Resource Strain (CARDIA)    Difficulty of Paying Living Expenses: Not very hard  Food Insecurity: No Food Insecurity (10/12/2022)   Hunger Vital Sign  Worried About Programme researcher, broadcasting/film/video in the Last Year: Never true    Ran Out of Food in the Last Year: Never true  Transportation Needs: No Transportation Needs (10/12/2022)   PRAPARE - Administrator, Civil Service (Medical): No    Lack of Transportation (Non-Medical): No  Physical Activity: Sufficiently Active (10/12/2022)   Exercise Vital Sign    Days of Exercise per Week: 4 days    Minutes of Exercise per Session: 60 min  Stress: No Stress Concern Present (10/12/2022)   [employer] of Occupational Health - Occupational Stress Questionnaire    Feeling of Stress : Not at all  Social Connections: Moderately Isolated (10/12/2022)   Social Connection and Isolation Panel [NHANES]    Frequency of Communication with Friends and Family: More than three times a week    Frequency of Social Gatherings with Friends and Family: Twice a week    Attends Religious Services: 1 to 4 times per year    Active Member of [employer] or Organizations: No    Attends Banker Meetings: Never    Marital Status: Never married    Objective:  There were no vitals taken for this visit.     03/09/2023    7:59 AM 10/12/2022    8:38 AM 09/05/2022    8:22 AM  BP/Weight  Systolic BP 136  409  Diastolic BP 60  68  Wt. (Lbs) 209 205 208  BMI 32.73 kg/m2 32.11 kg/m2 32.58 kg/m2    Physical Exam  Diabetic Foot Exam - Simple   No data filed      Lab Results  Component Value Date   WBC 5.4 03/09/2023   HGB 12.7 (L) 03/09/2023   HCT 39.0 03/09/2023   PLT 190 03/09/2023   GLUCOSE 109 (H) 03/09/2023   CHOL 144 03/09/2023   TRIG 90 03/09/2023   HDL 43 03/09/2023   LDLCALC 84 03/09/2023   ALT 13 03/09/2023   AST 20 03/09/2023   NA 142 03/09/2023   K 4.3  03/09/2023   CL 103 03/09/2023   CREATININE 1.20 03/09/2023   BUN 16 03/09/2023   CO2 23 03/09/2023   HGBA1C 6.5 (H) 03/09/2023   MICROALBUR 10 04/07/2021      Assessment & Plan:    Type 2 diabetes mellitus with vascular disease (HCC) Assessment & Plan: Control: Controlled Recommend check sugars fasting daily. Recommend check feet daily. Recommend annual eye exams. Medicines: Metformin 500 mg twice a day. Started Jardiance 10 mg daily Continue to work on eating a healthy diet and exercise.  Labs drawn today.      Atherosclerosis of native coronary artery of native heart with angina pectoris [facility]) Assessment & Plan: The current medical regimen is effective;  continue present plan and medications.    Diabetic glomerulopathy (HCC) Assessment & Plan: Control:  Recommend check sugars fasting daily. Recommend check feet daily. Recommend annual eye exams. Medicines: Jardiance 10 mg daily, metformin 500 mg twice a day Continue to work on eating a healthy diet and exercise.  Labs drawn today.      Mixed hyperlipidemia Assessment & Plan: Well controlled.  No changes to medicines. Lipitor Continue to work on eating a healthy diet and exercise.  Labs drawn today.        No orders of the defined types were placed in this encounter.   No orders of the defined types were placed in this encounter.    Follow-up: No follow-ups on file.  I,Dillion Stowers I Leal-Borjas,acting as a scribe for Blane Ohara, MD.,have documented all relevant documentation on the behalf of Blane Ohara, MD,as directed by  Blane Ohara, MD while in the presence of Blane Ohara, MD.   An After Visit Summary was printed and given to the patient.  Blane Ohara, MD [facility] [phone]

## 2023-07-10 NOTE — Assessment & Plan Note (Signed)
Control:  Recommend check sugars fasting daily. Recommend check feet daily. Recommend annual eye exams. Medicines: Jardiance 10 mg daily, metformin 500 mg twice a day Continue to work on eating a healthy diet and exercise.  Labs drawn today.

## 2023-07-10 NOTE — Assessment & Plan Note (Signed)
The current medical regimen is effective;  continue present plan and medications.  

## 2023-07-11 ENCOUNTER — Encounter (INDEPENDENT_AMBULATORY_CARE_PROVIDER_SITE_OTHER): Payer: Medicare HMO | Admitting: Family Medicine

## 2023-07-11 DIAGNOSIS — I25119 Atherosclerotic heart disease of native coronary artery with unspecified angina pectoris: Secondary | ICD-10-CM

## 2023-07-11 DIAGNOSIS — E1121 Type 2 diabetes mellitus with diabetic nephropathy: Secondary | ICD-10-CM

## 2023-07-11 DIAGNOSIS — E1159 Type 2 diabetes mellitus with other circulatory complications: Secondary | ICD-10-CM

## 2023-07-11 DIAGNOSIS — E782 Mixed hyperlipidemia: Secondary | ICD-10-CM

## 2023-07-15 NOTE — Progress Notes (Signed)
No show. Dr. Sedalia Muta

## 2023-08-21 ENCOUNTER — Other Ambulatory Visit: Payer: Self-pay | Admitting: Family Medicine

## 2023-08-21 DIAGNOSIS — E1159 Type 2 diabetes mellitus with other circulatory complications: Secondary | ICD-10-CM

## 2023-08-25 ENCOUNTER — Encounter (HOSPITAL_COMMUNITY): Payer: Self-pay | Admitting: Emergency Medicine

## 2023-08-25 ENCOUNTER — Ambulatory Visit (HOSPITAL_COMMUNITY)
Admission: EM | Admit: 2023-08-25 | Discharge: 2023-08-25 | Disposition: A | Discharge status: Home / Self Care | Payer: Medicare HMO | Attending: Emergency Medicine | Admitting: Emergency Medicine

## 2023-08-25 ENCOUNTER — Ambulatory Visit (HOSPITAL_COMMUNITY)
Admission: RE | Admit: 2023-08-25 | Discharge: 2023-08-25 | Disposition: A | Discharge status: Home / Self Care | Payer: Medicare HMO | Source: Ambulatory Visit

## 2023-08-25 ENCOUNTER — Other Ambulatory Visit: Payer: Self-pay

## 2023-08-25 ENCOUNTER — Ambulatory Visit (HOSPITAL_COMMUNITY)
Admission: RE | Admit: 2023-08-25 | Discharge: 2023-08-25 | Disposition: A | Discharge status: Home / Self Care | Payer: Medicare HMO | Source: Ambulatory Visit | Attending: Sports Medicine | Admitting: Sports Medicine

## 2023-08-25 DIAGNOSIS — R051 Acute cough: Secondary | ICD-10-CM | POA: Diagnosis not present

## 2023-08-25 DIAGNOSIS — R52 Pain, unspecified: Secondary | ICD-10-CM | POA: Insufficient documentation

## 2023-08-25 DIAGNOSIS — R062 Wheezing: Secondary | ICD-10-CM | POA: Diagnosis not present

## 2023-08-25 DIAGNOSIS — R059 Cough, unspecified: Secondary | ICD-10-CM | POA: Diagnosis not present

## 2023-08-25 DIAGNOSIS — R0989 Other specified symptoms and signs involving the circulatory and respiratory systems: Secondary | ICD-10-CM | POA: Diagnosis not present

## 2023-08-25 MED ORDER — BENZONATATE 100 MG PO CAPS: 100.0000 mg | ORAL_CAPSULE | Freq: Three times a day (TID) | ORAL | 0 refills | Status: DC | PRN

## 2023-08-25 MED ORDER — BACLOFEN 5 MG PO TABS: 5.0000 mg | ORAL_TABLET | Freq: Two times a day (BID) | ORAL | 0 refills | Status: DC | PRN

## 2023-08-25 NOTE — ED Notes (Signed)
 Patient was sent to hospital for an xray, instructions per another staff member.

## 2023-08-25 NOTE — ED Triage Notes (Signed)
 Pain in abdomen/chest with coughing  Has not had any medications

## 2023-08-25 NOTE — ED Triage Notes (Signed)
 General body aches .  Patient also reports when he coughs he has water stool, incontinent.  Patient says diarrhea started today.

## 2023-08-25 NOTE — Discharge Instructions (Signed)
 Please go down to the emergency department. Tell them you are there for an xray. Do not check in! You only need to have imaging done. After imaging is complete you can go home. I will call you if the xray shows any abnormality requiring treatment.   You can take the muscle relaxer (Baclofen ) twice daily. If the medication makes you drowsy, take only at bed time. This may help with body aches  The tessalon  cough pills can be taken 3x daily. If this medication makes you drowsy, take only one pill before bed.  Please drink lots of fluids! Bland diet if tolerated.

## 2023-08-25 NOTE — ED Provider Notes (Addendum)
 [facility]    CSN: [id number] Arrival date & time: 08/25/23  1129      History   Chief Complaint Chief Complaint  Patient presents with   Generalized Body Aches    HPI Mark [name] is a 78 y.o. male.  Here with 3 day history of body aches and cough Reports abdomen and chest discomfort with cough No shortness of breath Diarrhea began today. 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   ED (erectile dysfunction) 11/27/2019   Essential hypertension    Floaters 05/02/2022   HFrEF (heart failure with reduced ejection fraction) (HCC) 03/16/2022   History of BPH    Hyperlipidemia 07/20/2017   LBBB (left bundle branch block) 12/27/2021   Osteoarthritis of right knee 12/08/2020   Peripheral vascular disease (HCC)    Type 2 diabetes mellitus with vascular disease [facility])     Patient Active Problem List   Diagnosis Date Noted   HFrEF (heart failure with reduced ejection fraction) (HCC) 03/16/2022   Cardiomyopathy (HCC) 03/16/2022   Cardiac murmur 03/02/2022   Arthritis 01/24/2022   Cervical spondylosis 01/11/2022   LBBB (left bundle branch block) 12/27/2021   Acute renal failure superimposed on stage 3a chronic kidney disease (HCC) 12/08/2020   Osteoarthritis of right knee 12/08/2020   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   ED (erectile dysfunction) 11/27/2019   Type 2 diabetes mellitus with vascular disease (HCC)    Essential hypertension  07/20/2017   Atherosclerotic heart disease of native coronary artery with angina pectoris (HCC) 07/20/2017   Peripheral vascular disease (HCC) 07/20/2017   History of BPH 07/20/2017   Hyperlipidemia 07/20/2017    Past Surgical History:  Procedure Laterality Date   HERNIA REPAIR     INGUINAL HERNIA REPAIR Bilateral    LEFT HEART CATH AND CORONARY ANGIOGRAPHY N/A 03/23/2022   Procedure: LEFT HEART CATH AND CORONARY ANGIOGRAPHY;  Surgeon: Jordan, Peter M, MD;  Location: [facility] INVASIVE CV LAB;  Service: Cardiovascular;  Laterality: N/A;       Home Medications    Prior to Admission medications   Medication Sig Start Date End Date Taking? Authorizing Provider  Baclofen  5 MG TABS Take 1 tablet (5 mg total) by mouth 2 (two) times daily as needed. 08/25/23  Yes Hamlet Lasecki, [place]berry, PA-C  benzonatate  (TESSALON ) 100 MG capsule Take 1 capsule (100 mg total) by mouth 3 (three) times daily as needed for cough. 08/25/23  Yes Nicholette Dolson, Asberry, PA-C  albuterol  (VENTOLIN  HFA) 108 (90 Base) MCG/ACT inhaler Inhale 1-2 puffs into the lungs as needed for wheezing or shortness of breath. 06/23/19   [provider]  Alcohol  Swabs  (DROPSAFE ALCOHOL  PREP) 70 % PADS 1 each by Does not apply route 2 (two) times daily. as directed 02/10/23   CoxAbigail, MD  aspirin  81 MG chewable tablet Chew 81 mg by mouth daily.    [provider]  atorvastatin  (LIPITOR) 20 MG tablet Take 1 tablet (20 mg total) by mouth daily. 06/05/23   Sherre Abigail, MD  Blood Glucose Calibration (TRUE METRIX LEVEL 1) Low SOLN 1 each by In Vitro route every 30 (thirty) days. 11/07/21   Abran Jerilynn Loving, MD  Blood Glucose Monitoring Suppl (TRUE METRIX AIR GLUCOSE METER) w/Device KIT 1 each by Does not apply route in the morning and at bedtime. 11/03/21   Abran Jerilynn Loving, MD  carvedilol  (COREG ) 6.25 MG tablet TAKE 1 TABLET EVERY DAY 08/22/23   CoxAbigail, MD  cyclobenzaprine  (FLEXERIL ) 5 MG tablet Take 1 tablet (5 mg total) by  mouth at bedtime. 01/13/22   Geiple, Joshua, PA-C  empagliflozin  (JARDIANCE ) 10 MG TABS tablet Take 1 tablet (10 mg total) by mouth daily before breakfast. 03/09/23   Cox, Kirsten, MD  furosemide (LASIX) 20 MG tablet TAKE 1 TABLET EVERY OTHER DAY 10/12/22   Cox, Kirsten, MD  glucose blood (TRUE METRIX BLOOD GLUCOSE TEST) test strip TEST BLOOD SUGAR IN THE MORNING AND AT BEDTIME AS INSTRUCTED 02/07/23   CoxAbigail, MD  Magnesium 500 MG TABS Take 500 mg by mouth in the morning.    [provider]  metFORMIN  (GLUCOPHAGE ) 500 MG tablet TAKE 1/2 TABLET TWICE DAILY Patient not taking: Reported on 08/25/2023 08/22/23   Sherre Abigail, MD  nitroGLYCERIN  (NITROSTAT ) 0.4 MG SL tablet DISSOLVE ONE TABLET UNDER TONGUE AS NEEDED FOR CHEST PAIN UP TO 3 TABLETS 05/02/22   Abran Jerilynn Loving, MD  spironolactone  (ALDACTONE ) 25 MG tablet TAKE 1/2 TABLET EVERY DAY 08/22/23   CoxAbigail, MD  TRUEplus Lancets 30G MISC TEST BLOOD SUGAR IN THE MORNING AND AT BEDTIME. 02/07/23   CoxAbigail, MD    Family History Family History  Problem Relation Age of Onset   Cancer Mother    Hypertension Neg Hx    Heart disease Neg Hx    Diabetes Neg Hx     Social History Social History   Tobacco Use   Smoking status: Never   Smokeless tobacco: Never  Vaping Use   Vaping status: Never Used  Substance Use Topics   Alcohol  use: No   Drug use: No     Allergies   Penicillins   Review of Systems Review of Systems Per HPI  Physical Exam Triage Vital Signs ED Triage Vitals  Encounter Vitals Group     BP 08/25/23 1150 114/66     Systolic BP Percentile --      Diastolic BP Percentile --      Pulse Rate 08/25/23 1150 82     Resp 08/25/23 1150 20     Temp 08/25/23 1150 98.3 F (36.8 C)     Temp Source 08/25/23 1150 Oral     SpO2 08/25/23 1150 91 %     Weight --      Height --      Head Circumference --      Peak Flow --      Pain Score 08/25/23 1147 5     Pain Loc --      Pain Education --       Exclude from Growth Chart --    No data found.  Updated Vital Signs BP 114/66 (BP Location: Right Arm) Comment (BP Location): large cuff  Pulse 82   Temp 98.3 F (36.8 C) (Oral)   Resp 20   SpO2 96%   Physical Exam Vitals and nursing note reviewed.  Constitutional:      Appearance: He is not ill-appearing.  HENT:     Nose: No rhinorrhea.     Mouth/Throat:  Mouth: Mucous membranes are moist.     Pharynx: Oropharynx is clear. No posterior oropharyngeal erythema.  Eyes:     Conjunctiva/sclera: Conjunctivae normal.  Cardiovascular:     Rate and Rhythm: Normal rate and regular rhythm.     Pulses: Normal pulses.     Heart sounds: Normal heart sounds.  Pulmonary:     Effort: Pulmonary effort is normal.     Breath sounds: Wheezing present.     Comments: Faint wheeze and crackles throughout. Faint sounds lower lobes Abdominal:     General: There is no distension.     Palpations: Abdomen is soft.     Tenderness: There is no abdominal tenderness.     Comments: Large belly habitus   Musculoskeletal:     Cervical back: Normal range of motion.  Lymphadenopathy:     Cervical: No cervical adenopathy.  Skin:    General: Skin is warm and dry.  Neurological:     Mental Status: He is alert and oriented to [place], place, and time.      UC Treatments / Results  Labs (all labs ordered are listed, but only abnormal results are displayed) Labs Reviewed - No data to display  EKG   Radiology DG Chest 2 View Result Date: 08/25/2023 CLINICAL DATA:  cough x 3 days with wheeze and rales EXAM: CHEST - 2 VIEW COMPARISON:  08/01/2022 FINDINGS: Stable bibasilar atelectasis or scarring. No new infiltrate or overt edema. Heart size and mediastinal contours are within normal limits. Aortic Atherosclerosis (ICD10-170.0). No effusion. Visualized bones unremarkable. IMPRESSION: Bibasilar atelectasis or scarring. No acute findings. Electronically Signed   By: JONETTA Faes M.D.   On: 08/25/2023 13:27     Procedures Procedures (including critical care time)  Medications Ordered in UC Medications - No data to display  Initial Impression / Assessment and Plan / UC Course  I have reviewed the triage vital signs and the nursing notes.  Pertinent labs & imaging results that were available during my care of the patient were reviewed by me and considered in my medical decision making (see chart for details).  Afebrile, sating 96% room air Faint wheeze heard on exam with crackles. With PMH, will obtain xray. Not available on site today. Sent to hospital for imaging. Will contact patient with results and treatment as needed. In the meantime discussed symptomatic care, tylenol , try baclofen  for muscle aches with drowsy precautions, tessalon  TID prn. Fluids and bland diet. Advised return and ED precautions. Patient without questions at this time   ADDENDUM Xray results with bibasilar atelectasis or scarring. No change to treatment plan at this time  Final Clinical Impressions(s) / UC Diagnoses   Final diagnoses:  Acute cough  Body aches     Discharge Instructions      Please go down to the emergency department. Tell them you are there for an xray. Do not check in! You only need to have imaging done. After imaging is complete you can go home. I will call you if the xray shows any abnormality requiring treatment.   You can take the muscle relaxer (Baclofen ) twice daily. If the medication makes you drowsy, take only at bed time. This may help with body aches  The tessalon  cough pills can be taken 3x daily. If this medication makes you drowsy, take only one pill before bed.  Please drink lots of fluids! Bland diet if tolerated.       ED Prescriptions     Medication Sig Dispense Auth. Provider  Baclofen  5 MG TABS Take 1 tablet (5 mg total) by mouth 2 (two) times daily as needed. 20 tablet Laylani Pudwill, PA-C   benzonatate  (TESSALON ) 100 MG capsule Take 1 capsule (100 mg total)  by mouth 3 (three) times daily as needed for cough. 30 capsule Makhiya Coburn, Asberry, PA-C      PDMP not reviewed this encounter.   Tiffanie Blassingame, Asberry RIGGERS 08/25/23 1230    Luvena Wentling, Asberry, PA-C 08/25/23 1332

## 2023-08-27 ENCOUNTER — Ambulatory Visit: Payer: Self-pay | Admitting: General Practice

## 2023-08-27 DIAGNOSIS — I5022 Chronic systolic (congestive) heart failure: Secondary | ICD-10-CM | POA: Diagnosis not present

## 2023-08-27 DIAGNOSIS — R079 Chest pain, unspecified: Secondary | ICD-10-CM | POA: Diagnosis not present

## 2023-08-27 DIAGNOSIS — Z7982 Long term (current) use of aspirin: Secondary | ICD-10-CM | POA: Diagnosis not present

## 2023-08-27 DIAGNOSIS — I130 Hypertensive heart and chronic kidney disease with heart failure and stage 1 through stage 4 chronic kidney disease, or unspecified chronic kidney disease: Secondary | ICD-10-CM | POA: Diagnosis not present

## 2023-08-27 DIAGNOSIS — Z79899 Other long term (current) drug therapy: Secondary | ICD-10-CM | POA: Diagnosis not present

## 2023-08-27 DIAGNOSIS — J9811 Atelectasis: Secondary | ICD-10-CM | POA: Diagnosis not present

## 2023-08-27 DIAGNOSIS — R062 Wheezing: Secondary | ICD-10-CM | POA: Diagnosis not present

## 2023-08-27 DIAGNOSIS — I5020 Unspecified systolic (congestive) heart failure: Secondary | ICD-10-CM | POA: Diagnosis not present

## 2023-08-27 DIAGNOSIS — E1151 Type 2 diabetes mellitus with diabetic peripheral angiopathy without gangrene: Secondary | ICD-10-CM | POA: Diagnosis not present

## 2023-08-27 DIAGNOSIS — E119 Type 2 diabetes mellitus without complications: Secondary | ICD-10-CM | POA: Diagnosis not present

## 2023-08-27 DIAGNOSIS — J101 Influenza due to other identified influenza virus with other respiratory manifestations: Secondary | ICD-10-CM | POA: Diagnosis not present

## 2023-08-27 DIAGNOSIS — I110 Hypertensive heart disease with heart failure: Secondary | ICD-10-CM | POA: Diagnosis not present

## 2023-08-27 DIAGNOSIS — I252 Old myocardial infarction: Secondary | ICD-10-CM | POA: Diagnosis not present

## 2023-08-27 DIAGNOSIS — N189 Chronic kidney disease, unspecified: Secondary | ICD-10-CM | POA: Diagnosis not present

## 2023-08-27 DIAGNOSIS — E785 Hyperlipidemia, unspecified: Secondary | ICD-10-CM | POA: Diagnosis not present

## 2023-08-27 NOTE — Telephone Encounter (Signed)
 Copied from CRM [id number]. Topic: Clinical - Red Word Triage >> Aug 27, 2023 10:35 AM Mark [name] ORN wrote: Red Word that prompted transfer to Nurse Triage: chest pain , difficulty breathing , nose running , when cough stomach pain   Chief Complaint: Chest Pain with Coughing Symptoms: Coughing, Dyspnea, Abdominal Pain Frequency: Since Thursday Pertinent Negatives: Patient denies diaphoresis, persistent dizziness Disposition: [x] ED /[] Urgent Care (no appt availability in office) / [] Appointment(In office/virtual)/ []  [facility]/ [] Home Care/ [] Refused Recommended Disposition /[] [facility] [facility]/ []  Follow-up with PCP Additional Notes: Mark [name] is a 78 year old male being triaged for chest pain with coughing and shortness of breath. The patient has a cardiac and respiratory history. Reports a productive cough, with brown tints in the phlegm. The patient has diagnosed asthma and a persistent cough during the triage call. Referred patient to nearest ED, Patient verbalized understanding.     Answer Assessment - Initial Assessment Questions 1. LOCATION: Where does it hurt?       Lower Chest  2. RADIATION: Does the pain go anywhere else? (e.g., into neck, jaw, arms, back)     Abdomen  3. ONSET: When did the chest pain begin? (Minutes, hours or days)      Thursday  4. PATTERN: Does the pain come and go, or has it been constant since it started?  Does it get worse with exertion?      Comes and Goes  5. DURATION: How long does it last (e.g., seconds, minutes, hours)     Only during coughing  6. SEVERITY: How bad is the pain?  (e.g., Scale 1-10; mild, moderate, or severe)    - MILD (1-3): doesn't interfere with normal activities     - MODERATE (4-7): interferes with normal activities or awakens from sleep    - SEVERE (8-10): excruciating pain, unable to do any normal activities       7  7. CARDIAC RISK FACTORS: Do you have any history of heart problems or risk  factors for heart disease? (e.g., angina, prior heart attack; diabetes, high blood pressure, high cholesterol, smoker, or strong family history of heart disease)     Heart Attack, 2010  8. PULMONARY RISK FACTORS: Do you have any history of lung disease?  (e.g., blood clots in lung, asthma, emphysema, birth control pills)     Asthma   9. CAUSE: What do you think is causing the chest pain?     Coughing  10. OTHER SYMPTOMS: Do you have any other symptoms? (e.g., dizziness, nausea, vomiting, sweating, fever, difficulty breathing, cough)       Difficulty Breathing, Runny Nose  Protocols used: Chest Pain-A-AH

## 2023-08-28 ENCOUNTER — Encounter: Payer: Self-pay | Admitting: Family Medicine

## 2023-08-28 ENCOUNTER — Ambulatory Visit (INDEPENDENT_AMBULATORY_CARE_PROVIDER_SITE_OTHER): Payer: Medicare HMO | Admitting: Family Medicine

## 2023-08-28 VITALS — BP 118/68 | HR 70 | Temp 98.6°F | Ht 67.0 in | Wt 202.0 lb

## 2023-08-28 DIAGNOSIS — J111 Influenza due to unidentified influenza virus with other respiratory manifestations: Secondary | ICD-10-CM | POA: Diagnosis not present

## 2023-08-28 NOTE — Progress Notes (Signed)
 Subjective:  Patient ID: Mark [name], male    DOB: Apr 02, 1946  Age: 78 y.o. MRN: [id number]  Chief Complaint  Patient presents with   Follow-up    HPI The patient, with a history of diabetes, hyperlipidemia, and hypertension , who initially presented to an urgent care center with severe coughing and abdominal pain. The coughing was so severe that it caused muscle soreness in the abdomen. The patient also reported flu-like symptoms, including body aches, fever, headache, and dizziness. Influenza positive. Was rx'd tamiflu, a inhaler, prednisone, tessalon  pearls and baclofen . Symptoms started last Thursday. The patient was sent from the [facility] emergency department for x-rays, which initially appeared normal. However, the next day, the patient was called back due to an abnormality in the x-ray. The patient chose to go to a different hospital< [facility], for further evaluation. The patient has been prescribed several medications, including Tamiflu, a cough medicine, a muscle relaxant, and an inhaler. The patient also reports taking regular medication for an unspecified condition.      03/09/2023    8:01 AM 10/12/2022    8:43 AM 09/05/2022    8:22 AM 12/27/2021    7:48 AM 07/21/2021    7:48 AM  Depression screen PHQ 2/9  Decreased Interest 2 0 0 0 0  Down, Depressed, Hopeless 0 0 0 0 0  PHQ - 2 Score 2 0 0 0 0  Altered sleeping 3      Tired, decreased energy 0      Change in appetite 0      Feeling bad or failure about yourself  0      Trouble concentrating 0      Moving slowly or fidgety/restless 0      Suicidal thoughts 0      PHQ-9 Score 5      Difficult doing work/chores Not difficult at all            03/09/2023    8:01 AM  Fall Risk   Falls in the past year? 0  Number falls in past yr: 0  Injury with Fall? 0  Risk for fall due to : No Fall Risks  Follow up Falls evaluation completed;Falls prevention discussed    Patient Care Team: Sirivol, Mamatha, MD as PCP -  General (Family Medicine)   Review of Systems  Constitutional:  Negative for chills, diaphoresis, fatigue and fever.  HENT:  Negative for congestion, ear pain and sore throat.   Respiratory:  Positive for cough and shortness of breath.   Cardiovascular:  Negative for chest pain and leg swelling.  Gastrointestinal:  Negative for abdominal pain, constipation, diarrhea, nausea and vomiting.    No current facility-administered medications on file prior to visit.   Current Outpatient Medications on File Prior to Visit  Medication Sig Dispense Refill   albuterol  (VENTOLIN  HFA) 108 (90 Base) MCG/ACT inhaler Inhale 1-2 puffs into the lungs as needed for wheezing or shortness of breath.     Alcohol  Swabs  (DROPSAFE ALCOHOL  PREP) 70 % PADS 1 each by Does not apply route 2 (two) times daily. as directed 200 each 3   aspirin  81 MG chewable tablet Chew 81 mg by mouth daily.     atorvastatin  (LIPITOR) 20 MG tablet Take 1 tablet (20 mg total) by mouth daily. 90 tablet 2   Baclofen  5 MG TABS Take 1 tablet (5 mg total) by mouth 2 (two) times daily as needed. 20 tablet 0   Blood Glucose Calibration (TRUE METRIX  LEVEL 1) Low SOLN 1 each by In Vitro route every 30 (thirty) days. 1 each 2   Blood Glucose Monitoring Suppl (TRUE METRIX AIR GLUCOSE METER) w/Device KIT 1 each by Does not apply route in the morning and at bedtime. 1 kit 0   carvedilol  (COREG ) 6.25 MG tablet TAKE 1 TABLET EVERY DAY 90 tablet 0   empagliflozin  (JARDIANCE ) 10 MG TABS tablet Take 1 tablet (10 mg total) by mouth daily before breakfast.     furosemide (LASIX) 20 MG tablet TAKE 1 TABLET EVERY OTHER DAY 45 tablet 3   glucose blood (TRUE METRIX BLOOD GLUCOSE TEST) test strip TEST BLOOD SUGAR IN THE MORNING AND AT BEDTIME AS INSTRUCTED 200 strip 3   Magnesium 500 MG TABS Take 500 mg by mouth in the morning.     metFORMIN  (GLUCOPHAGE ) 500 MG tablet TAKE 1/2 TABLET TWICE DAILY (Patient not taking: Reported on 08/25/2023) 90 tablet 0    nitroGLYCERIN  (NITROSTAT ) 0.4 MG SL tablet DISSOLVE ONE TABLET UNDER TONGUE AS NEEDED FOR CHEST PAIN UP TO 3 TABLETS 25 tablet 11   spironolactone  (ALDACTONE ) 25 MG tablet TAKE 1/2 TABLET EVERY DAY 45 tablet 0   TRUEplus Lancets 30G MISC TEST BLOOD SUGAR IN THE MORNING AND AT BEDTIME. 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   ED (erectile dysfunction) 11/27/2019   Essential hypertension    Floaters 05/02/2022   HFrEF (heart failure with reduced ejection fraction) (HCC) 03/16/2022   History of BPH    Hyperlipidemia 07/20/2017   LBBB (left bundle branch block) 12/27/2021   Osteoarthritis of right knee 12/08/2020   Peripheral vascular disease (HCC)    Type 2 diabetes mellitus with vascular disease (HCC)    Past Surgical History:  Procedure Laterality Date   HERNIA REPAIR     INGUINAL HERNIA REPAIR Bilateral    LEFT HEART CATH AND CORONARY ANGIOGRAPHY N/A 03/23/2022   Procedure: LEFT HEART CATH AND CORONARY ANGIOGRAPHY;  Surgeon: Jordan, Peter M, MD;  Location: [facility] INVASIVE CV LAB;  Service: Cardiovascular;  Laterality: N/A;    Family History  Problem Relation Age of Onset   Cancer Mother    Hypertension Neg Hx    Heart disease Neg Hx    Diabetes Neg Hx    Social History   Socioeconomic History   Marital status: Single    Spouse name: Not on file   Number of children: 4   Years of education: 12   Highest education level: 12th grade  Occupational History   Occupation: retired   Tobacco Use   Smoking status: Never   Smokeless tobacco: Never  Vaping Use   Vaping  status: Never Used  Substance and Sexual Activity   Alcohol  use: No   Drug use: No   Sexual activity: Not Currently  Other Topics Concern   Not on file  Social History Narrative   Lives in a hotel and feels safe with his living arrangements.    Social Drivers of Health   Financial Resource Strain: Low Risk  (10/12/2022)   Overall Financial Resource Strain (CARDIA)    Difficulty of Paying  Living Expenses: Not very hard  Food Insecurity: No Food Insecurity (10/12/2022)   Hunger Vital Sign    Worried About Running Out of Food in the Last Year: Never true    Ran Out of Food in the Last Year: Never true  Transportation Needs: No Transportation Needs (10/12/2022)   PRAPARE - Administrator, Civil Service (Medical): No    Lack of Transportation (Non-Medical): No  Physical Activity: Sufficiently Active (10/12/2022)   Exercise Vital Sign    Days of Exercise per Week: 4 days    Minutes of Exercise per Session: 60 min  Stress: No Stress Concern Present (10/12/2022)   [employer] of Occupational Health - Occupational Stress Questionnaire    Feeling of Stress : Not at all  Social Connections: Moderately Isolated (10/12/2022)   Social Connection and Isolation Panel [NHANES]    Frequency of Communication with Friends and Family: More than three times a week    Frequency of Social Gatherings with Friends and Family: Twice a week    Attends Religious Services: 1 to 4 times per year    Active Member of [employer] or Organizations: No    Attends Engineer, Structural: Never    Marital Status: Never married    Objective:  BP 118/68   Pulse 70   Temp 98.6 F (37 C)   Ht 5' 7 (1.702 m)   Wt 202 lb (91.6 kg)   SpO2 95%   BMI 31.64 kg/m      09/02/2023    1:39 PM 08/31/2023    8:54 AM 08/28/2023    3:30 PM  BP/Weight  Systolic BP 122 118 118  Diastolic BP 75 76 68  Wt. (Lbs)   202  BMI   31.64 kg/m2    Physical Exam Constitutional:      Appearance: Normal  appearance.  HENT:     Right Ear: Tympanic membrane, ear canal and external ear normal.     Left Ear: Tympanic membrane, ear canal and external ear normal.     Nose: Nose normal. No congestion or rhinorrhea.     Mouth/Throat:     Mouth: Mucous membranes are moist.     Pharynx: No oropharyngeal exudate or posterior oropharyngeal erythema.  Cardiovascular:     Rate and Rhythm: Normal rate and regular rhythm.     Heart sounds: Normal heart sounds.  Pulmonary:     Effort: Pulmonary effort is normal. No respiratory distress.     Breath sounds: Wheezing (BL) present. No rhonchi or rales.  Lymphadenopathy:     Cervical: No cervical adenopathy.  Neurological:     Mental Status: He is alert.  Psychiatric:        Mood and Affect: Mood normal.        Behavior: Behavior normal.     Diabetic Foot Exam - Simple   No data filed      Lab Results  Component Value Date   WBC 7.4 09/02/2023   HGB 14.8 09/02/2023   HCT 45.2 09/02/2023   PLT 314 09/02/2023   GLUCOSE 107 (H) 09/02/2023   CHOL 144 03/09/2023   TRIG 90 03/09/2023   HDL 43 03/09/2023   LDLCALC 84 03/09/2023   ALT 13 03/09/2023   AST 20 03/09/2023   NA 143 09/02/2023   K 3.9 09/02/2023   CL 107 09/02/2023   CREATININE 1.36 (H) 09/02/2023   BUN 31 (H) 09/02/2023   CO2 29 09/02/2023   HGBA1C 6.5 (H) 03/09/2023  MICROALBUR 10 04/07/2021      Assessment & Plan:    Influenzal bronchitis Assessment & Plan: Education given.  Complete influenza and prednisone. Continue push fluids and rest.       No orders of the defined types were placed in this encounter.   No orders of the defined types were placed in this encounter.    Follow-up: Return if symptoms worsen or fail to improve.  Total time spent on today's visit was greater than 20 minutes, including both face-to-face time and nonface-to-face time personally spent on review of chart (labs and imaging), discussing labs and goals, discussing further work-up,  treatment options, referrals to specialist if needed, reviewing outside records of pertinent, answering patient's questions, and coordinating care.  I,Katherina A Bramblett,acting as a scribe for Abigail Free, MD.,have documented all relevant documentation on the behalf of Abigail Free, MD,as directed by  Abigail Free, MD while in the presence of Abigail Free, MD.   An After Visit Summary was printed and given to the patient.  I attest that I have reviewed this visit and agree with the plan scribed by my staff.   Abigail Free, MD [facility] [phone]

## 2023-08-31 ENCOUNTER — Ambulatory Visit: Payer: Self-pay

## 2023-08-31 ENCOUNTER — Encounter (HOSPITAL_BASED_OUTPATIENT_CLINIC_OR_DEPARTMENT_OTHER): Payer: Self-pay

## 2023-08-31 ENCOUNTER — Ambulatory Visit (HOSPITAL_BASED_OUTPATIENT_CLINIC_OR_DEPARTMENT_OTHER)
Admission: RE | Admit: 2023-08-31 | Discharge: 2023-08-31 | Disposition: A | Discharge status: Home / Self Care | Payer: Medicare HMO | Source: Ambulatory Visit | Attending: Emergency Medicine

## 2023-08-31 VITALS — BP 118/76 | HR 58 | Temp 97.8°F | Resp 20

## 2023-08-31 DIAGNOSIS — J101 Influenza due to other identified influenza virus with other respiratory manifestations: Secondary | ICD-10-CM | POA: Diagnosis not present

## 2023-08-31 DIAGNOSIS — R051 Acute cough: Secondary | ICD-10-CM | POA: Diagnosis not present

## 2023-08-31 MED ORDER — DEXAMETHASONE SODIUM PHOSPHATE 10 MG/ML IJ SOLN
10.0000 mg | Freq: Once | INTRAMUSCULAR | Status: AC
  Administered 2023-08-31: 10 mg via INTRAMUSCULAR

## 2023-08-31 MED ORDER — BENZONATATE 200 MG PO CAPS: 200.0000 mg | ORAL_CAPSULE | Freq: Three times a day (TID) | ORAL | 0 refills | Status: DC

## 2023-08-31 MED ORDER — IPRATROPIUM-ALBUTEROL 0.5-2.5 (3) MG/3ML IN SOLN
3.0000 mL | Freq: Once | RESPIRATORY_TRACT | Status: AC
  Administered 2023-08-31: 3 mL via RESPIRATORY_TRACT

## 2023-08-31 MED ORDER — ATROVENT HFA 17 MCG/ACT IN AERS: 2.0000 | INHALATION_SPRAY | RESPIRATORY_TRACT | 12 refills | Status: AC | PRN

## 2023-08-31 NOTE — ED Triage Notes (Signed)
Reports last night coughed up blood tinged sputum. Recently dx with influenza. States still coughing and not feeling better. Cough is worse at night. States still having shortness of breath and using inhaler that was prescribed to him.

## 2023-08-31 NOTE — Telephone Encounter (Signed)
Copied from CRM [id number]. Topic: General - Other >> Aug 31, 2023  8:12 AM Halina Maidens L wrote: Reason for CRM: patient coughing up blood  Chief Complaint: flu Symptoms: productive cough/ mucous mixed with blood, chest/nasal congestion, stomach aches from coughing, fever, runny nose, stuffy nose, SOB at times Frequency: since 08/28/2023 Pertinent Negatives: Patient denies chest pain Disposition: [] ED /[x] Urgent Care (no appt availability in office) / [] Appointment(In office/virtual)/ []  [facility] [facility]/ [] Home Care/ [] Refused Recommended Disposition /[] [facility]/ []  Follow-up with PCP Additional Notes:  pt states he uses inhaler to help with SOB & wheezing but only lasts for about 1 hr and then it comes back.  Pt states he has coughing so much abd hurts.  made 9am appt for patient at [facility] [address] [place] [address] [phone]  Answer Assessment - Initial Assessment Questions 1. WORST SYMPTOM: "What is your worst symptom?" (e.g., cough, runny nose, muscle aches, headache, sore throat, fever)      Cough, stomach aches from coughing, fever, runny nose, stuffy nose, chest congestion, SOB 2. ONSET: "When did your flu symptoms start?"      08/28/23 3. COUGH: "How bad is the cough?"       Bad cough - productive mixed with blood 4. RESPIRATORY DISTRESS: "Describe your breathing."      SOB - using inhaler and helps with wheezing for about hour 5. FEVER: "Do you have a fever?" If Yes, ask: "What is your temperature, how was it measured, and when did it start?"     Warm to touch 6. EXPOSURE: "Were you exposed to someone with influenza?"       N/a  7. FLU VACCINE: "Did you get a flu shot this year?"     N/a 8. HIGH RISK DISEASE: "Do you have any chronic medical problems?" (e.g., heart or lung disease, asthma, weak immune system, or other HIGH RISK conditions)     N/a  9. PREGNANCY: "Is there any chance you are pregnant?"  "When was your last menstrual period?"     no 10. OTHER SYMPTOMS: "Do you have any other symptoms?"  (e.g., runny nose, muscle aches, headache, sore throat)       Cough, stomach aches from coughing, fever, runny nose, stuffy nose, chest congestion, SOB  Protocols used: Influenza (Flu) - [facility]

## 2023-08-31 NOTE — Discharge Instructions (Addendum)
You can take the Tessalon every 8 hours for cough suppression.  You can also use the Atrovent inhaler every 4-6 hours as needed for wheezing or shortness of breath.  Continue to take your Tamiflu and steroids.  Use your albuterol inhaler as needed.  If your symptoms persist over the next 3 to 5 days or if you develop any fever, worsening shortness of breath or wheezing please return to clinic for reassessment or seek immediate care at the nearest emergency department.

## 2023-08-31 NOTE — Telephone Encounter (Signed)
Reason for Disposition . [1] Fever > 101 F (38.3 C) AND [2] age > 60 years  Protocols used: Influenza (Flu) - [facility]

## 2023-08-31 NOTE — ED Provider Notes (Signed)
[facility]    CSN: [id number] Arrival date & time: 08/31/23  0845      History   Chief Complaint Chief Complaint  Patient presents with   Cough    Entered by patient    HPI Mark [name] is a 78 y.o. male.   Patient presents to clinic over concern of cough, wheezing and shortness of breath with coughing.  He has been using his albuterol inhaler which works for few hours, then his cough and shortness of breath return.  Last night he coughed up some sputum that was blood-tinged.  Reports his cough is worse at night.  Patient was recently seen at a local emergency department and diagnosed with influenza on 08/26/22, started on Tamiflu and prednisone burst.  At this visit his oxygenation saturation was 94%.  Patient brought with some medication in his pocket is unsure what age pillows and what they are for, continues to take memantine 11 AM.  Past medical history of obesity, acute renal failure, hypertension, heart failure, peripheral vascular disease, type 2 diabetes (A1C was 6.5 in July of 2024).   The history is provided by the patient and medical records.  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   ED (erectile dysfunction) 11/27/2019   Essential hypertension    Floaters 05/02/2022   HFrEF (heart failure with reduced ejection fraction) (HCC) 03/16/2022   History of BPH    Hyperlipidemia 07/20/2017   LBBB (left bundle branch block) 12/27/2021   Osteoarthritis of right knee 12/08/2020   Peripheral vascular disease (HCC)    Type 2 diabetes mellitus with  vascular disease [facility])     Patient Active Problem List   Diagnosis Date Noted   HFrEF (heart failure with reduced ejection fraction) (HCC) 03/16/2022   Cardiomyopathy (HCC) 03/16/2022   Cardiac murmur 03/02/2022   Arthritis 01/24/2022   Cervical spondylosis 01/11/2022   LBBB (left bundle branch block) 12/27/2021   Acute renal failure superimposed on stage 3a chronic kidney disease (HCC) 12/08/2020   Osteoarthritis of right knee 12/08/2020   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   ED (erectile dysfunction) 11/27/2019   Type 2 diabetes mellitus with vascular disease (HCC)    Essential hypertension 07/20/2017   Atherosclerotic heart disease of native coronary artery with angina pectoris (HCC) 07/20/2017   Peripheral vascular disease (HCC) 07/20/2017   History of BPH 07/20/2017   Hyperlipidemia 07/20/2017    Past Surgical History:  Procedure Laterality Date   HERNIA REPAIR     INGUINAL HERNIA REPAIR Bilateral    LEFT HEART CATH AND CORONARY ANGIOGRAPHY N/A 03/23/2022   Procedure: LEFT HEART CATH AND CORONARY ANGIOGRAPHY;  Surgeon: [place], Peter M, MD;  Location: [facility] INVASIVE CV LAB;  Service: Cardiovascular;  Laterality: N/A;       Home Medications    Prior to Admission medications   Medication Sig Start Date End Date Taking? Authorizing Provider  benzonatate (TESSALON) 200 MG capsule Take 1 capsule (200 mg total) by mouth every 8 (eight) hours. 08/31/23  Yes Rinaldo Ratel, [place] N, FNP  ipratropium (ATROVENT HFA) 17 MCG/ACT inhaler Inhale 2 puffs into the lungs every  4 (four) hours as needed for wheezing. 08/31/23  Yes Rinaldo Ratel, [place] N, FNP  albuterol (VENTOLIN HFA) 108 (90 Base) MCG/ACT inhaler Inhale 1-2 puffs into the lungs as needed for wheezing or shortness of breath. 06/23/19   [provider]  Alcohol Swabs (DROPSAFE ALCOHOL PREP) 70 % PADS 1 each by Does not apply route 2 (two) times daily. as directed 02/10/23   CoxFritzi Mandes, MD  aspirin 81 MG chewable tablet  Chew 81 mg by mouth daily.    [provider]  atorvastatin (LIPITOR) 20 MG tablet Take 1 tablet (20 mg total) by mouth daily. 06/05/23   CoxFritzi Mandes, MD  Baclofen 5 MG TABS Take 1 tablet (5 mg total) by mouth 2 (two) times daily as needed. 08/25/23   Rising, Lurena Joiner, PA-C  Blood Glucose Calibration (TRUE METRIX LEVEL 1) Low SOLN 1 each by In Vitro route every 30 (thirty) days. 11/07/21   Abigail Miyamoto, MD  Blood Glucose Monitoring Suppl (TRUE METRIX AIR GLUCOSE METER) w/Device KIT 1 each by Does not apply route in the morning and at bedtime. 11/03/21   Abigail Miyamoto, MD  carvedilol (COREG) 6.25 MG tablet TAKE 1 TABLET EVERY DAY 08/22/23   Cox, Fritzi Mandes, MD  empagliflozin (JARDIANCE) 10 MG TABS tablet Take 1 tablet (10 mg total) by mouth daily before breakfast. 03/09/23   Cox, Fritzi Mandes, MD  furosemide (LASIX) 20 MG tablet TAKE 1 TABLET EVERY OTHER DAY 10/12/22   Cox, Fritzi Mandes, MD  glucose blood (TRUE METRIX BLOOD GLUCOSE TEST) test strip TEST BLOOD SUGAR IN THE MORNING AND AT BEDTIME AS INSTRUCTED 02/07/23   CoxFritzi Mandes, MD  Magnesium 500 MG TABS Take 500 mg by mouth in the morning.    [provider]  metFORMIN (GLUCOPHAGE) 500 MG tablet TAKE 1/2 TABLET TWICE DAILY Patient not taking: Reported on 08/25/2023 08/22/23   Blane Ohara, MD  nitroGLYCERIN (NITROSTAT) 0.4 MG SL tablet DISSOLVE ONE TABLET UNDER TONGUE AS NEEDED FOR CHEST PAIN UP TO 3 TABLETS 05/02/22   Abigail Miyamoto, MD  oseltamivir (TAMIFLU) 75 MG capsule Take 75 mg by mouth 2 (two) times daily. 08/27/23 09/01/23  [provider]  predniSONE (DELTASONE) 20 MG tablet Take 40 mg by mouth daily with breakfast. 08/27/23 08/31/23  [provider]  spironolactone (ALDACTONE) 25 MG tablet TAKE 1/2 TABLET EVERY DAY 08/22/23   Cox, Kirsten, MD  TRUEplus Lancets 30G MISC TEST BLOOD SUGAR IN THE MORNING AND AT BEDTIME. 02/07/23   Cox, Fritzi Mandes, MD    Family History Family History  Problem Relation Age of  Onset   Cancer Mother    Hypertension Neg Hx    Heart disease Neg Hx    Diabetes Neg Hx     Social History Social History   Tobacco Use   Smoking status: Never   Smokeless tobacco: Never  Vaping Use   Vaping status: Never Used  Substance Use Topics   Alcohol use: No   Drug use: No     Allergies   Penicillins   Review of Systems Review of Systems  Per HPI  Physical Exam Triage Vital Signs ED Triage Vitals  Encounter Vitals Group     BP 08/31/23 0854 118/76     Systolic BP Percentile --      Diastolic BP Percentile --      Pulse Rate 08/31/23 0854 60     Resp 08/31/23 0854 20     Temp 08/31/23 0854 97.8 F (36.6 C)  Temp Source 08/31/23 0854 Oral     SpO2 08/31/23 0854 93 %     Weight --      Height --      Head Circumference --      Peak Flow --      Pain Score 08/31/23 0856 7     Pain Loc --      Pain Education --      Exclude from Growth Chart --    No data found.  Updated Vital Signs BP 118/76 (BP Location: Right Arm)   Pulse (!) 58   Temp 97.8 F (36.6 C) (Oral)   Resp 20   SpO2 98%   Visual Acuity Right Eye Distance:   Left Eye Distance:   Bilateral Distance:    Right Eye Near:   Left Eye Near:    Bilateral Near:     Physical Exam Vitals and nursing note reviewed.  Constitutional:      Appearance: Normal appearance.  HENT:     Head: Normocephalic and atraumatic.     Right Ear: External ear normal.     Left Ear: External ear normal.     Nose: Nose normal.     Mouth/Throat:     Mouth: Mucous membranes are moist.  Eyes:     Conjunctiva/sclera: Conjunctivae normal.  Cardiovascular:     Rate and Rhythm: Normal rate and regular rhythm.     Heart sounds: Normal heart sounds. No murmur heard. Pulmonary:     Effort: Pulmonary effort is normal.     Breath sounds: Wheezing present.  Musculoskeletal:        General: Normal range of motion.  Skin:    General: Skin is warm.  Neurological:     General: No focal deficit present.      Mental Status: He is alert and oriented to [place], place, and time.  Psychiatric:        Mood and Affect: Mood normal.      UC Treatments / Results  Labs (all labs ordered are listed, but only abnormal results are displayed) Labs Reviewed - No data to display  EKG   Radiology No results found.  Procedures Procedures (including critical care time)  Medications Ordered in UC Medications  ipratropium-albuterol (DUONEB) 0.5-2.5 (3) MG/3ML nebulizer solution 3 mL (3 mLs Nebulization Given 08/31/23 0914)  dexamethasone (DECADRON) injection 10 mg (10 mg Intramuscular Given 08/31/23 0938)    Initial Impression / Assessment and Plan / UC Course  I have reviewed the triage vital signs and the nursing notes.  Pertinent labs & imaging results that were available during my care of the patient were reviewed by me and considered in my medical decision making (see chart for details).  Vitals and triage patient is hemodynamically stable.  Oxygenation 97% on recheck prior to regular rate and rhythm, lungs expiratory wheezing.  Appears to be on his last day of Tamiflu and prednisone.  DuoNeb given in clinic for wheezing.  Improvement in wheezing and cough after DuoNeb.  Will send in Atrovent inhaler and Tessalon.  Oxygenation continues to be stable on room air.  Suspect influenza continues due to age and chronic medical conditions, encouraged to finish Tamiflu and oral steroid.  Will give IM steroid here in clinic, one-time dose.  Strict emergency return precautions given if symptoms persist, we can discuss antibiotics at that time, do not feel as if they are indicated currently due to recent diagnosis of viral illness.    Final Clinical Impressions(s) /  UC Diagnoses   Final diagnoses:  Influenza A  Acute cough     Discharge Instructions      You can take the Tessalon every 8 hours for cough suppression.  You can also use the Atrovent inhaler every 4-6 hours as needed for wheezing or  shortness of breath.  Continue to take your Tamiflu and steroids.  Use your albuterol inhaler as needed.  If your symptoms persist over the next 3 to 5 days or if you develop any fever, worsening shortness of breath or wheezing please return to clinic for reassessment or seek immediate care at the nearest emergency department.      ED Prescriptions     Medication Sig Dispense Auth. Provider   benzonatate (TESSALON) 200 MG capsule Take 1 capsule (200 mg total) by mouth every 8 (eight) hours. 30 capsule Rinaldo Ratel, [place] N, FNP   ipratropium (ATROVENT HFA) 17 MCG/ACT inhaler Inhale 2 puffs into the lungs every 4 (four) hours as needed for wheezing. 1 each Dessire Grimes, [place] N, FNP      PDMP not reviewed this encounter.   Rinaldo Ratel [place] N, [place] 08/31/23 [phone]

## 2023-09-02 ENCOUNTER — Emergency Department (HOSPITAL_BASED_OUTPATIENT_CLINIC_OR_DEPARTMENT_OTHER): Payer: Medicare HMO

## 2023-09-02 ENCOUNTER — Emergency Department (HOSPITAL_BASED_OUTPATIENT_CLINIC_OR_DEPARTMENT_OTHER)
Admission: EM | Admit: 2023-09-02 | Discharge: 2023-09-02 | Disposition: A | Discharge status: Home / Self Care | Payer: Medicare HMO | Attending: Emergency Medicine | Admitting: Emergency Medicine

## 2023-09-02 ENCOUNTER — Other Ambulatory Visit: Payer: Self-pay

## 2023-09-02 ENCOUNTER — Encounter (HOSPITAL_BASED_OUTPATIENT_CLINIC_OR_DEPARTMENT_OTHER): Payer: Self-pay | Admitting: Emergency Medicine

## 2023-09-02 DIAGNOSIS — R0602 Shortness of breath: Secondary | ICD-10-CM | POA: Insufficient documentation

## 2023-09-02 DIAGNOSIS — R42 Dizziness and giddiness: Secondary | ICD-10-CM | POA: Insufficient documentation

## 2023-09-02 DIAGNOSIS — J111 Influenza due to unidentified influenza virus with other respiratory manifestations: Secondary | ICD-10-CM | POA: Insufficient documentation

## 2023-09-02 DIAGNOSIS — R052 Subacute cough: Secondary | ICD-10-CM | POA: Insufficient documentation

## 2023-09-02 DIAGNOSIS — Z7982 Long term (current) use of aspirin: Secondary | ICD-10-CM | POA: Insufficient documentation

## 2023-09-02 DIAGNOSIS — R059 Cough, unspecified: Secondary | ICD-10-CM | POA: Diagnosis not present

## 2023-09-02 DIAGNOSIS — J101 Influenza due to other identified influenza virus with other respiratory manifestations: Secondary | ICD-10-CM | POA: Diagnosis not present

## 2023-09-02 LAB — CBC WITH DIFFERENTIAL/PLATELET
Abs Immature Granulocytes: 0.07 10*3/uL (ref 0.00–0.07)
Basophils Absolute: 0 10*3/uL (ref 0.0–0.1)
Basophils Relative: 0 %
Eosinophils Absolute: 0.1 10*3/uL (ref 0.0–0.5)
Eosinophils Relative: 1 %
HCT: 45.2 % (ref 39.0–52.0)
Hemoglobin: 14.8 g/dL (ref 13.0–17.0)
Immature Granulocytes: 1 %
Lymphocytes Relative: 44 %
Lymphs Abs: 3.3 10*3/uL (ref 0.7–4.0)
MCH: 31 pg (ref 26.0–34.0)
MCHC: 32.7 g/dL (ref 30.0–36.0)
MCV: 94.8 fL (ref 80.0–100.0)
Monocytes Absolute: 0.9 10*3/uL (ref 0.1–1.0)
Monocytes Relative: 12 %
Neutro Abs: 3.1 10*3/uL (ref 1.7–7.7)
Neutrophils Relative %: 42 %
Platelets: 314 10*3/uL (ref 150–400)
RBC: 4.77 MIL/uL (ref 4.22–5.81)
RDW: 13.9 % (ref 11.5–15.5)
WBC: 7.4 10*3/uL (ref 4.0–10.5)
nRBC: 0 % (ref 0.0–0.2)

## 2023-09-02 LAB — BASIC METABOLIC PANEL
Anion gap: 7 (ref 5–15)
BUN: 31 mg/dL — ABNORMAL HIGH (ref 8–23)
CO2: 29 mmol/L (ref 22–32)
Calcium: 9.7 mg/dL (ref 8.9–10.3)
Chloride: 107 mmol/L (ref 98–111)
Creatinine, Ser: 1.36 mg/dL — ABNORMAL HIGH (ref 0.61–1.24)
GFR, Estimated: 54 mL/min — ABNORMAL LOW (ref >60)
Glucose, Bld: 107 mg/dL — ABNORMAL HIGH (ref 70–99)
Potassium: 3.9 mmol/L (ref 3.5–5.1)
Sodium: 143 mmol/L (ref 135–145)

## 2023-09-02 MED ORDER — ALBUTEROL SULFATE (2.5 MG/3ML) 0.083% IN NEBU
5.0000 mg | INHALATION_SOLUTION | Freq: Once | RESPIRATORY_TRACT | Status: AC
  Administered 2023-09-02: 5 mg via RESPIRATORY_TRACT

## 2023-09-02 MED ORDER — ALBUTEROL SULFATE (2.5 MG/3ML) 0.083% IN NEBU
5.0000 mg | INHALATION_SOLUTION | Freq: Once | RESPIRATORY_TRACT | Status: AC
  Administered 2023-09-02: 5 mg via RESPIRATORY_TRACT
  Filled 2023-09-02: qty 6

## 2023-09-02 MED ORDER — SODIUM CHLORIDE 0.9 % IV BOLUS
500.0000 mL | Freq: Once | INTRAVENOUS | Status: AC
  Administered 2023-09-02: 500 mL via INTRAVENOUS

## 2023-09-02 MED ORDER — ALBUTEROL SULFATE HFA 108 (90 BASE) MCG/ACT IN AERS
2.0000 | INHALATION_SPRAY | RESPIRATORY_TRACT | Status: DC | PRN
  Administered 2023-09-02: 2 via RESPIRATORY_TRACT
  Filled 2023-09-02: qty 6.7

## 2023-09-02 MED ORDER — SODIUM CHLORIDE 0.9 % IV BOLUS: 1000.0000 mL | Freq: Once | INTRAVENOUS | Status: DC

## 2023-09-02 MED ORDER — ALBUTEROL SULFATE (2.5 MG/3ML) 0.083% IN NEBU
INHALATION_SOLUTION | RESPIRATORY_TRACT | Status: AC
  Filled 2023-09-02: qty 6

## 2023-09-02 NOTE — ED Provider Notes (Addendum)
[place] EMERGENCY DEPARTMENT AT [facility] Provider Note   CSN: [id number] Arrival date & time: 09/02/23  1334     History  No chief complaint on file.   Mark [name] is a 78 y.o. male.  78 year old male here today with cough, feeling lightheaded, feeling short of breath.  Patient was diagnosed with influenza on the 13th.  He denies fever or chills, but states he has been feeling increasingly short of breath and coughing.  He says that when he stands up he begins to feel lightheaded.        Home Medications Prior to Admission medications   Medication Sig Start Date End Date Taking? Authorizing Provider  albuterol (VENTOLIN HFA) 108 (90 Base) MCG/ACT inhaler Inhale 1-2 puffs into the lungs as needed for wheezing or shortness of breath. 06/23/19   [provider]  Alcohol Swabs (DROPSAFE ALCOHOL PREP) 70 % PADS 1 each by Does not apply route 2 (two) times daily. as directed 02/10/23   CoxFritzi Mandes, MD  aspirin 81 MG chewable tablet Chew 81 mg by mouth daily.    [provider]  atorvastatin (LIPITOR) 20 MG tablet Take 1 tablet (20 mg total) by mouth daily. 06/05/23   CoxFritzi Mandes, MD  Baclofen 5 MG TABS Take 1 tablet (5 mg total) by mouth 2 (two) times daily as needed. 08/25/23   Rising, Lurena Joiner, PA-C  benzonatate (TESSALON) 200 MG capsule Take 1 capsule (200 mg total) by mouth every 8 (eight) hours. 08/31/23   Garrison, [place] N, FNP  Blood Glucose Calibration (TRUE METRIX LEVEL 1) Low SOLN 1 each by In Vitro route every 30 (thirty) days. 11/07/21   Abigail Miyamoto, MD  Blood Glucose Monitoring Suppl (TRUE METRIX AIR GLUCOSE METER) w/Device KIT 1 each by Does not apply route in the morning and at bedtime. 11/03/21   Abigail Miyamoto, MD  carvedilol (COREG) 6.25 MG tablet TAKE 1 TABLET EVERY DAY 08/22/23   Cox, Fritzi Mandes, MD  empagliflozin (JARDIANCE) 10 MG TABS tablet Take 1 tablet (10 mg total) by mouth daily before breakfast. 03/09/23   Cox,  Fritzi Mandes, MD  furosemide (LASIX) 20 MG tablet TAKE 1 TABLET EVERY OTHER DAY 10/12/22   Cox, Fritzi Mandes, MD  glucose blood (TRUE METRIX BLOOD GLUCOSE TEST) test strip TEST BLOOD SUGAR IN THE MORNING AND AT BEDTIME AS INSTRUCTED 02/07/23   Blane Ohara, MD  ipratropium (ATROVENT HFA) 17 MCG/ACT inhaler Inhale 2 puffs into the lungs every 4 (four) hours as needed for wheezing. 08/31/23   Garrison, [place] N, FNP  Magnesium 500 MG TABS Take 500 mg by mouth in the morning.    [provider]  metFORMIN (GLUCOPHAGE) 500 MG tablet TAKE 1/2 TABLET TWICE DAILY Patient not taking: Reported on 08/25/2023 08/22/23   Blane Ohara, MD  nitroGLYCERIN (NITROSTAT) 0.4 MG SL tablet DISSOLVE ONE TABLET UNDER TONGUE AS NEEDED FOR CHEST PAIN UP TO 3 TABLETS 05/02/22   Abigail Miyamoto, MD  spironolactone (ALDACTONE) 25 MG tablet TAKE 1/2 TABLET EVERY DAY 08/22/23   Cox, Fritzi Mandes, MD  TRUEplus Lancets 30G MISC TEST BLOOD SUGAR IN THE MORNING AND AT BEDTIME. 02/07/23   CoxFritzi Mandes, MD      Allergies    Penicillins    Review of Systems   Review of Systems  Physical Exam Updated Vital Signs BP 122/75 (BP Location: Right Arm)   Pulse 70   Temp 98 F (36.7 C)   Resp 20   SpO2 100%  Physical Exam  Vitals reviewed.  Constitutional:      Appearance: Normal appearance. He is not toxic-appearing.  HENT:     Head: Normocephalic and atraumatic.     Mouth/Throat:     Mouth: Mucous membranes are dry.  Eyes:     Pupils: Pupils are equal, round, and reactive to light.  Neck:     Vascular: No carotid bruit.  Cardiovascular:     Rate and Rhythm: Normal rate.  Pulmonary:     Effort: Pulmonary effort is normal.     Comments: Rhonchorous breath sounds in bilateral bases.  No wheezing. Neurological:     General: No focal deficit present.     Mental Status: He is alert.     Cranial Nerves: No cranial nerve deficit.     Motor: No weakness.     Coordination: Coordination normal.     Gait: Gait normal.     ED  Results / Procedures / Treatments   Labs (all labs ordered are listed, but only abnormal results are displayed) Labs Reviewed  CBC WITH DIFFERENTIAL/PLATELET  BASIC METABOLIC PANEL    EKG EKG Interpretation Date/Time:  Sunday September 02 2023 14:17:20 EST Ventricular Rate:  66 PR Interval:  183 QRS Duration:  174 QT Interval:  480 QTC Calculation: 503 R Axis:   72  Text Interpretation: Sinus rhythm Left bundle branch block Confirmed by Anders Simmonds [phone]) on 09/02/2023 2:24:40 PM  Radiology DG Chest Port 1 View Result Date: 09/02/2023 CLINICAL DATA:  Cough.  Short of breath. EXAM: PORTABLE CHEST 1 VIEW COMPARISON:  08/25/2023. FINDINGS: Cardiac silhouette is normal in size. No mediastinal or hilar masses. Clear lungs.  No pleural effusion or pneumothorax. Skeletal structures are grossly intact IMPRESSION: No active disease. Electronically Signed   By: Amie Portland M.D.   On: 09/02/2023 14:43    Procedures Procedures    Medications Ordered in ED Medications  albuterol (VENTOLIN HFA) 108 (90 Base) MCG/ACT inhaler 2 puff (2 puffs Inhalation Given 09/02/23 1434)  sodium chloride 0.9 % bolus 500 mL (has no administration in time range)  albuterol (PROVENTIL) (2.5 MG/3ML) 0.083% nebulizer solution 5 mg (5 mg Nebulization Given 09/02/23 1401)  albuterol (PROVENTIL) (2.5 MG/3ML) 0.083% nebulizer solution 5 mg (5 mg Nebulization Given 09/02/23 1434)    ED Course/ Medical Decision Making/ A&P Clinical Course as of 09/02/23 1518  Sun Sep 02, 2023  1511 Assumed care from Dr Andria Meuse. 78 yo M previously healthy coming in with URI symptoms (recent flu diagnosis) had some rhonchi on exam today. Getting fluids. CXR without PNA. No hx of smoking.  [RP]    Clinical Course User Index [RP] Rondel Baton, MD                                 Medical Decision Making 78 year old male who is here today for cough, weakness, lightheadedness in the setting of influenza diagnosis 1 week ago.   Differential diagnoses include dehydration, pneumonia, less likely pulmonary edema, less likely CHF exacerbation, less likely myocarditis, less likely ACS, less likely posterior CVA.  Plan-patient has rhonchorous breath sounds.  He has no history of pulmonary disease, no smoking history.  Respiratory therapy had ordered some albuterol for the patient with his cough, and seem to help out with cough that he had been experiencing.  Patient did take a steroid burst early on in his course, unfortunately this has been shown to increase possibility of pneumonia.  Will check a chest x-ray on the patient.  Basic blood work ordered although I expect the patient to have a leukocytosis.  Patient with history of reduced EF, does take small amount of Lasix.  No lower extremity edema, no orthopnea.  Does not appear to be fluid overloaded.  If chest x-ray showed pulmonary edema, would consider adding an BNP, however in the setting of recent influenza believe that this is less likely.  Patient signed out to Dr. Jarold Motto pending BMP, fluid bolus.  Amount and/or Complexity of Data Reviewed Labs: ordered. Radiology: ordered.  Risk Prescription drug management.           Final Clinical Impression(s) / ED Diagnoses Final diagnoses:  Subacute cough    Rx / DC Orders ED Discharge Orders     None         Arletha Pili, DO 09/02/23 1516    Anders Simmonds T, DO 09/02/23 1518

## 2023-09-02 NOTE — Assessment & Plan Note (Signed)
Education given.  Complete influenza and prednisone. Continue push fluids and rest.

## 2023-09-02 NOTE — ED Provider Notes (Signed)
  Physical Exam  BP 122/75 (BP Location: Right Arm)   Pulse 70   Temp 98 F (36.7 C)   Resp 20   SpO2 100%   Physical Exam  Procedures  Procedures  ED Course / MDM   Clinical Course as of 09/03/23 2337  Wynelle Link Sep 02, 2023  1511 Assumed care from Dr Andria Meuse. 78 yo M previously healthy coming in with URI symptoms (recent flu diagnosis) had some rhonchi on exam today. Getting fluids. CXR without PNA. No hx of smoking.  [RP]  1526 Creatinine(!): 1.36 Baseline 1.2 [RP]    Clinical Course User Index [RP] Rondel Baton, MD   Medical Decision Making Amount and/or Complexity of Data Reviewed Labs: ordered. Decision-making details documented in ED Course. Radiology: ordered.  Risk Prescription drug management.   Labs have returned without acute abnormality.  Patient still satting well on room air.  Does have some rhonchi.  Chest x-ray without pneumonia.  Was given nebulizers and albuterol to take at home.  Will him follow-up with his primary doctor in several days for his influenza.   Rondel Baton, MD 09/03/23 [phone]

## 2023-09-02 NOTE — ED Triage Notes (Signed)
Pt states he is still dizzy, and short of breath since dx with flu.

## 2023-09-02 NOTE — Discharge Instructions (Signed)
You were seen for your upper respiratory tract infection in the emergency department.   At home, please use Tylenol for any muscle aches or fevers.  Please use over-the-counter cough medication or tea with honey for your cough.  Follow-up with your primary doctor in 2-3 days regarding your visit.  This may be over the phone if you are feeling better or in [place] if you are feeling worse.  Return immediately to the emergency department if you experience any of the following: Difficulty breathing, or any other concerning symptoms.    Thank you for visiting our Emergency Department. It was a pleasure taking care of you today.

## 2023-09-03 ENCOUNTER — Telehealth: Payer: Self-pay

## 2023-09-03 NOTE — Transitions of Care (Post Inpatient/ED Visit) (Signed)
   09/03/2023  Name: Mark [name] MRN: [id number] DOB: [birth year]  Today's TOC FU Call Status: Today's TOC FU Call Status:: Unsuccessful Call (1st Attempt) Unsuccessful Call (1st Attempt) Date: 09/03/23  Attempted to reach the patient regarding the most recent Inpatient/ED visit.  Patient needs appointment with Dr Faylene Kurtz if symptoms continue.  Follow Up Plan: Additional outreach attempts will be made to reach the patient to complete the Transitions of Care (Post Inpatient/ED visit) call.   Signature Creola Corn, LPN  78/29/56 21:30 AM

## 2023-09-04 NOTE — Transitions of Care (Post Inpatient/ED Visit) (Signed)
   09/04/2023  Name: Mark [name] Tierce MRN: [id number] DOB: 1946/02/08  Today's TOC FU Call Status: Today's TOC FU Call Status:: Unsuccessful Call (2nd Attempt) Unsuccessful Call (1st Attempt) Date: 09/03/23 Unsuccessful Call (2nd Attempt) Date: 09/04/23  Attempted to reach the patient regarding the most recent Inpatient/ED visit.  Follow Up Plan: Additional outreach attempts will be made to reach the patient to complete the Transitions of Care (Post Inpatient/ED visit) call.   Signature Creola Corn, LPN  78/29/56 2:13 PM

## 2023-09-09 DIAGNOSIS — J101 Influenza due to other identified influenza virus with other respiratory manifestations: Secondary | ICD-10-CM | POA: Diagnosis not present

## 2023-09-13 ENCOUNTER — Ambulatory Visit: Payer: Medicare HMO

## 2023-09-13 VITALS — BP 102/64 | HR 70 | Temp 98.4°F | Ht 67.0 in | Wt 201.0 lb

## 2023-09-13 DIAGNOSIS — R053 Chronic cough: Secondary | ICD-10-CM | POA: Diagnosis not present

## 2023-09-13 DIAGNOSIS — I5020 Unspecified systolic (congestive) heart failure: Secondary | ICD-10-CM

## 2023-09-13 DIAGNOSIS — E119 Type 2 diabetes mellitus without complications: Secondary | ICD-10-CM | POA: Insufficient documentation

## 2023-09-13 DIAGNOSIS — Z6831 Body mass index (BMI) 31.0-31.9, adult: Secondary | ICD-10-CM | POA: Diagnosis not present

## 2023-09-13 DIAGNOSIS — Z152 Genetic susceptibility to obesity: Secondary | ICD-10-CM

## 2023-09-13 DIAGNOSIS — E1159 Type 2 diabetes mellitus with other circulatory complications: Secondary | ICD-10-CM | POA: Diagnosis not present

## 2023-09-13 DIAGNOSIS — E8882 Obesity due to disruption of MC4R pathway: Secondary | ICD-10-CM | POA: Diagnosis not present

## 2023-09-13 DIAGNOSIS — E118 Type 2 diabetes mellitus with unspecified complications: Secondary | ICD-10-CM | POA: Insufficient documentation

## 2023-09-13 DIAGNOSIS — I10 Essential (primary) hypertension: Secondary | ICD-10-CM

## 2023-09-13 DIAGNOSIS — E66811 Obesity, class 1: Secondary | ICD-10-CM | POA: Insufficient documentation

## 2023-09-13 DIAGNOSIS — E782 Mixed hyperlipidemia: Secondary | ICD-10-CM

## 2023-09-13 MED ORDER — FLUTICASONE PROPIONATE 50 MCG/ACT NA SUSP: 2.0000 | Freq: Every day | NASAL | 6 refills | Status: AC

## 2023-09-13 NOTE — Assessment & Plan Note (Signed)
Persistent cough since January 8th, initially treated with Tamiflu for flu diagnosed on January 13th. Symptoms include body aches, fever, and cough. No pneumonia on chest x-ray as of 08/22/23. Cough has improved slightly but persists, worse at night. Mild shortness of breath noted. Physical exam reveals no pneumonia or fluid in lungs, and minimal nasal drainage. Post-viral cough can take weeks to resolve. Nasal spray can help dry up drainage and reduce cough. If symptoms persist, a chest x-ray will be considered to rule out other causes. - Prescribe nasal spray FLONASE for postnasal drip - Continue Mucinex over the counter - If cough persists for a couple of weeks, order repeat chest x-ray

## 2023-09-13 NOTE — Assessment & Plan Note (Signed)
Recommend to continue healthy, low calorie and low sodium diet in view of his class 1 obesity    General Health Maintenance Did not receive a flu shot this year, which may have contributed to contracting the flu. Discussed the importance of flu vaccination and will consider it at a later date. - Consider flu vaccination at a later date, as recently had the flu  Follow-up - Advise on taking breaks during long drives to prevent blood clots - Recommend healthy food choices during travel - Follow up in three months.

## 2023-09-13 NOTE — Progress Notes (Signed)
Subjective:  Patient ID: Mark [name], male    DOB: 1946-05-26  Age: 78 y.o. MRN: [id number]  Chief Complaint  Patient presents with   Hyperlipidemia   Hypertension   Diabetes   Patient is here today for a chronic follow up. He states that he had the flu two weeks ago and cannot get rid of the cough.  Hyperlipidemia Pertinent negatives include no chest pain, myalgias or shortness of breath.  Hypertension Pertinent negatives include no chest pain, headaches or shortness of breath.  Diabetes Pertinent negatives for hypoglycemia include no dizziness, headaches or nervousness/anxiousness. Pertinent negatives for diabetes include no chest pain and no fatigue.     The patient, with heart failure and diabetes, presents with a persistent cough.  He has been experiencing a persistent cough since January 8th, following a positive flu test on January 13th. Initially, he sought care at urgent care on January 11th, where a chest x-ray showed no pneumonia. Despite completing a course of Tamiflu by January 18th, the cough persists, particularly worsening at night. He also experiences mild shortness of breath. Nasal drainage began about a week ago, and he confirms frequent urination. No ear or abdominal pain.  He has a history of heart failure, with a heart ultrasound in December 2023 showing an ejection fraction of 25-30%. He reports occasional leg swelling and maintains that his shortness of breath is stable. He uses an inhaler approximately once every two weeks.  He has diabetes, with his last blood work in July showing a three-month average blood sugar level of 6.5. He has been trying to manage his diet and exercise but has reduced physical activity due to his recent illness. He used to walk five miles daily but now manages this twice a week. He occasionally checks his blood pressure and weight at home and has noticed a slight increase in weight.  He lives alone and has been driving a truck for  forty years, which affects his sleep, as he typically gets four to five hours of sleep per night. He did not receive a flu shot this year, which he attributes to contracting the flu.     09/13/2023    7:45 AM 03/09/2023    8:01 AM 10/12/2022    8:43 AM 09/05/2022    8:22 AM 12/27/2021    7:48 AM  Depression screen PHQ 2/9  Decreased Interest 0 2 0 0 0  Down, Depressed, Hopeless 0 0 0 0 0  PHQ - 2 Score 0 2 0 0 0  Altered sleeping  3     Tired, decreased energy  0     Change in appetite  0     Feeling bad or failure about yourself   0     Trouble concentrating  0     Moving slowly or fidgety/restless  0     Suicidal thoughts  0     PHQ-9 Score  5     Difficult doing work/chores  Not difficult at all           03/09/2023    8:01 AM  Fall Risk   Falls in the past year? 0  Number falls in past yr: 0  Injury with Fall? 0  Risk for fall due to : No Fall Risks  Follow up Falls evaluation completed;Falls prevention discussed    Patient Care Team: Windell Moment, MD as PCP - General (Family Medicine)   Review of Systems  Constitutional:  Negative for chills, fatigue and  fever.  HENT:  Positive for congestion. Negative for ear pain, sinus pressure, sinus pain and sore throat.   Respiratory:  Positive for cough. Negative for shortness of breath.   Cardiovascular:  Negative for chest pain.  Gastrointestinal:  Negative for abdominal pain, constipation, diarrhea, nausea and vomiting.  Genitourinary:  Negative for dysuria and frequency.  Musculoskeletal:  Negative for arthralgias, back pain and myalgias.  Neurological:  Negative for dizziness and headaches.  Psychiatric/Behavioral:  Positive for sleep disturbance. Negative for dysphoric mood. The patient is not nervous/anxious.     Current Outpatient Medications on File Prior to Visit  Medication Sig Dispense Refill   albuterol (VENTOLIN HFA) 108 (90 Base) MCG/ACT inhaler Inhale 1-2 puffs into the lungs as needed for wheezing or  shortness of breath.     Alcohol Swabs (DROPSAFE ALCOHOL PREP) 70 % PADS 1 each by Does not apply route 2 (two) times daily. as directed 200 each 3   aspirin 81 MG chewable tablet Chew 81 mg by mouth daily.     atorvastatin (LIPITOR) 20 MG tablet Take 1 tablet (20 mg total) by mouth daily. 90 tablet 2   Baclofen 5 MG TABS Take 1 tablet (5 mg total) by mouth 2 (two) times daily as needed. 20 tablet 0   benzonatate (TESSALON) 200 MG capsule Take 1 capsule (200 mg total) by mouth every 8 (eight) hours. 30 capsule 0   Blood Glucose Calibration (TRUE METRIX LEVEL 1) Low SOLN 1 each by In Vitro route every 30 (thirty) days. 1 each 2   Blood Glucose Monitoring Suppl (TRUE METRIX AIR GLUCOSE METER) w/Device KIT 1 each by Does not apply route in the morning and at bedtime. 1 kit 0   carvedilol (COREG) 6.25 MG tablet TAKE 1 TABLET EVERY DAY 90 tablet 0   empagliflozin (JARDIANCE) 10 MG TABS tablet Take 1 tablet (10 mg total) by mouth daily before breakfast.     furosemide (LASIX) 20 MG tablet TAKE 1 TABLET EVERY OTHER DAY 45 tablet 3   glucose blood (TRUE METRIX BLOOD GLUCOSE TEST) test strip TEST BLOOD SUGAR IN THE MORNING AND AT BEDTIME AS INSTRUCTED 200 strip 3   ipratropium (ATROVENT HFA) 17 MCG/ACT inhaler Inhale 2 puffs into the lungs every 4 (four) hours as needed for wheezing. 1 each 12   Magnesium 500 MG TABS Take 500 mg by mouth in the morning.     metFORMIN (GLUCOPHAGE) 500 MG tablet TAKE 1/2 TABLET TWICE DAILY 90 tablet 0   nitroGLYCERIN (NITROSTAT) 0.4 MG SL tablet DISSOLVE ONE TABLET UNDER TONGUE AS NEEDED FOR CHEST PAIN UP TO 3 TABLETS 25 tablet 11   spironolactone (ALDACTONE) 25 MG tablet TAKE 1/2 TABLET EVERY DAY 45 tablet 0   TRUEplus Lancets 30G MISC TEST BLOOD SUGAR IN THE MORNING AND AT BEDTIME. 200 each 3   No current facility-administered medications on file prior to visit.   Past Medical History:  Diagnosis Date   Acute renal failure superimposed on stage 3a chronic kidney  disease (HCC) 12/08/2020   Arthritis    Atherosclerotic heart disease of native coronary artery with angina pectoris (HCC) 07/20/2017   BMI 31.0-31.9,adult 03/29/2020   BMI 32.0-32.9,adult 03/29/2020   Cardiac murmur 03/02/2022   Cardiomyopathy (HCC) 03/16/2022   Cervical spondylosis 01/11/2022   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   ED (erectile dysfunction) 11/27/2019   Essential hypertension    Floaters 05/02/2022   HFrEF (heart failure with reduced ejection fraction) (HCC) 03/16/2022   History of BPH  Hyperlipidemia 07/20/2017   LBBB (left bundle branch block) 12/27/2021   Osteoarthritis of right knee 12/08/2020   Peripheral vascular disease (HCC)    Type 2 diabetes mellitus with vascular disease (HCC)    Past Surgical History:  Procedure Laterality Date   HERNIA REPAIR     INGUINAL HERNIA REPAIR Bilateral    LEFT HEART CATH AND CORONARY ANGIOGRAPHY N/A 03/23/2022   Procedure: LEFT HEART CATH AND CORONARY ANGIOGRAPHY;  Surgeon: [place], Peter M, MD;  Location: [facility] INVASIVE CV LAB;  Service: Cardiovascular;  Laterality: N/A;    Family History  Problem Relation Age of Onset   Cancer Mother    Hypertension Neg Hx    Heart disease Neg Hx    Diabetes Neg Hx    Social History   Socioeconomic History   Marital status: Single    Spouse name: Not on file   Number of children: 4   Years of education: 12   Highest education level: 12th grade  Occupational History   Occupation: retired   Tobacco Use   Smoking status: Never   Smokeless tobacco: Never  Vaping Use   Vaping status: Never Used  Substance and Sexual Activity   Alcohol use: No   Drug use: No   Sexual activity: Not Currently  Other Topics Concern   Not on file  Social History Narrative   Lives in a hotel and feels safe with his living arrangements.    Social Drivers of Corporate investment banker Strain: Low Risk  (09/13/2023)   Overall Financial Resource Strain (CARDIA)    Difficulty of Paying Living  Expenses: Not very hard  Food Insecurity: No Food Insecurity (09/13/2023)   Hunger Vital Sign    Worried About Running Out of Food in the Last Year: Never true    Ran Out of Food in the Last Year: Never true  Transportation Needs: No Transportation Needs (09/13/2023)   PRAPARE - Administrator, Civil Service (Medical): No    Lack of Transportation (Non-Medical): No  Physical Activity: Sufficiently Active (09/13/2023)   Exercise Vital Sign    Days of Exercise per Week: 4 days    Minutes of Exercise per Session: 60 min  Stress: No Stress Concern Present (10/12/2022)   [employer] of Occupational Health - Occupational Stress Questionnaire    Feeling of Stress : Not at all  Social Connections: Moderately Isolated (09/13/2023)   Social Connection and Isolation Panel [NHANES]    Frequency of Communication with Friends and Family: More than three times a week    Frequency of Social Gatherings with Friends and Family: Twice a week    Attends Religious Services: 1 to 4 times per year    Active Member of [employer] or Organizations: No    Attends Engineer, structural: Never    Marital Status: Never married    Objective:  BP 102/64   Pulse 70   Temp 98.4 F (36.9 C)   Ht 5\' 7"  (1.702 m)   Wt 201 lb (91.2 kg)   SpO2 94%   BMI 31.48 kg/m      09/13/2023    7:38 AM 09/02/2023    1:39 PM 08/31/2023    8:54 AM  BP/Weight  Systolic BP 102 122 118  Diastolic BP 64 75 76  Wt. (Lbs) 201    BMI 31.48 kg/m2      Physical Exam Vitals and nursing note reviewed.  Constitutional:      Appearance: He is  obese.  HENT:     Head: Normocephalic and atraumatic.     Nose: Rhinorrhea present.  Eyes:     Pupils: Pupils are equal, round, and reactive to light.  Cardiovascular:     Rate and Rhythm: Normal rate and regular rhythm.  Pulmonary:     Effort: Pulmonary effort is normal.     Breath sounds: Normal breath sounds.  Musculoskeletal:        General: No swelling.      Cervical back: Normal range of motion.  Skin:    General: Skin is dry.  Neurological:     General: No focal deficit present.     Mental Status: He is alert.  Psychiatric:        Mood and Affect: Mood normal.     Diabetic Foot Exam - Simple   No data filed      Lab Results  Component Value Date   WBC 7.4 09/02/2023   HGB 14.8 09/02/2023   HCT 45.2 09/02/2023   PLT 314 09/02/2023   GLUCOSE 107 (H) 09/02/2023   CHOL 144 03/09/2023   TRIG 90 03/09/2023   HDL 43 03/09/2023   LDLCALC 84 03/09/2023   ALT 13 03/09/2023   AST 20 03/09/2023   NA 143 09/02/2023   K 3.9 09/02/2023   CL 107 09/02/2023   CREATININE 1.36 (H) 09/02/2023   BUN 31 (H) 09/02/2023   CO2 29 09/02/2023   HGBA1C 6.5 (H) 03/09/2023   MICROALBUR 10 04/07/2021      Assessment & Plan:    Essential hypertension -     Comprehensive metabolic panel -     CBC with Differential/Platelet -     Lipid panel -     Hemoglobin A1c  Mixed hyperlipidemia -     Comprehensive metabolic panel -     CBC with Differential/Platelet -     Lipid panel -     Hemoglobin A1c  Type 2 diabetes mellitus with vascular disease (HCC) Assessment & Plan: Last HbA1c in July was 6.5%. Reports trying to be careful with diet but has been less active due to recent illness. Discussed the importance of healthy eating, including fresh foods and low sodium diet, and regular exercise to manage blood sugar levels. Blood work will be ordered to check current HbA1c. - Order blood work to check current HbA1c - Advise on healthy eating, including fresh foods and low sodium diet - Encourage regular exercise as tolerated   Orders: -     Comprehensive metabolic panel -     CBC with Differential/Platelet -     Lipid panel -     Hemoglobin A1c  Persistent cough Assessment & Plan: Persistent cough since January 8th, initially treated with Tamiflu for flu diagnosed on January 13th. Symptoms include body aches, fever, and cough. No pneumonia  on chest x-ray as of 08/22/23. Cough has improved slightly but persists, worse at night. Mild shortness of breath noted. Physical exam reveals no pneumonia or fluid in lungs, and minimal nasal drainage. Post-viral cough can take weeks to resolve. Nasal spray can help dry up drainage and reduce cough. If symptoms persist, a chest x-ray will be considered to rule out other causes. - Prescribe nasal spray FLONASE for postnasal drip - Continue Mucinex over the counter - If cough persists for a couple of weeks, order repeat chest x-ray   HFrEF (heart failure with reduced ejection fraction) [facility]) Assessment & Plan: Echocardiogram in December 2023 showed an ejection fraction of  25-30%.   Currently, no significant shortness of breath, able to walk five miles twice a week, no leg swelling, and normal heart and lung sounds on exam. Discussed the importance of a low sodium diet and regular monitoring of symptoms and weight to manage heart failure effectively. 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 Discussed the importance of flu vaccination and will consider it at a later date. - Consider flu vaccination at a later date, as recently had the flu  Follow-up - Advise on taking breaks during long drives to prevent blood clots - Recommend healthy food choices during travel - Follow up in three months.   Other orders -     Fluticasone Propionate; Place 2 sprays into both nostrils daily.  Dispense: 16 g; Refill: 6     Meds ordered this encounter   Medications   fluticasone (FLONASE) 50 MCG/ACT nasal spray    Sig: Place 2 sprays into both nostrils daily.    Dispense:  16 g    Refill:  6    Orders Placed This Encounter  Procedures   Comprehensive metabolic panel   CBC with Differential   Lipid Panel   Hemoglobin A1c     Follow-up: Return in about 3 months (around 12/12/2023) for chronic disease follow up.  An After Visit Summary was printed and given to the patient.  Windell Moment, MD [facility] [phone]

## 2023-09-13 NOTE — Patient Instructions (Signed)
VISIT SUMMARY:  During today's visit, we discussed your persistent cough, heart failure, and diabetes management. You have been experiencing a persistent cough since early January, following a flu diagnosis. We also reviewed your heart failure and diabetes management, and discussed general health maintenance, including the importance of flu vaccination.  YOUR PLAN:  -PERSISTENT COUGH: A persistent cough can occur after a viral infection like the flu and may take weeks to resolve. We will use a nasal spray to help with postnasal drip and continue with Mucinex. If the cough persists for a couple of weeks, we will consider a chest x-ray to rule out other causes.  -HEART FAILURE: Heart failure means your heart is not pumping blood as well as it should. Your recent echocardiogram showed an ejection fraction of 25-30%. It's important to monitor your symptoms and physical activity, follow a low sodium diet, and keep track of your weight to manage your condition effectively.  -DIABETES MELLITUS: Diabetes means your blood sugar levels are higher than normal. Your last HbA1c was 6.5%, which is a measure of your average blood sugar over three months. We discussed the importance of healthy eating, including fresh foods and a low sodium diet, and regular exercise to manage your blood sugar levels. We will order blood work to check your current HbA1c.  -GENERAL HEALTH MAINTENANCE: You did not receive a flu shot this year, which may have contributed to contracting the flu. We discussed the importance of flu vaccination and will consider it at a later date.  INSTRUCTIONS:  Please take breaks during long drives to prevent blood clots and make healthy food choices during travel. Follow up in three months.

## 2023-09-13 NOTE — Assessment & Plan Note (Signed)
Last HbA1c in July was 6.5%. Reports trying to be careful with diet but has been less active due to recent illness. Discussed the importance of healthy eating, including fresh foods and low sodium diet, and regular exercise to manage blood sugar levels. Blood work will be ordered to check current HbA1c. - Order blood work to check current HbA1c - Advise on healthy eating, including fresh foods and low sodium diet - Encourage regular exercise as tolerated

## 2023-09-13 NOTE — Assessment & Plan Note (Signed)
Echocardiogram in December 2023 showed an ejection fraction of 25-30%.   Currently, no significant shortness of breath, able to walk five miles twice a week, no leg swelling, and normal heart and lung sounds on exam. Discussed the importance of a low sodium diet and regular monitoring of symptoms and weight to manage heart failure effectively. - Monitor symptoms and physical activity - Advise on low sodium diet and weight monitoring - continue guideline directed medical therapy

## 2023-09-17 ENCOUNTER — Ambulatory Visit: Payer: Medicare HMO

## 2023-09-17 DIAGNOSIS — E782 Mixed hyperlipidemia: Secondary | ICD-10-CM | POA: Diagnosis not present

## 2023-09-17 DIAGNOSIS — E1159 Type 2 diabetes mellitus with other circulatory complications: Secondary | ICD-10-CM

## 2023-09-17 DIAGNOSIS — I10 Essential (primary) hypertension: Secondary | ICD-10-CM

## 2023-09-17 NOTE — Addendum Note (Signed)
Addended by: Tawny Asal I on: 09/17/2023 05:28 AM   Modules accepted: Orders

## 2023-09-18 LAB — COMPREHENSIVE METABOLIC PANEL
ALT: 15 [IU]/L (ref 0–44)
AST: 17 [IU]/L (ref 0–40)
Albumin: 3.5 g/dL — ABNORMAL LOW (ref 3.8–4.8)
Alkaline Phosphatase: 82 [IU]/L (ref 44–121)
BUN/Creatinine Ratio: 12 (ref 10–24)
BUN: 14 mg/dL (ref 8–27)
Bilirubin Total: 0.3 mg/dL (ref 0.0–1.2)
CO2: 21 mmol/L (ref 20–29)
Calcium: 9.1 mg/dL (ref 8.6–10.2)
Chloride: 112 mmol/L — ABNORMAL HIGH (ref 96–106)
Creatinine, Ser: 1.14 mg/dL (ref 0.76–1.27)
Globulin, Total: 2.2 g/dL (ref 1.5–4.5)
Glucose: 115 mg/dL — ABNORMAL HIGH (ref 70–99)
Potassium: 4.5 mmol/L (ref 3.5–5.2)
Sodium: 147 mmol/L — ABNORMAL HIGH (ref 134–144)
Total Protein: 5.7 g/dL — ABNORMAL LOW (ref 6.0–8.5)
eGFR: 66 mL/min/{1.73_m2} (ref >59)

## 2023-09-18 LAB — CBC WITH DIFFERENTIAL/PLATELET
Basophils Absolute: 0 10*3/uL (ref 0.0–0.2)
Basos: 1 %
EOS (ABSOLUTE): 0.1 10*3/uL (ref 0.0–0.4)
Eos: 3 %
Hematocrit: 38.5 % (ref 37.5–51.0)
Hemoglobin: 12.9 g/dL — ABNORMAL LOW (ref 13.0–17.7)
Immature Grans (Abs): 0 10*3/uL (ref 0.0–0.1)
Immature Granulocytes: 0 %
Lymphocytes Absolute: 1.6 10*3/uL (ref 0.7–3.1)
Lymphs: 46 %
MCH: 31.3 pg (ref 26.6–33.0)
MCHC: 33.5 g/dL (ref 31.5–35.7)
MCV: 93 fL (ref 79–97)
Monocytes Absolute: 0.4 10*3/uL (ref 0.1–0.9)
Monocytes: 12 %
Neutrophils Absolute: 1.4 10*3/uL (ref 1.4–7.0)
Neutrophils: 38 %
Platelets: 190 10*3/uL (ref 150–450)
RBC: 4.12 x10E6/uL — ABNORMAL LOW (ref 4.14–5.80)
RDW: 13.5 % (ref 11.6–15.4)
WBC: 3.5 10*3/uL (ref 3.4–10.8)

## 2023-09-18 LAB — HEMOGLOBIN A1C
Est. average glucose Bld gHb Est-mCnc: 148 mg/dL
Hgb A1c MFr Bld: 6.8 % — ABNORMAL HIGH (ref 4.8–5.6)

## 2023-09-18 LAB — LIPID PANEL
Chol/HDL Ratio: 4.4 {ratio} (ref 0.0–5.0)
Cholesterol, Total: 157 mg/dL (ref 100–199)
HDL: 36 mg/dL — ABNORMAL LOW (ref >39)
LDL Chol Calc (NIH): 96 mg/dL (ref 0–99)
Triglycerides: 141 mg/dL (ref 0–149)
VLDL Cholesterol Cal: 25 mg/dL (ref 5–40)

## 2023-11-22 ENCOUNTER — Ambulatory Visit

## 2023-12-03 ENCOUNTER — Emergency Department (HOSPITAL_BASED_OUTPATIENT_CLINIC_OR_DEPARTMENT_OTHER)

## 2023-12-03 ENCOUNTER — Encounter (HOSPITAL_BASED_OUTPATIENT_CLINIC_OR_DEPARTMENT_OTHER): Payer: Self-pay | Admitting: Emergency Medicine

## 2023-12-03 ENCOUNTER — Emergency Department (HOSPITAL_BASED_OUTPATIENT_CLINIC_OR_DEPARTMENT_OTHER)
Admission: EM | Admit: 2023-12-03 | Discharge: 2023-12-03 | Disposition: A | Discharge status: Home / Self Care | Attending: Emergency Medicine | Admitting: Emergency Medicine

## 2023-12-03 ENCOUNTER — Telehealth: Payer: Self-pay

## 2023-12-03 ENCOUNTER — Other Ambulatory Visit: Payer: Self-pay

## 2023-12-03 DIAGNOSIS — M542 Cervicalgia: Secondary | ICD-10-CM | POA: Diagnosis not present

## 2023-12-03 DIAGNOSIS — M4802 Spinal stenosis, cervical region: Secondary | ICD-10-CM | POA: Diagnosis not present

## 2023-12-03 DIAGNOSIS — R0989 Other specified symptoms and signs involving the circulatory and respiratory systems: Secondary | ICD-10-CM | POA: Diagnosis not present

## 2023-12-03 DIAGNOSIS — I77810 Thoracic aortic ectasia: Secondary | ICD-10-CM | POA: Diagnosis not present

## 2023-12-03 DIAGNOSIS — I2510 Atherosclerotic heart disease of native coronary artery without angina pectoris: Secondary | ICD-10-CM | POA: Diagnosis not present

## 2023-12-03 DIAGNOSIS — Z7982 Long term (current) use of aspirin: Secondary | ICD-10-CM | POA: Insufficient documentation

## 2023-12-03 DIAGNOSIS — I672 Cerebral atherosclerosis: Secondary | ICD-10-CM | POA: Diagnosis not present

## 2023-12-03 DIAGNOSIS — R079 Chest pain, unspecified: Secondary | ICD-10-CM | POA: Insufficient documentation

## 2023-12-03 DIAGNOSIS — R519 Headache, unspecified: Secondary | ICD-10-CM | POA: Diagnosis not present

## 2023-12-03 DIAGNOSIS — R0789 Other chest pain: Secondary | ICD-10-CM | POA: Diagnosis not present

## 2023-12-03 DIAGNOSIS — M5030 Other cervical disc degeneration, unspecified cervical region: Secondary | ICD-10-CM | POA: Diagnosis not present

## 2023-12-03 DIAGNOSIS — R9089 Other abnormal findings on diagnostic imaging of central nervous system: Secondary | ICD-10-CM | POA: Diagnosis not present

## 2023-12-03 LAB — CBC WITH DIFFERENTIAL/PLATELET
Abs Immature Granulocytes: 0 10*3/uL (ref 0.00–0.07)
Basophils Absolute: 0 10*3/uL (ref 0.0–0.1)
Basophils Relative: 0 %
Eosinophils Absolute: 0.1 10*3/uL (ref 0.0–0.5)
Eosinophils Relative: 1 %
HCT: 37.7 % — ABNORMAL LOW (ref 39.0–52.0)
Hemoglobin: 12.5 g/dL — ABNORMAL LOW (ref 13.0–17.0)
Immature Granulocytes: 0 %
Lymphocytes Relative: 33 %
Lymphs Abs: 1.8 10*3/uL (ref 0.7–4.0)
MCH: 31.2 pg (ref 26.0–34.0)
MCHC: 33.2 g/dL (ref 30.0–36.0)
MCV: 94 fL (ref 80.0–100.0)
Monocytes Absolute: 0.8 10*3/uL (ref 0.1–1.0)
Monocytes Relative: 16 %
Neutro Abs: 2.6 10*3/uL (ref 1.7–7.7)
Neutrophils Relative %: 50 %
Platelets: 189 10*3/uL (ref 150–400)
RBC: 4.01 MIL/uL — ABNORMAL LOW (ref 4.22–5.81)
RDW: 13.9 % (ref 11.5–15.5)
WBC: 5.4 10*3/uL (ref 4.0–10.5)
nRBC: 0 % (ref 0.0–0.2)

## 2023-12-03 LAB — TROPONIN I (HIGH SENSITIVITY)
Troponin I (High Sensitivity): 12 ng/L (ref <18)
Troponin I (High Sensitivity): 12 ng/L (ref <18)

## 2023-12-03 LAB — COMPREHENSIVE METABOLIC PANEL WITH GFR
ALT: 7 U/L (ref 0–44)
AST: 11 U/L — ABNORMAL LOW (ref 15–41)
Albumin: 3.8 g/dL (ref 3.5–5.0)
Alkaline Phosphatase: 69 U/L (ref 38–126)
Anion gap: 7 (ref 5–15)
BUN: 16 mg/dL (ref 8–23)
CO2: 27 mmol/L (ref 22–32)
Calcium: 9.5 mg/dL (ref 8.9–10.3)
Chloride: 107 mmol/L (ref 98–111)
Creatinine, Ser: 1.22 mg/dL (ref 0.61–1.24)
GFR, Estimated: >60 mL/min (ref >60)
Glucose, Bld: 138 mg/dL — ABNORMAL HIGH (ref 70–99)
Potassium: 3.9 mmol/L (ref 3.5–5.1)
Sodium: 141 mmol/L (ref 135–145)
Total Bilirubin: 0.5 mg/dL (ref 0.0–1.2)
Total Protein: 6.6 g/dL (ref 6.5–8.1)

## 2023-12-03 LAB — LIPASE, BLOOD: Lipase: 31 U/L (ref 11–51)

## 2023-12-03 MED ORDER — PANTOPRAZOLE SODIUM 40 MG PO TBEC
40.0000 mg | DELAYED_RELEASE_TABLET | Freq: Every day | ORAL | 0 refills | Status: DC
Start: 2023-12-03 — End: 2023-12-06

## 2023-12-03 MED ORDER — ALUM & MAG HYDROXIDE-SIMETH 200-200-20 MG/5ML PO SUSP
30.0000 mL | Freq: Once | ORAL | Status: AC
  Administered 2023-12-03: 30 mL via ORAL
  Filled 2023-12-03: qty 30

## 2023-12-03 MED ORDER — METHOCARBAMOL 500 MG PO TABS
500.0000 mg | ORAL_TABLET | Freq: Once | ORAL | Status: AC
  Administered 2023-12-03: 500 mg via ORAL
  Filled 2023-12-03: qty 1

## 2023-12-03 MED ORDER — FENTANYL CITRATE PF 50 MCG/ML IJ SOSY
100.0000 ug | PREFILLED_SYRINGE | Freq: Once | INTRAMUSCULAR | Status: AC
  Administered 2023-12-03: 100 ug via INTRAVENOUS
  Filled 2023-12-03: qty 2

## 2023-12-03 MED ORDER — IOHEXOL 350 MG/ML SOLN
100.0000 mL | Freq: Once | INTRAVENOUS | Status: AC | PRN
  Administered 2023-12-03: 100 mL via INTRAVENOUS

## 2023-12-03 MED ORDER — METHOCARBAMOL 500 MG PO TABS: 500.0000 mg | ORAL_TABLET | Freq: Two times a day (BID) | ORAL | 0 refills | Status: DC

## 2023-12-03 MED ORDER — LIDOCAINE VISCOUS HCL 2 % MT SOLN
15.0000 mL | Freq: Once | OROMUCOSAL | Status: AC
  Administered 2023-12-03: 15 mL via ORAL
  Filled 2023-12-03: qty 15
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
## 2023-12-03 NOTE — Telephone Encounter (Signed)
 Copied from CRM [id number]. Topic: General - Other >> Nov 29, 2023  5:16 PM Felizardo Hotter wrote: Reason for CRM: Pt called and would like to speak with someone regarding reinstating patient. Please call pt at [phone]

## 2023-12-03 NOTE — Telephone Encounter (Signed)
 Left a message on 11/30/23 for patient to call the office regarding the message that was sent to the office.
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
## 2023-12-04 ENCOUNTER — Telehealth: Payer: Self-pay

## 2023-12-04 NOTE — Transitions of Care (Post Inpatient/ED Visit) (Signed)
   12/04/2023  Name: Jeshawn Melucci Hodgdon MRN: [id number] DOB: [birth year]  Today's TOC FU Call Status: Today's TOC FU Call Status:: Unsuccessful Call (1st Attempt) Unsuccessful Call (1st Attempt) Date: 12/04/23  Attempted to reach the patient regarding the most recent Inpatient/ED visit.  Follow Up Plan: Additional outreach attempts will be made to reach the patient to complete the Transitions of Care (Post Inpatient/ED visit) call.   Signature  Seabron Cypress, LPN [facility] Advisor [facility] You Are. We Are. [facility] Direct Dial [phone]

## 2023-12-04 NOTE — Telephone Encounter (Signed)
 Copied from CRM [id number]. Topic: General - Call Back - No Documentation >> Dec 04, 2023 12:12 PM Zipporah Him wrote: Reason for CRM: Patient returning a missed call, notes indicate it was Mark [name] reaching out in regard to transitions of care. Please call back again when available.

## 2023-12-05 ENCOUNTER — Telehealth: Payer: Self-pay

## 2023-12-05 ENCOUNTER — Ambulatory Visit: Payer: Self-pay

## 2023-12-05 NOTE — Telephone Encounter (Signed)
 Copied from CRM [id number]. Topic: Clinical - Red Word Triage >> Dec 05, 2023  8:10 AM Zipporah Him wrote: Red Word that prompted transfer to Nurse Triage: Stiffness and pain in neck, consistent for about a week. Cannot sleep, can't turn his head.  Chief Complaint: neck pain Symptoms: neck pain 10/10, stiffness, swelling, 2-3 visible knots. Pain radiates to bilateral shoulders Frequency: Friday Pertinent Negatives: Patient denies fever, headache at present time, n/v Disposition: [] ED /[] Urgent Care (no appt availability in office) / [x] Appointment(In office/virtual)/ []  [facility]/ [] Home Care/ [] Refused Recommended Disposition /[] [facility]/ []  Follow-up with PCP Additional Notes:  pt states when neck pain first occurred it was just in the neck area. He did noticed a bad headache the same day however he reports feeling a burst or release of pressure when the headache disappeared. Now the neck pain radiates to bilateral shoulder area and neck is stiff: unable to turn neck from side to side. Pt stated he went to a [facility] the other day & they gave him some medication for it - stated in about 3 days it should be gone - however pt stated his s/s have gotten worse. Pt c/o neck swelling in areas but does not impact breathing or eating. Offered pt appt today with provider at PCP office: pt stated he had to work and would like to come early tomorrow.  Scheduled pt early morning appt tomorrow with provider w/n PCP office.  Reason for Disposition  Tenderness or swelling of front of neck over windpipe  Answer Assessment - Initial Assessment Questions 1. ONSET: "When did the pain begin?"      Friday 2. LOCATION: "Where does it hurt?"      neck 3. PATTERN "Does the pain come and go, or has it been constant since it started?"      constant 4. SEVERITY: "How bad is the pain?"  (Scale 1-10; or mild, moderate, severe)   - NO PAIN (0): no pain or only slight stiffness    - MILD  (1-3): doesn't interfere with normal activities    - MODERATE (4-7): interferes with normal activities or awakens from sleep    - SEVERE (8-10):  excruciating pain, unable to do any normal activities      10/10 5. RADIATION: "Does the pain go anywhere else, shoot into your arms?"     Bilateral shoulder 6. CORD SYMPTOMS: "Any weakness or numbness of the arms or legs?"     no 7. CAUSE: "What do you think is causing the neck pain?"     unknown 8. NECK OVERUSE: "Any recent activities that involved turning or twisting the neck?" no 9. OTHER SYMPTOMS: "Do you have any other symptoms?" (e.g., headache, fever, chest pain, difficulty breathing, neck swelling)     2-3 knots & neck swelling, neck stiffness  10. PREGNANCY: "Is there any chance you are pregnant?" "When was your last menstrual period?"       N /a  Protocols used: Neck Pain or Stiffness-A-AH

## 2023-12-05 NOTE — Telephone Encounter (Signed)
 I called patient to reschedule his AWV. Left message for patient to call our office to reschedule AWV.

## 2023-12-05 NOTE — Telephone Encounter (Signed)
 Patient called back and rescheduled for his AWV.

## 2023-12-06 ENCOUNTER — Ambulatory Visit (INDEPENDENT_AMBULATORY_CARE_PROVIDER_SITE_OTHER): Admitting: Family Medicine

## 2023-12-06 ENCOUNTER — Encounter: Payer: Self-pay | Admitting: Family Medicine

## 2023-12-06 VITALS — BP 102/72 | HR 69 | Temp 97.5°F | Ht 67.0 in | Wt 195.0 lb

## 2023-12-06 DIAGNOSIS — M542 Cervicalgia: Secondary | ICD-10-CM

## 2023-12-06 DIAGNOSIS — H6123 Impacted cerumen, bilateral: Secondary | ICD-10-CM | POA: Insufficient documentation

## 2023-12-06 DIAGNOSIS — M47812 Spondylosis without myelopathy or radiculopathy, cervical region: Secondary | ICD-10-CM

## 2023-12-06 MED ORDER — OXYCODONE-ACETAMINOPHEN 10-325 MG PO TABS: 1.0000 | ORAL_TABLET | Freq: Three times a day (TID) | ORAL | 0 refills | Status: AC | PRN

## 2023-12-06 NOTE — Assessment & Plan Note (Signed)
 Inquired about ear cleaning due to hearing difficulties. Discussed ear irrigation for earwax removal. - Perform ear irrigation to clean out earwax, tolerated well with no complications

## 2023-12-06 NOTE — Assessment & Plan Note (Signed)
 Advanced cervical spine degeneration on imaging, likely contributing to neck pain.  - referral to ortho if pain is unresolved by next week.

## 2023-12-06 NOTE — Assessment & Plan Note (Signed)
 Acute neck pain likely due to cervical spine degeneration and poor sleeping posture. Imaging ruled out myocardial infarction or cerebrovascular accident. Methocarbamol  was ineffective. Discussed risks of oxycodone  including addiction and CNS depression. - Prescribe oxycodone  with acetaminophen  (Percocet) for five days, every eight hours as needed. - Advise use of heat or ice on the neck, whichever provides more relief. - Recommend topical application of Icy Hot or Tiger Balm to relax neck muscles. - Instruct to massage neck to relieve muscle tension. - Advise to return if pain persists after five days for possible referral to orthopedic specialist and consideration of physical therapy.

## 2023-12-06 NOTE — Progress Notes (Signed)
 Acute Office Visit  Subjective:    Patient ID: Mark [name], male    DOB: 1945-11-19, 78 y.o.   MRN: [id number]  Chief Complaint  Patient presents with   Neck Pain    HPI: Discussed the use of AI scribe software for clinical note transcription with the patient, who gave verbal consent to proceed.  History of Present Illness   Mark [name] is a 78 year old male with cervical spine degeneration who presents with neck pain.  He has been experiencing neck pain since last Friday morning, which began with an inability to move his neck properly upon waking. The pain persisted throughout the day at work and intensified by Monday morning, preventing him from turning his neck. He sought medical attention at a hospital where methocarbamol  500 mg was prescribed, but it did not alleviate his pain.  He has had similar episodes of neck pain once or twice before, which were effectively treated with a medication that provided significant relief. He recalls receiving a pill at [facility] that resolved the pain before leaving the facility.  He underwent x-rays and a full workup at the hospital, which ruled out heart attacks and strokes. He was informed of advanced cervical spine degeneration. Despite receiving fentanyl  through an IV during his hospital visit, he reports no relief from the pain. The pain has since radiated to his shoulders, making it difficult to drive as he cannot turn his neck properly.  He is currently on several medications including inhalers, cholesterol medication, blood pressure medication, metformin , Protonix  for reflux, and spironolactone . He also has some hearing difficulties and wants to have his ears cleaned out, as he has never had this done before.  No chest pain or shortness of breath. Reports neck pain radiating to shoulders, difficulty driving due to limited neck movement, and hearing difficulties.     Past Medical History:  Diagnosis Date   Acute renal failure  superimposed on stage 3a chronic kidney disease (HCC) 12/08/2020   Arthritis    Atherosclerotic heart disease of native coronary artery with angina pectoris (HCC) 07/20/2017   BMI 31.0-31.9,adult 03/29/2020   BMI 32.0-32.9,adult 03/29/2020   Cardiac murmur 03/02/2022   Cardiomyopathy (HCC) 03/16/2022   Cervical spondylosis 01/11/2022   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   ED (erectile dysfunction) 11/27/2019   Essential hypertension    Floaters 05/02/2022   HFrEF (heart failure with reduced ejection fraction) (HCC) 03/16/2022   History of BPH    Hyperlipidemia 07/20/2017   LBBB (left bundle branch block) 12/27/2021   Osteoarthritis of right knee 12/08/2020   Peripheral vascular disease (HCC)    Type 2 diabetes mellitus with vascular disease (HCC)     Past Surgical History:  Procedure Laterality Date   HERNIA REPAIR     INGUINAL HERNIA REPAIR Bilateral    LEFT HEART CATH AND CORONARY ANGIOGRAPHY N/A 03/23/2022   Procedure: LEFT HEART CATH AND CORONARY ANGIOGRAPHY;  Surgeon: [place], Peter M, MD;  Location: [facility];  Service: Cardiovascular;  Laterality: N/A;    Family History  Problem Relation Age of Onset   Cancer Mother    Hypertension Neg Hx    Heart disease Neg Hx    Diabetes Neg Hx     Social History   Socioeconomic History   Marital status: Single    Spouse name: Not on file   Number of children: 4   Years of education: 44   Highest education level: 12th grade  Occupational History  Occupation: retired   Tobacco Use   Smoking status: Never   Smokeless tobacco: Never  Vaping Use   Vaping status: Never Used  Substance and Sexual Activity   Alcohol  use: No   Drug use: No   Sexual activity: Not Currently  Other Topics Concern   Not on file  Social History Narrative   Lives in a hotel and feels safe with his living arrangements.    Social Drivers of Corporate investment banker Strain: Low Risk  (09/13/2023)   Overall Financial Resource  Strain (CARDIA)    Difficulty of Paying Living Expenses: Not very hard  Food Insecurity: No Food Insecurity (09/13/2023)   Hunger Vital Sign    Worried About Running Out of Food in the Last Year: Never true    Ran Out of Food in the Last Year: Never true  Transportation Needs: No Transportation Needs (09/13/2023)   PRAPARE - Administrator, Civil Service (Medical): No    Lack of Transportation (Non-Medical): No  Physical Activity: Sufficiently Active (09/13/2023)   Exercise Vital Sign    Days of Exercise per Week: 4 days    Minutes of Exercise per Session: 60 min  Stress: No Stress Concern Present (10/12/2022)   [employer] of Occupational Health - Occupational Stress Questionnaire    Feeling of Stress : Not at all  Social Connections: Moderately Isolated (09/13/2023)   Social Connection and Isolation Panel [NHANES]    Frequency of Communication with Friends and Family: More than three times a week    Frequency of Social Gatherings with Friends and Family: Twice a week    Attends Religious Services: 1 to 4 times per year    Active Member of [employer] or Organizations: No    Attends Banker Meetings: Never    Marital Status: Never married  Intimate Partner Violence: Not At Risk (09/13/2023)   Humiliation, Afraid, Rape, and Kick questionnaire    Fear of Current or Ex-Partner: No    Emotionally Abused: No    Physically Abused: No    Sexually Abused: No    Outpatient Medications Prior to Visit  Medication Sig Dispense Refill   albuterol  (VENTOLIN  HFA) 108 (90 Base) MCG/ACT inhaler Inhale 1-2 puffs into the lungs as needed for wheezing or shortness of breath.     Alcohol  Swabs  (DROPSAFE ALCOHOL  PREP) 70 % PADS 1 each by Does not apply route 2 (two) times daily. as directed 200 each 3   aspirin  81 MG chewable tablet Chew 81 mg by mouth daily.     atorvastatin  (LIPITOR) 20 MG tablet Take 1 tablet (20 mg total) by mouth daily. 90 tablet 2   Blood Glucose Calibration  (TRUE METRIX LEVEL 1) Low SOLN 1 each by In Vitro route every 30 (thirty) days. 1 each 2   Blood Glucose Monitoring Suppl (TRUE METRIX AIR GLUCOSE METER) w/Device KIT 1 each by Does not apply route in the morning and at bedtime. 1 kit 0   carvedilol  (COREG ) 6.25 MG tablet TAKE 1 TABLET EVERY DAY 90 tablet 0   fluticasone  (FLONASE ) 50 MCG/ACT nasal spray Place 2 sprays into both nostrils daily. 16 g 6   glucose blood (TRUE METRIX BLOOD GLUCOSE TEST) test strip TEST BLOOD SUGAR IN THE MORNING AND AT BEDTIME AS INSTRUCTED 200 strip 3   ipratropium (ATROVENT  HFA) 17 MCG/ACT inhaler Inhale 2 puffs into the lungs every 4 (four) hours as needed for wheezing. 1 each 12   Magnesium 500 MG  TABS Take 500 mg by mouth in the morning.     metFORMIN  (GLUCOPHAGE ) 500 MG tablet TAKE 1/2 TABLET TWICE DAILY 90 tablet 0   methocarbamol  (ROBAXIN ) 500 MG tablet Take 500 mg by mouth in the morning and at bedtime.     nitroGLYCERIN  (NITROSTAT ) 0.4 MG SL tablet DISSOLVE ONE TABLET UNDER TONGUE AS NEEDED FOR CHEST PAIN UP TO 3 TABLETS 25 tablet 11   pantoprazole  (PROTONIX ) 40 MG tablet Take 40 mg by mouth daily.     spironolactone  (ALDACTONE ) 25 MG tablet TAKE 1/2 TABLET EVERY DAY 45 tablet 0   TRUEplus Lancets 30G MISC TEST BLOOD SUGAR IN THE MORNING AND AT BEDTIME. 200 each 3   Baclofen  5 MG TABS Take 1 tablet (5 mg total) by mouth 2 (two) times daily as needed. 20 tablet 0   empagliflozin  (JARDIANCE ) 10 MG TABS tablet Take 1 tablet (10 mg total) by mouth daily before breakfast.     furosemide (LASIX) 20 MG tablet TAKE 1 TABLET EVERY OTHER DAY 45 tablet 3   methocarbamol  (ROBAXIN ) 500 MG tablet Take 1 tablet (500 mg total) by mouth 2 (two) times daily. 20 tablet 0   benzonatate  (TESSALON ) 200 MG capsule Take 1 capsule (200 mg total) by mouth every 8 (eight) hours. 30 capsule 0   pantoprazole  (PROTONIX ) 40 MG tablet Take 1 tablet (40 mg total) by mouth daily. 10 tablet 0   No facility-administered medications prior to  visit.    Allergies  Allergen Reactions   Penicillins Rash    Can take by mouth, can not take injections will break out in a rash    Review of Systems  Constitutional:  Negative for chills, fatigue and fever.  HENT:  Positive for hearing loss (trouble hearing). Negative for congestion, ear pain, sinus pressure and sore throat.   Respiratory:  Negative for cough and shortness of breath.   Cardiovascular:  Negative for chest pain.  Gastrointestinal:  Negative for abdominal pain, constipation, diarrhea, nausea and vomiting.  Genitourinary:  Negative for dysuria and frequency.  Musculoskeletal:  Positive for neck pain. Negative for arthralgias, back pain and myalgias.  Neurological:  Negative for dizziness and headaches.  Psychiatric/Behavioral:  Negative for dysphoric mood. The patient is not nervous/anxious.        Objective:        12/06/2023    7:22 AM 12/03/2023    8:52 AM 12/03/2023    6:30 AM  Vitals with BMI  Height 5\' 7"     Weight 195 lbs    BMI 30.53    Systolic 102 126 098  Diastolic 72 72 88  Pulse 69 65 67    No data found.   Physical Exam Vitals reviewed.  Constitutional:      General: He is not in acute distress.    Appearance: Normal appearance. He is not ill-appearing.  HENT:     Right Ear: Decreased hearing noted. There is impacted cerumen.     Left Ear: Decreased hearing noted. There is impacted cerumen.  Eyes:     Conjunctiva/sclera: Conjunctivae normal.  Cardiovascular:     Rate and Rhythm: Normal rate and regular rhythm.     Heart sounds: Normal heart sounds. No murmur heard. Pulmonary:     Effort: Pulmonary effort is normal.     Breath sounds: Normal breath sounds. No wheezing.  Musculoskeletal:     Cervical back: Edema present. Pain with movement present. Decreased range of motion.  Neurological:     Mental  Status: He is alert. Mental status is at baseline.  Psychiatric:        Mood and Affect: Mood normal.        Behavior: Behavior  normal.     Health Maintenance Due  Topic Date Due   FOOT EXAM  05/03/2023   OPHTHALMOLOGY EXAM  05/04/2023   Medicare Annual Wellness (AWV)  10/12/2023   Diabetic kidney evaluation - Urine ACR  11/01/2023    There are no preventive care reminders to display for this patient.   No results found for: "TSH" Lab Results  Component Value Date   WBC 5.4 12/03/2023   HGB 12.5 (L) 12/03/2023   HCT 37.7 (L) 12/03/2023   MCV 94.0 12/03/2023   PLT 189 12/03/2023   Lab Results  Component Value Date   NA 141 12/03/2023   K 3.9 12/03/2023   CO2 27 12/03/2023   GLUCOSE 138 (H) 12/03/2023   BUN 16 12/03/2023   CREATININE 1.22 12/03/2023   BILITOT 0.5 12/03/2023   ALKPHOS 69 12/03/2023   AST 11 (L) 12/03/2023   ALT 7 12/03/2023   PROT 6.6 12/03/2023   ALBUMIN 3.8 12/03/2023   CALCIUM  9.5 12/03/2023   ANIONGAP 7 12/03/2023   EGFR 66 09/17/2023   Lab Results  Component Value Date   CHOL 157 09/17/2023   Lab Results  Component Value Date   HDL 36 (L) 09/17/2023   Lab Results  Component Value Date   LDLCALC 96 09/17/2023   Lab Results  Component Value Date   TRIG 141 09/17/2023   Lab Results  Component Value Date   CHOLHDL 4.4 09/17/2023   Lab Results  Component Value Date   HGBA1C 6.8 (H) 09/17/2023       Assessment & Plan:  Neck pain Assessment & Plan: Acute neck pain likely due to cervical spine degeneration and poor sleeping posture. Imaging ruled out myocardial infarction or cerebrovascular accident. Methocarbamol  was ineffective. Discussed risks of oxycodone  including addiction and CNS depression. - Prescribe oxycodone  with acetaminophen  (Percocet) for five days, every eight hours as needed. - Advise use of heat or ice on the neck, whichever provides more relief. - Recommend topical application of Icy Hot or Tiger Balm to relax neck muscles. - Instruct to massage neck to relieve muscle tension. - Advise to return if pain persists after five days for  possible referral to orthopedic specialist and consideration of physical therapy.  Orders: -     oxyCODONE -Acetaminophen ; Take 1 tablet by mouth every 8 (eight) hours as needed for up to 5 days for pain.  Dispense: 15 tablet; Refill: 0  Cervical spondylosis Assessment & Plan: Advanced cervical spine degeneration on imaging, likely contributing to neck pain.  - referral to ortho if pain is unresolved by next week.   Impacted cerumen of both ears Assessment & Plan: Inquired about ear cleaning due to hearing difficulties. Discussed ear irrigation for earwax removal. - Perform ear irrigation to clean out earwax, tolerated well with no complications            Meds ordered this encounter  Medications   oxyCODONE -acetaminophen  (PERCOCET) 10-325 MG tablet    Sig: Take 1 tablet by mouth every 8 (eight) hours as needed for up to 5 days for pain.    Dispense:  15 tablet    Refill:  0    No orders of the defined types were placed in this encounter.   Follow-up: Return if symptoms worsen or fail to improve.  An After  Visit Summary was printed and given to the patient.  Delford Felling, FNP [facility] [phone]

## 2023-12-17 ENCOUNTER — Ambulatory Visit (INDEPENDENT_AMBULATORY_CARE_PROVIDER_SITE_OTHER): Payer: Medicare HMO

## 2023-12-17 VITALS — BP 122/78 | HR 60 | Temp 98.2°F | Ht 67.0 in | Wt 199.2 lb

## 2023-12-17 DIAGNOSIS — E1159 Type 2 diabetes mellitus with other circulatory complications: Secondary | ICD-10-CM

## 2023-12-17 DIAGNOSIS — E118 Type 2 diabetes mellitus with unspecified complications: Secondary | ICD-10-CM

## 2023-12-17 DIAGNOSIS — I25119 Atherosclerotic heart disease of native coronary artery with unspecified angina pectoris: Secondary | ICD-10-CM | POA: Diagnosis not present

## 2023-12-17 DIAGNOSIS — I10 Essential (primary) hypertension: Secondary | ICD-10-CM | POA: Diagnosis not present

## 2023-12-17 DIAGNOSIS — E782 Mixed hyperlipidemia: Secondary | ICD-10-CM

## 2023-12-17 LAB — CBC WITH DIFFERENTIAL/PLATELET
Basophils Absolute: 0 10*3/uL (ref 0.0–0.2)
Basos: 1 %
EOS (ABSOLUTE): 0.1 10*3/uL (ref 0.0–0.4)
Eos: 3 %
Hematocrit: 40.6 % (ref 37.5–51.0)
Hemoglobin: 13.6 g/dL (ref 13.0–17.7)
Immature Grans (Abs): 0 10*3/uL (ref 0.0–0.1)
Immature Granulocytes: 0 %
Lymphocytes Absolute: 1.7 10*3/uL (ref 0.7–3.1)
Lymphs: 45 %
MCH: 31.6 pg (ref 26.6–33.0)
MCHC: 33.5 g/dL (ref 31.5–35.7)
MCV: 94 fL (ref 79–97)
Monocytes Absolute: 0.4 10*3/uL (ref 0.1–0.9)
Monocytes: 11 %
Neutrophils Absolute: 1.5 10*3/uL (ref 1.4–7.0)
Neutrophils: 40 %
Platelets: 310 10*3/uL (ref 150–450)
RBC: 4.31 x10E6/uL (ref 4.14–5.80)
RDW: 13.5 % (ref 11.6–15.4)
WBC: 3.7 10*3/uL (ref 3.4–10.8)

## 2023-12-17 LAB — HEMOGLOBIN A1C
Est. average glucose Bld gHb Est-mCnc: 137 mg/dL
Hgb A1c MFr Bld: 6.4 % — ABNORMAL HIGH (ref 4.8–5.6)

## 2023-12-17 MED ORDER — SPIRONOLACTONE 25 MG PO TABS: 25.0000 mg | ORAL_TABLET | Freq: Every day | ORAL | 1 refills | Status: DC

## 2023-12-17 MED ORDER — METFORMIN HCL 500 MG PO TABS: 500.0000 mg | ORAL_TABLET | Freq: Two times a day (BID) | ORAL | 1 refills | Status: DC

## 2023-12-17 MED ORDER — CARVEDILOL 6.25 MG PO TABS: 6.2500 mg | ORAL_TABLET | Freq: Every day | ORAL | 1 refills | Status: DC

## 2023-12-17 NOTE — Assessment & Plan Note (Signed)
 Type 2 diabetes with recent A1c of 6.8% indicating reasonable control. No symptoms of neuropathy or other complications. He has not had an eye exam this year and is advised to have one annually due to diabetes. - Continue metformin  500 mg, one tablet twice daily - Recommend annual eye exam

## 2023-12-17 NOTE — Assessment & Plan Note (Addendum)
 Hyperlipidemia managed with atorvastatin . Recent cholesterol levels were not concerning. - Continue atorvastatin  20 mg daily  No chest pain with exertion    Anemia Mild anemia with hemoglobin levels decreasing from 12.9 to 12.5. Monitoring for potential blood loss. Plan to recheck hemoglobin levels to ensure no underlying issues. - Order blood work to recheck hemoglobin levels  Neck pain Intermittent soreness on the left side of the neck, improved with stretching exercises. He reports regular stretching and occasional soreness. - Recommend regular neck stretching exercises - Use Tylenol  as needed for pain relief

## 2023-12-17 NOTE — Assessment & Plan Note (Signed)
 Hypertension managed with carvedilol . He prefers current regimen of 6.25 mg once daily despite recommendation for twice daily dosing. Discussed that the medication is not a 24-hour pill and typically requires twice daily dosing, but he prefers once daily to avoid missing doses. - Continue carvedilol  6.25 mg once daily

## 2023-12-17 NOTE — Progress Notes (Signed)
 Subjective:  Patient ID: Mark [name], male    DOB: [birth year]  Age: 78 y.o. MRN: [id number]  Chief Complaint  Patient presents with   Medical Management of Chronic Issues    HPI:  Discussed the use of AI scribe software for clinical note transcription with the patient, who gave verbal consent to proceed.  History of Present Illness   Mark [name] is a 78 year old male with diabetes and cardiomyopathy who presents for medication refills and blood work.  He experiences intermittent soreness on the left side of his neck, describing it as a knot that occasionally bothers him. The soreness is not constant, and he maintains full range of motion in his neck. He engages in regular stretching exercises to manage the discomfort.  He is due for medication refills and blood work. His last blood work on December 03, 2023, showed good electrolyte levels, decent kidney function, good liver numbers, and a reasonable blood count with a hemoglobin level of 12.5. His hemoglobin has been slightly decreasing since February 2025, when it was 12.9. His A1c was 6.8 in February 2025, indicating reasonably controlled diabetes.  He takes atorvastatin  20 mg daily for cholesterol, carvedilol  6.25 mg once daily for blood pressure and cardiomyopathy, metformin  500 mg twice daily for diabetes, and spironolactone  25 mg once daily. He has two inhalers but has not needed to use them recently.  No tingling or numbness in his feet and has good sensation. No chest pain or trouble breathing when walking. He tries to be careful with sodium intake, although he finds it challenging. His bowel movements are regular.         12/06/2023    7:28 AM 09/13/2023    7:45 AM 03/09/2023    8:01 AM 10/12/2022    8:43 AM 09/05/2022    8:22 AM  Depression screen PHQ 2/9  Decreased Interest 0 0 2 0 0  Down, Depressed, Hopeless 0 0 0 0 0  PHQ - 2 Score 0 0 2 0 0  Altered sleeping   3    Tired, decreased energy   0    Change in  appetite   0    Feeling bad or failure about yourself    0    Trouble concentrating   0    Moving slowly or fidgety/restless   0    Suicidal thoughts   0    PHQ-9 Score   5    Difficult doing work/chores   Not difficult at all          12/06/2023    7:28 AM  Fall Risk   Falls in the past year? 0  Number falls in past yr: 0  Injury with Fall? 0  Risk for fall due to : No Fall Risks    Patient Care Team: Tawyna Pellot, MD as PCP - General (Family Medicine)   Review of Systems  Constitutional:  Negative for chills, fatigue and fever.  HENT:  Negative for congestion, ear pain, sinus pressure and sore throat.   Respiratory:  Negative for cough and shortness of breath.   Cardiovascular:  Negative for chest pain.  Gastrointestinal:  Negative for abdominal pain, constipation, diarrhea, nausea and vomiting.  Genitourinary:  Negative for dysuria and frequency.  Musculoskeletal:  Positive for neck pain. Negative for arthralgias, back pain and myalgias.  Neurological:  Negative for dizziness and headaches.  Psychiatric/Behavioral:  Negative for dysphoric mood. The patient is not nervous/anxious.     Current  Outpatient Medications on File Prior to Visit  Medication Sig Dispense Refill   albuterol  (VENTOLIN  HFA) 108 (90 Base) MCG/ACT inhaler Inhale 1-2 puffs into the lungs as needed for wheezing or shortness of breath.     Alcohol  Swabs  (DROPSAFE ALCOHOL  PREP) 70 % PADS 1 each by Does not apply route 2 (two) times daily. as directed 200 each 3   aspirin  81 MG chewable tablet Chew 81 mg by mouth daily.     atorvastatin  (LIPITOR) 20 MG tablet Take 1 tablet (20 mg total) by mouth daily. 90 tablet 2   Blood Glucose Calibration (TRUE METRIX LEVEL 1) Low SOLN 1 each by In Vitro route every 30 (thirty) days. 1 each 2   Blood Glucose Monitoring Suppl (TRUE METRIX AIR GLUCOSE METER) w/Device KIT 1 each by Does not apply route in the morning and at bedtime. 1 kit 0   fluticasone  (FLONASE ) 50  MCG/ACT nasal spray Place 2 sprays into both nostrils daily. 16 g 6   glucose blood (TRUE METRIX BLOOD GLUCOSE TEST) test strip TEST BLOOD SUGAR IN THE MORNING AND AT BEDTIME AS INSTRUCTED 200 strip 3   ipratropium (ATROVENT  HFA) 17 MCG/ACT inhaler Inhale 2 puffs into the lungs every 4 (four) hours as needed for wheezing. 1 each 12   Magnesium 500 MG TABS Take 500 mg by mouth in the morning.     methocarbamol  (ROBAXIN ) 500 MG tablet Take 500 mg by mouth in the morning and at bedtime.     nitroGLYCERIN  (NITROSTAT ) 0.4 MG SL tablet DISSOLVE ONE TABLET UNDER TONGUE AS NEEDED FOR CHEST PAIN UP TO 3 TABLETS 25 tablet 11   pantoprazole  (PROTONIX ) 40 MG tablet Take 40 mg by mouth daily.     TRUEplus Lancets 30G MISC TEST BLOOD SUGAR IN THE MORNING AND AT BEDTIME. 200 each 3   No current facility-administered medications on file prior to visit.   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   ED (erectile dysfunction) 11/27/2019   Essential hypertension    Floaters 05/02/2022   HFrEF (heart failure with reduced ejection fraction) (HCC) 03/16/2022   History of BPH    Hyperlipidemia 07/20/2017   LBBB (left bundle branch block) 12/27/2021   Osteoarthritis of right knee 12/08/2020   Peripheral vascular disease (HCC)    Type 2 diabetes mellitus with vascular disease (HCC)    Past Surgical History:  Procedure Laterality Date   HERNIA REPAIR     INGUINAL HERNIA REPAIR Bilateral    LEFT HEART CATH AND CORONARY ANGIOGRAPHY N/A 03/23/2022   Procedure: LEFT HEART CATH AND CORONARY ANGIOGRAPHY;  Surgeon: [place],  Peter M, MD;  Location: [facility];  Service: Cardiovascular;  Laterality: N/A;    Family History  Problem Relation Age of Onset   Cancer Mother    Hypertension Neg Hx    Heart disease Neg Hx    Diabetes Neg Hx    Social History   Socioeconomic History   Marital status: Single    Spouse name: Not on file   Number of children: 4   Years of education: 17   Highest education level: 12th grade  Occupational History   Occupation: retired   Tobacco Use   Smoking status: Never   Smokeless tobacco: Never  Vaping Use   Vaping status: Never Used  Substance and Sexual Activity   Alcohol  use: No   Drug use: No   Sexual activity: Not Currently  Other Topics Concern   Not on file  Social History Narrative   Lives in a hotel and feels safe with his living arrangements.    Social Drivers of Corporate investment banker Strain: Low Risk  (09/13/2023)   Overall Financial Resource Strain (CARDIA)    Difficulty of Paying Living Expenses: Not very hard  Food Insecurity: No Food Insecurity (09/13/2023)   Hunger Vital Sign    Worried About Running Out of Food in the Last Year: Never true    Ran Out of Food in the Last Year: Never true  Transportation Needs: No Transportation Needs (09/13/2023)   PRAPARE - Administrator, Civil Service (Medical): No    Lack of Transportation (Non-Medical): No  Physical Activity: Sufficiently Active (09/13/2023)   Exercise Vital Sign    Days of Exercise per Week: 4 days    Minutes of Exercise per Session: 60 min  Stress: No Stress Concern Present (10/12/2022)   [employer] of Occupational Health - Occupational Stress Questionnaire    Feeling of Stress : Not at all  Social Connections: Moderately Isolated (09/13/2023)   Social Connection and Isolation Panel [NHANES]    Frequency of Communication with Friends and Family: More than three times a week    Frequency of Social Gatherings with Friends and Family: Twice a week    Attends  Religious Services: 1 to 4 times per year    Active Member of [employer] or Organizations: No    Attends Engineer, structural: Never    Marital Status: Never married    Objective:  BP 122/78   Pulse 60   Temp 98.2 F (36.8 C)   Ht 5\' 7"  (1.702 m)   Wt 199 lb 3.2 oz (90.4 kg)   SpO2 98%   BMI 31.20 kg/m      12/17/2023    7:29 AM 12/06/2023    7:22 AM 12/03/2023    8:52 AM  BP/Weight  Systolic BP 122 102 126  Diastolic BP 78 72 72  Wt. (Lbs) 199.2 195   BMI 31.2 kg/m2 30.54 kg/m2     Physical Exam Vitals and nursing note reviewed.  Constitutional:      Appearance: Normal appearance.  Cardiovascular:     Rate and Rhythm: Normal rate and regular rhythm.  Pulmonary:     Effort: Pulmonary effort is normal.     Breath sounds: Normal breath sounds.  Musculoskeletal:        General: Normal range of motion.  Neurological:     General: No focal deficit present.     Mental Status: He is alert.  Psychiatric:        Mood and Affect: Mood normal.     Diabetic Foot Exam - Simple   No data filed      Lab Results  Component Value Date   WBC 5.4 12/03/2023   HGB 12.5 (L) 12/03/2023   HCT 37.7 (L) 12/03/2023   PLT 189 12/03/2023   GLUCOSE 138 (H) 12/03/2023   CHOL 157 09/17/2023   TRIG 141 09/17/2023   HDL 36 (L) 09/17/2023   LDLCALC 96 09/17/2023   ALT 7 12/03/2023   AST 11 (L) 12/03/2023  NA 141 12/03/2023   K 3.9 12/03/2023   CL 107 12/03/2023   CREATININE 1.22 12/03/2023   BUN 16 12/03/2023   CO2 27 12/03/2023   HGBA1C 6.8 (H) 09/17/2023   MICROALBUR 10 04/07/2021      Assessment & Plan:  Mixed hyperlipidemia Assessment & Plan: Hyperlipidemia managed with atorvastatin . Recent cholesterol levels were not concerning. - Continue atorvastatin  20 mg daily   Type 2 diabetes mellitus with vascular disease (HCC) Assessment & Plan: Type 2 diabetes with recent A1c of 6.8% indicating reasonable control. No symptoms of neuropathy or other complications. He  has not had an eye exam this year and is advised to have one annually due to diabetes. - Continue metformin  500 mg, one tablet twice daily - Recommend annual eye exam  Orders: -     metFORMIN  HCl; Take 1 tablet (500 mg total) by mouth 2 (two) times daily.  Dispense: 90 tablet; Refill: 1 -     Hemoglobin A1c  Essential hypertension Assessment & Plan: Hypertension managed with carvedilol . He prefers current regimen of 6.25 mg once daily despite recommendation for twice daily dosing. Discussed that the medication is not a 24-hour pill and typically requires twice daily dosing, but he prefers once daily to avoid missing doses. - Continue carvedilol  6.25 mg once daily  Orders: -     CBC with Differential/Platelet  Controlled type 2 diabetes mellitus with complication, without long-term current use of insulin (HCC) Assessment & Plan: Well controlled. A1C 6.8 as of last blood work. Will repeat today Complications of vascular disease which is well controlled at this time   Atherosclerosis of native coronary artery of native heart with angina pectoris [facility]) Assessment & Plan: Hyperlipidemia managed with atorvastatin . Recent cholesterol levels were not concerning. - Continue atorvastatin  20 mg daily  No chest pain with exertion    Anemia Mild anemia with hemoglobin levels decreasing from 12.9 to 12.5. Monitoring for potential blood loss. Plan to recheck hemoglobin levels to ensure no underlying issues. - Order blood work to recheck hemoglobin levels  Neck pain Intermittent soreness on the left side of the neck, improved with stretching exercises. He reports regular stretching and occasional soreness. - Recommend regular neck stretching exercises - Use Tylenol  as needed for pain relief    Other orders -     Carvedilol ; Take 1 tablet (6.25 mg total) by mouth daily.  Dispense: 90 tablet; Refill: 1 -     Spironolactone ; Take 1 tablet (25 mg total) by mouth daily.  Dispense: 90 tablet;  Refill: 1    Assessment and Plan           Meds ordered this encounter  Medications   carvedilol  (COREG ) 6.25 MG tablet    Sig: Take 1 tablet (6.25 mg total) by mouth daily.    Dispense:  90 tablet    Refill:  1    Patient needs an appointment for further refills   metFORMIN  (GLUCOPHAGE ) 500 MG tablet    Sig: Take 1 tablet (500 mg total) by mouth 2 (two) times daily.    Dispense:  90 tablet    Refill:  1    Patient needs an appointment for further refills   spironolactone  (ALDACTONE ) 25 MG tablet    Sig: Take 1 tablet (25 mg total) by mouth daily.    Dispense:  90 tablet    Refill:  1    Orders Placed This Encounter  Procedures   CBC with Differential   Hemoglobin A1c  Follow-up: Return in about 6 months (around 06/18/2024) for chronic disease follow up.    An After Visit Summary was printed and given to the patient.  Tylisa Alcivar, MD [facility] [phone]

## 2023-12-17 NOTE — Assessment & Plan Note (Signed)
 Hyperlipidemia managed with atorvastatin . Recent cholesterol levels were not concerning. - Continue atorvastatin  20 mg daily

## 2023-12-17 NOTE — Patient Instructions (Signed)
  VISIT SUMMARY: You had a routine wellness visit today with no new concerns or acute issues. We discussed your current medications, ordered some blood work, and addressed your intermittent neck soreness.  YOUR PLAN: MEDICATION REFILLS: You are due for medication refills. -Refill medications at [facility].  BLOOD WORK: You need to have your hemoglobin levels rechecked. -Order limited blood work focusing on hemoglobin levels.  DIABETES MELLITUS, TYPE 2: Your diabetes is reasonably controlled with a recent A1c of 6.8%. -Continue taking metformin  500 mg, one tablet twice daily. -Schedule an annual eye exam.  HYPERTENSION: Your blood pressure is managed with carvedilol . -Continue taking carvedilol  6.25 mg once daily.  CARDIOMYOPATHY: Your cardiomyopathy is managed with carvedilol . -Continue taking carvedilol  6.25 mg once daily.  HYPERLIPIDEMIA: Your cholesterol levels are managed with atorvastatin . -Continue taking atorvastatin  20 mg daily.  ANEMIA: You have mild anemia with slightly decreasing hemoglobin levels. -Order blood work to recheck hemoglobin levels.  NECK PAIN: You have intermittent soreness on the left side of your neck, which improves with stretching exercises. -Continue regular neck stretching exercises. -Use Tylenol  as needed for pain relief.                      Contains text generated by Abridge.                                 Contains text generated by Abridge.

## 2023-12-17 NOTE — Assessment & Plan Note (Signed)
 Well controlled. A1C 6.8 as of last blood work. Will repeat today Complications of vascular disease which is well controlled at this time

## 2024-01-08 ENCOUNTER — Other Ambulatory Visit: Payer: Self-pay | Admitting: Family Medicine

## 2024-01-13 ENCOUNTER — Other Ambulatory Visit: Payer: Self-pay | Admitting: Family Medicine

## 2024-01-13 DIAGNOSIS — E1159 Type 2 diabetes mellitus with other circulatory complications: Secondary | ICD-10-CM

## 2024-02-19 ENCOUNTER — Ambulatory Visit (INDEPENDENT_AMBULATORY_CARE_PROVIDER_SITE_OTHER)

## 2024-02-19 VITALS — BP 100/60 | HR 68 | Temp 97.2°F | Resp 16 | Ht 67.0 in | Wt 200.0 lb

## 2024-02-19 DIAGNOSIS — M545 Low back pain, unspecified: Secondary | ICD-10-CM | POA: Insufficient documentation

## 2024-02-19 LAB — POCT URINALYSIS DIP (CLINITEK)
Blood, UA: NEGATIVE
Glucose, UA: NEGATIVE mg/dL
Nitrite, UA: NEGATIVE
POC PROTEIN,UA: 30 — AB
Spec Grav, UA: >=1.03 — AB (ref 1.010–1.025)
Urobilinogen, UA: 0.2 U/dL
pH, UA: 6 (ref 5.0–8.0)

## 2024-02-19 NOTE — Progress Notes (Signed)
 Acute Office Visit  Subjective:    Patient ID: Mark [name], male    DOB: Feb 28, 1946, 78 y.o.   MRN: [id number]  Chief Complaint  Patient presents with   Back Pain    HPI: Discussed the use of AI scribe software for clinical note transcription with the patient, who gave verbal consent to proceed.  History of Present Illness   Mark [name] is a 78 year old male who presents with low back pain.  Low back pain - Onset of soreness in the mid low back since yesterday morning - Pain is exacerbated by bending over - No radiation of pain to other areas - Tylenol  and ibuprofen  used for pain relief without significant effect - Ibuprofen  taken approximately one hour prior to visit - Tylenol  used since onset of pain  Constitutional and associated symptoms - No abdominal pain or lower abdominal pain - No urinary symptoms, including dysuria  Hydration and dietary habits - Habitual consumption of two 20-ounce cans of regular Dr. Nunzio daily since childhood - Rarely drinks water  Respiratory symptoms - Persistent runny nose since COVID-19 infection two years ago  Immunization status - Annual influenza and COVID-19 vaccinations received in the past       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   ED (erectile dysfunction) 11/27/2019   Essential hypertension    Floaters 05/02/2022   HFrEF (heart failure with reduced ejection fraction) (HCC) 03/16/2022   History of BPH    Hyperlipidemia 07/20/2017   LBBB (left bundle branch block) 12/27/2021   Osteoarthritis of right knee  12/08/2020   Peripheral vascular disease (HCC)    Type 2 diabetes mellitus with vascular disease (HCC)     Past Surgical History:  Procedure Laterality Date   HERNIA REPAIR     INGUINAL HERNIA REPAIR Bilateral    LEFT HEART CATH AND CORONARY ANGIOGRAPHY N/A 03/23/2022   Procedure: LEFT HEART CATH AND CORONARY ANGIOGRAPHY;  Surgeon: [place], Peter M, MD;  Location: [facility];  Service: Cardiovascular;  Laterality: N/A;    Family History  Problem Relation Age of Onset   Cancer Mother    Hypertension Neg Hx    Heart disease Neg Hx    Diabetes Neg Hx     Social History   Socioeconomic History   Marital status: Single    Spouse name: Not on file   Number of children: 4   Years of education: 12   Highest education level: 12th grade  Occupational History   Occupation: retired   Tobacco Use   Smoking status: Never   Smokeless tobacco: Never  Vaping Use   Vaping status: Never Used  Substance and Sexual Activity   Alcohol  use: No   Drug use: No   Sexual activity: Not Currently  Other Topics Concern   Not on file  Social History Narrative   Lives in a hotel and feels safe with his living arrangements.    Social Drivers of Corporate investment banker Strain: Low Risk  (09/13/2023)   Overall Financial Resource Strain (CARDIA)    Difficulty of Paying Living Expenses: Not very  hard  Food Insecurity: No Food Insecurity (09/13/2023)   Hunger Vital Sign    Worried About Running Out of Food in the Last Year: Never true    Ran Out of Food in the Last Year: Never true  Transportation Needs: No Transportation Needs (09/13/2023)   PRAPARE - Administrator, Civil Service (Medical): No    Lack of Transportation (Non-Medical): No  Physical Activity: Sufficiently Active (09/13/2023)   Exercise Vital Sign    Days of Exercise per Week: 4 days    Minutes of Exercise per Session: 60 min  Stress: No Stress Concern Present (10/12/2022)   [employer] of Occupational  Health - Occupational Stress Questionnaire    Feeling of Stress : Not at all  Social Connections: Moderately Isolated (09/13/2023)   Social Connection and Isolation Panel    Frequency of Communication with Friends and Family: More than three times a week    Frequency of Social Gatherings with Friends and Family: Twice a week    Attends Religious Services: 1 to 4 times per year    Active Member of [employer] or Organizations: No    Attends Banker Meetings: Never    Marital Status: Never married  Intimate Partner Violence: Not At Risk (09/13/2023)   Humiliation, Afraid, Rape, and Kick questionnaire    Fear of Current or Ex-Partner: No    Emotionally Abused: No    Physically Abused: No    Sexually Abused: No    Outpatient Medications Prior to Visit  Medication Sig Dispense Refill   albuterol  (VENTOLIN  HFA) 108 (90 Base) MCG/ACT inhaler Inhale 1-2 puffs into the lungs as needed for wheezing or shortness of breath.     Alcohol  Swabs  (DROPSAFE ALCOHOL  PREP) 70 % PADS 1 each by Does not apply route 2 (two) times daily. as directed 200 each 3   aspirin  81 MG chewable tablet Chew 81 mg by mouth daily.     atorvastatin  (LIPITOR) 20 MG tablet Take 1 tablet (20 mg total) by mouth daily. 90 tablet 2   Blood Glucose Calibration (TRUE METRIX LEVEL 1) Low SOLN 1 each by In Vitro route every 30 (thirty) days. 1 each 2   Blood Glucose Monitoring Suppl (TRUE METRIX AIR GLUCOSE METER) w/Device KIT 1 each by Does not apply route in the morning and at bedtime. 1 kit 0   carvedilol  (COREG ) 6.25 MG tablet TAKE 1 TABLET EVERY DAY (NEED MD APPOINTMENT) 90 tablet 1   fluticasone  (FLONASE ) 50 MCG/ACT nasal spray Place 2 sprays into both nostrils daily. 16 g 6   glucose blood (TRUE METRIX BLOOD GLUCOSE TEST) test strip TEST BLOOD SUGAR IN THE MORNING AND AT BEDTIME AS INSTRUCTED 200 strip 3   ipratropium (ATROVENT  HFA) 17 MCG/ACT inhaler Inhale 2 puffs into the lungs every 4 (four) hours as needed for  wheezing. 1 each 12   Magnesium 500 MG TABS Take 500 mg by mouth in the morning.     metFORMIN  (GLUCOPHAGE ) 500 MG tablet TAKE 1/2 TABLET TWICE DAILY (NEED MD APPOINTMENT) 90 tablet 1   methocarbamol  (ROBAXIN ) 500 MG tablet Take 500 mg by mouth in the morning and at bedtime.     nitroGLYCERIN  (NITROSTAT ) 0.4 MG SL tablet DISSOLVE ONE TABLET UNDER TONGUE AS NEEDED FOR CHEST PAIN UP TO 3 TABLETS 25 tablet 11   spironolactone  (ALDACTONE ) 25 MG tablet TAKE 1/2 TABLET EVERY DAY 45 tablet 1   TRUEplus Lancets 30G MISC TEST BLOOD SUGAR IN THE MORNING AND AT  BEDTIME. 200 each 3   pantoprazole  (PROTONIX ) 40 MG tablet Take 40 mg by mouth daily.     No facility-administered medications prior to visit.    Allergies  Allergen Reactions   Penicillins Rash    Can take by mouth, can not take injections will break out in a rash    Review of Systems  Constitutional:  Negative for chills, fatigue, fever and unexpected weight change.  HENT:  Negative for congestion, ear pain, sinus pain and sore throat.   Respiratory:  Negative for cough and shortness of breath.   Cardiovascular:  Negative for chest pain and palpitations.  Gastrointestinal:  Negative for abdominal pain, blood in stool, constipation, diarrhea, nausea and vomiting.  Endocrine: Negative for polydipsia.  Genitourinary:  Negative for dysuria.  Musculoskeletal:  Positive for back pain.  Skin:  Negative for rash.  Neurological:  Negative for headaches.       Objective:        02/19/2024    9:47 AM 12/17/2023    7:29 AM 12/06/2023    7:22 AM  Vitals with BMI  Height 5' 7 5' 7 5' 7  Weight 200 lbs 199 lbs 3 oz 195 lbs  BMI 31.32 31.19 30.53  Systolic 100 122 897  Diastolic 60 78 72  Pulse 68 60 69    No data found.   Physical Exam Vitals and nursing note reviewed.  Constitutional:      Appearance: He is obese.  HENT:     Head: Normocephalic.  Cardiovascular:     Rate and Rhythm: Normal rate and regular rhythm.  Abdominal:      Tenderness: There is no right CVA tenderness or left CVA tenderness.  Musculoskeletal:        General: Tenderness (minimal mid low back tenderness) present.  Neurological:     Mental Status: He is alert.     Health Maintenance Due  Topic Date Due   COVID-19 Vaccine (5 - 2024-25 season) 04/15/2023   FOOT EXAM  05/03/2023   OPHTHALMOLOGY EXAM  05/04/2023   Medicare Annual Wellness (AWV)  10/12/2023   Diabetic kidney evaluation - Urine ACR  11/01/2023    There are no preventive care reminders to display for this patient.   No results found for: TSH Lab Results  Component Value Date   WBC 3.7 12/17/2023   HGB 13.6 12/17/2023   HCT 40.6 12/17/2023   MCV 94 12/17/2023   PLT 310 12/17/2023   Lab Results  Component Value Date   NA 141 12/03/2023   K 3.9 12/03/2023   CO2 27 12/03/2023   GLUCOSE 138 (H) 12/03/2023   BUN 16 12/03/2023   CREATININE 1.22 12/03/2023   BILITOT 0.5 12/03/2023   ALKPHOS 69 12/03/2023   AST 11 (L) 12/03/2023   ALT 7 12/03/2023   PROT 6.6 12/03/2023   ALBUMIN 3.8 12/03/2023   CALCIUM  9.5 12/03/2023   ANIONGAP 7 12/03/2023   EGFR 66 09/17/2023   Lab Results  Component Value Date   CHOL 157 09/17/2023   Lab Results  Component Value Date   HDL 36 (L) 09/17/2023   Lab Results  Component Value Date   LDLCALC 96 09/17/2023   Lab Results  Component Value Date   TRIG 141 09/17/2023   Lab Results  Component Value Date   CHOLHDL 4.4 09/17/2023   Lab Results  Component Value Date   HGBA1C 6.4 (H) 12/17/2023       Assessment & Plan:  Acute bilateral low  back pain without sciatica Assessment & Plan: Acute onset of low back pain since yesterday morning, located in the mid-low back and exacerbated by bending. No abdominal pain, lower abdominal pain, or dysuria. Physical examination did not reproduce pain upon palpation.   Differential diagnosis includes musculoskeletal strain, kidney stone, or urinary tract infection. Urinalysis  showed cloudy urine with white blood cells but no blood, suggesting possible urinary tract infection or dehydration. However, a urinary tract infection or kidney stone is not strongly suspected at this time. - Send urine sample for culture to rule out urinary tract infection - Advise to stop taking ibuprofen  and continue with acetaminophen  for pain management - Encourage increased water intake and cessation of Dr. Nunzio consumption - Advise to monitor urine color and seek emergency care if pain intensifies to 10/10 or if there is hematuria - Recommend using a heating pad on the low back for pain relief    hemoglobin A1c of 6.8% as of April.  General Health Maintenance Discussion on lifestyle modifications, specifically reducing caloric intake by eliminating Dr. Nunzio consumption. He consumes two 20-ounce Dr. Nunzio sodas daily, contributing to an excess caloric intake of approximately 480 calories per day. - Advise to stop drinking Dr. Nunzio to reduce caloric intake by approximately 480 calories per day  Follow-up He has a follow-up appointment scheduled for July 17th for a wellness visit and another appointment in November. - Attend wellness visit on July 17th - Attend follow-up appointment in November   Orders: -     POCT URINALYSIS DIP (CLINITEK) -     Urine Culture   Assessment and Plan            No orders of the defined types were placed in this encounter.   Orders Placed This Encounter  Procedures   Urine Culture   POCT URINALYSIS DIP (CLINITEK)     Follow-up: No follow-ups on file.  An After Visit Summary was printed and given to the patient.  Damia Bobrowski, MD [facility] [phone]

## 2024-02-19 NOTE — Patient Instructions (Signed)
  VISIT SUMMARY: Today, you were seen for acute low back pain that started yesterday. We discussed your symptoms, possible causes, and steps to manage the pain. We also reviewed your diabetes management and general health maintenance.  YOUR PLAN: LOW BACK PAIN: You have acute low back pain that started yesterday and is worse when you bend over. There is no pain radiating to other areas, and no abdominal or urinary symptoms. -We sent a urine sample for culture to rule out a urinary tract infection. -Stop taking ibuprofen  and continue with acetaminophen  for pain management. -Increase your water intake and stop drinking Dr. Nunzio. -Monitor your urine color and seek emergency care if the pain becomes severe (10/10) or if you notice blood in your urine. -Use a heating pad on your low back for pain relief.  DIABETES MELLITUS: Your diabetes is well-managed with a hemoglobin A1c of 6.8% as of April. -Continue with your current diabetes management plan.  GENERAL HEALTH MAINTENANCE: We discussed lifestyle changes to improve your overall health, particularly focusing on your high intake of Dr. Nunzio. -Stop drinking Dr. Nunzio to reduce your daily caloric intake by approximately 480 calories.  FOLLOW-UP: You have upcoming appointments to monitor your health. -Attend your wellness visit on July 17th. -Attend your follow-up appointment in November.                      Contains text generated by Abridge.                                 Contains text generated by Abridge.

## 2024-02-19 NOTE — Assessment & Plan Note (Signed)
 Acute onset of low back pain since yesterday morning, located in the mid-low back and exacerbated by bending. No abdominal pain, lower abdominal pain, or dysuria. Physical examination did not reproduce pain upon palpation.   Differential diagnosis includes musculoskeletal strain, kidney stone, or urinary tract infection. Urinalysis showed cloudy urine with white blood cells but no blood, suggesting possible urinary tract infection or dehydration. However, a urinary tract infection or kidney stone is not strongly suspected at this time. - Send urine sample for culture to rule out urinary tract infection - Advise to stop taking ibuprofen  and continue with acetaminophen  for pain management - Encourage increased water intake and cessation of Dr. Nunzio consumption - Advise to monitor urine color and seek emergency care if pain intensifies to 10/10 or if there is hematuria - Recommend using a heating pad on the low back for pain relief    hemoglobin A1c of 6.8% as of April.  General Health Maintenance Discussion on lifestyle modifications, specifically reducing caloric intake by eliminating Dr. Nunzio consumption. He consumes two 20-ounce Dr. Nunzio sodas daily, contributing to an excess caloric intake of approximately 480 calories per day. - Advise to stop drinking Dr. Nunzio to reduce caloric intake by approximately 480 calories per day  Follow-up He has a follow-up appointment scheduled for July 17th for a wellness visit and another appointment in November. - Attend wellness visit on July 17th - Attend follow-up appointment in November

## 2024-02-21 ENCOUNTER — Ambulatory Visit: Payer: Self-pay

## 2024-02-21 ENCOUNTER — Telehealth: Payer: Self-pay

## 2024-02-21 LAB — URINE CULTURE

## 2024-02-21 NOTE — Telephone Encounter (Unsigned)
 Copied from CRM [id number]. Topic: Clinical - Medication Refill >> Feb 21, 2024  8:06 AM Fonda T wrote: Medication: furosemide (LASIX) 20 MG tablet  Has the patient contacted their pharmacy? Yes, Alfonso from [facility], patient will need 90 day supply requesting medication refill (Agent: If no, request that the patient contact the pharmacy for the refill. If patient does not wish to contact the pharmacy document the reason why and proceed with request.) (Agent: If yes, when and what did the pharmacy advise?)  This is the patient's preferred pharmacy:  [facility] - [place], [place] - [address] [place] [address] Phone: [phone] Fax: [phone]   Is this the correct pharmacy for this prescription? Yes If no, delete pharmacy and type the correct one.   Has the prescription been filled recently? Yes  Is the patient out of the medication? Yes  Has the patient been seen for an appointment in the last year OR does the patient have an upcoming appointment? Yes  Can we respond through MyChart? No  Agent: Please be advised that Rx refills may take up to 3 business days. We ask that you follow-up with your pharmacy.

## 2024-02-21 NOTE — Telephone Encounter (Signed)
 Not on current med list, last prescribed 10/12/22

## 2024-02-28 ENCOUNTER — Ambulatory Visit

## 2024-02-28 VITALS — Ht 67.0 in | Wt 200.0 lb

## 2024-02-28 DIAGNOSIS — Z0000 Encounter for general adult medical examination without abnormal findings: Secondary | ICD-10-CM

## 2024-02-28 NOTE — Patient Instructions (Signed)
 Mr. Pulsifer , Thank you for taking time out of your busy schedule to complete your Annual Wellness Visit with me. I enjoyed our conversation and look forward to speaking with you again next year. I, as well as your care team,  appreciate your ongoing commitment to your health goals. Please review the following plan we discussed and let me know if I can assist you in the future. Your Game plan/ To Do List     Follow up Visits: Next Medicare AWV with our clinical staff: In 1 year    Have you seen your provider in the last 6 months (3 months if uncontrolled diabetes)? Yes Next Office Visit with your provider: 03/03/24 @ 8:20  Clinician Recommendations:  Aim for 30 minutes of exercise or brisk walking, 6-8 glasses of water, and 5 servings of fruits and vegetables each day.       This is a list of the screening recommended for you and due dates:  Health Maintenance  Topic Date Due   COVID-19 Vaccine (5 - 2024-25 season) 04/15/2023   Complete foot exam   05/03/2023   Eye exam for diabetics  05/04/2023   Yearly kidney health urinalysis for diabetes  11/01/2023   Zoster (Shingles) Vaccine (1 of 2) 03/06/2024*   DTaP/Tdap/Td vaccine (1 - Tdap) 12/05/2024*   Flu Shot  03/14/2024   Hemoglobin A1C  06/18/2024   Yearly kidney function blood test for diabetes  12/02/2024   Medicare Annual Wellness Visit  02/27/2025   Pneumococcal Vaccine for age over 34  Completed   Hepatitis C Screening  Completed   Hepatitis B Vaccine  Aged Out   HPV Vaccine  Aged Out   Meningitis B Vaccine  Aged Out   Colon Cancer Screening  Discontinued  *Topic was postponed. The date shown is not the original due date.    Advanced directives: (ACP Link)Information on Advanced Care Planning can be found at [place]  Secretary of [facility] Advance Health Care Directives Advance Health Care Directives. [email or web address]   Advance Care Planning is important because it:  [x]  Makes sure you receive the medical care that is consistent  with your values, goals, and preferences  [x]  It provides guidance to your family and loved ones and reduces their decisional burden about whether or not they are making the right decisions based on your wishes.  Follow the link provided in your after visit summary or read over the paperwork we have mailed to you to help you started getting your Advance Directives in place. If you need assistance in completing these, please reach out to us  so that we can help you!  See attachments for Preventive Care and Fall Prevention Tips.

## 2024-02-28 NOTE — Progress Notes (Signed)
 Subjective:   Mark [name] is a 78 y.o. who presents for a Medicare Wellness preventive visit.  As a reminder, Annual Wellness Visits don't include a physical exam, and some assessments may be limited, especially if this visit is performed virtually. We may recommend an in-[place] follow-up visit with your provider if needed.  Visit Complete: Virtual I connected with  Mark [name] on 02/28/24 by a audio enabled telemedicine application and verified that I am speaking with the correct Mark [name] using two identifiers.  Patient Location: Home  Provider Location: Home Office  I discussed the limitations of evaluation and management by telemedicine. The patient expressed understanding and agreed to proceed.  Vital Signs: Because this visit was a virtual/telehealth visit, some criteria may be missing or patient reported. Any vitals not documented were not able to be obtained and vitals that have been documented are patient reported.  VideoDeclined- This patient declined Librarian, academic. Therefore the visit was completed with audio only.  Persons Participating in Visit: Patient.  AWV Questionnaire: No: Patient Medicare AWV questionnaire was not completed prior to this visit.  Cardiac Risk Factors include: advanced age (>13men, >12 women);diabetes mellitus;dyslipidemia;hypertension;male gender     Objective:    Today's Vitals   02/28/24 0823  Weight: 200 lb (90.7 kg)  Height: 5' 7 (1.702 m)   Body mass index is 31.32 kg/m.     02/28/2024    8:26 AM 09/02/2023    1:41 PM 10/12/2022    8:40 AM 03/23/2022    7:44 AM 01/13/2022    9:29 AM 07/20/2020    2:10 PM 04/18/2020   12:33 PM  Advanced Directives  Does Patient Have a Medical Advance Directive? No No No No No No No  Would patient like information on creating a medical advance directive? Yes (MAU/Ambulatory/Procedural Areas - Information given) No - Patient declined Yes (ED - Information included  in AVS) No - Patient declined No - Patient declined      Current Medications (verified) Outpatient Encounter Medications as of 02/28/2024  Medication Sig   albuterol  (VENTOLIN  HFA) 108 (90 Base) MCG/ACT inhaler Inhale 1-2 puffs into the lungs as needed for wheezing or shortness of breath.   Alcohol  Swabs  (DROPSAFE ALCOHOL  PREP) 70 % PADS 1 each by Does not apply route 2 (two) times daily. as directed   aspirin  81 MG chewable tablet Chew 81 mg by mouth daily.   atorvastatin  (LIPITOR) 20 MG tablet Take 1 tablet (20 mg total) by mouth daily.   Blood Glucose Calibration (TRUE METRIX LEVEL 1) Low SOLN 1 each by In Vitro route every 30 (thirty) days.   Blood Glucose Monitoring Suppl (TRUE METRIX AIR GLUCOSE METER) w/Device KIT 1 each by Does not apply route in the morning and at bedtime.   carvedilol  (COREG ) 6.25 MG tablet TAKE 1 TABLET EVERY DAY (NEED MD APPOINTMENT)   fluticasone  (FLONASE ) 50 MCG/ACT nasal spray Place 2 sprays into both nostrils daily.   glucose blood (TRUE METRIX BLOOD GLUCOSE TEST) test strip TEST BLOOD SUGAR IN THE MORNING AND AT BEDTIME AS INSTRUCTED   ipratropium (ATROVENT  HFA) 17 MCG/ACT inhaler Inhale 2 puffs into the lungs every 4 (four) hours as needed for wheezing.   Magnesium 500 MG TABS Take 500 mg by mouth in the morning.   metFORMIN  (GLUCOPHAGE ) 500 MG tablet TAKE 1/2 TABLET TWICE DAILY (NEED MD APPOINTMENT)   methocarbamol  (ROBAXIN ) 500 MG tablet Take 500 mg by mouth in the morning and at bedtime.  nitroGLYCERIN  (NITROSTAT ) 0.4 MG SL tablet DISSOLVE ONE TABLET UNDER TONGUE AS NEEDED FOR CHEST PAIN UP TO 3 TABLETS   spironolactone  (ALDACTONE ) 25 MG tablet TAKE 1/2 TABLET EVERY DAY   TRUEplus Lancets 30G MISC TEST BLOOD SUGAR IN THE MORNING AND AT BEDTIME.   No facility-administered encounter medications on file as of 02/28/2024.    Allergies (verified) Penicillins   History: Past Medical History:  Diagnosis Date   Acute renal failure superimposed on stage 3a  chronic kidney disease (HCC) 12/08/2020   Arthritis    Atherosclerotic heart disease of native coronary artery with angina pectoris (HCC) 07/20/2017   BMI 31.0-31.9,adult 03/29/2020   BMI 32.0-32.9,adult 03/29/2020   Cardiac murmur 03/02/2022   Cardiomyopathy (HCC) 03/16/2022   Cervical spondylosis 01/11/2022   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   ED (erectile dysfunction) 11/27/2019   Essential hypertension    Floaters 05/02/2022   HFrEF (heart failure with reduced ejection fraction) (HCC) 03/16/2022   History of BPH    Hyperlipidemia 07/20/2017   LBBB (left bundle branch block) 12/27/2021   Osteoarthritis of right knee 12/08/2020   Peripheral vascular disease (HCC)    Type 2 diabetes mellitus with vascular disease (HCC)    Past Surgical History:  Procedure Laterality Date   HERNIA REPAIR     INGUINAL HERNIA REPAIR Bilateral    LEFT HEART CATH AND CORONARY ANGIOGRAPHY N/A 03/23/2022   Procedure: LEFT HEART CATH AND CORONARY ANGIOGRAPHY;  Surgeon: [place], Peter M, MD;  Location: [facility];  Service: Cardiovascular;  Laterality: N/A;   Family History  Problem Relation Age of Onset   Cancer Mother    Hypertension Neg Hx    Heart disease Neg Hx    Diabetes Neg Hx    Social History   Socioeconomic History   Marital status: Single    Spouse name: Not on file   Number of children: 4   Years of education: 12   Highest education level: 12th grade  Occupational History   Occupation: retired   Tobacco Use   Smoking status: Never   Smokeless tobacco: Never  Vaping Use   Vaping status: Never Used  Substance and Sexual Activity   Alcohol  use: No   Drug use: No   Sexual activity: Not Currently  Other Topics Concern   Not on file  Social History Narrative   Lives in a hotel and feels safe with his living arrangements.    Social Drivers of Corporate investment banker Strain: Low Risk  (02/28/2024)   Overall Financial Resource Strain (CARDIA)    Difficulty of  Paying Living Expenses: Not very hard  Food Insecurity: No Food Insecurity (02/28/2024)   Hunger Vital Sign    Worried About Running Out of Food in the Last Year: Never true    Ran Out of Food in the Last Year: Never true  Transportation Needs: No Transportation Needs (02/28/2024)   PRAPARE - Administrator, Civil Service (Medical): No    Lack of Transportation (Non-Medical): No  Physical Activity: Sufficiently Active (02/28/2024)   Exercise Vital Sign    Days of Exercise per Week: 5 days    Minutes of Exercise per Session: 30 min  Stress: No Stress Concern Present (02/28/2024)   [employer] of Occupational Health - Occupational Stress Questionnaire    Feeling of Stress: Not at all  Social Connections: Moderately Isolated (02/28/2024)   Social Connection and Isolation Panel    Frequency of Communication with Friends and Family:  More than three times a week    Frequency of Social Gatherings with Friends and Family: Twice a week    Attends Religious Services: 1 to 4 times per year    Active Member of [employer] or Organizations: No    Attends Engineer, structural: Never    Marital Status: Never married    Tobacco Counseling Counseling given: Not Answered    Clinical Intake:  Pre-visit preparation completed: Yes  Pain : No/denies pain     Diabetes: Yes CBG done?: No Did pt. bring in CBG monitor from home?: No  Lab Results  Component Value Date   HGBA1C 6.4 (H) 12/17/2023   HGBA1C 6.8 (H) 09/17/2023   HGBA1C 6.5 (H) 03/09/2023     How often do you need to have someone help you when you read instructions, pamphlets, or other written materials from your doctor or pharmacy?: 1 - Never  Interpreter Needed?: No  Information entered by :: Charmaine Bloodgood LPN   Activities of Daily Living     02/28/2024    8:23 AM  In your present state of health, do you have any difficulty performing the following activities:  Hearing? 0  Vision? 0  Difficulty  concentrating or making decisions? 0  Walking or climbing stairs? 0  Dressing or bathing? 0  Doing errands, shopping? 0  Preparing Food and eating ? N  Using the Toilet? N  In the past six months, have you accidently leaked urine? N  Do you have problems with loss of bowel control? N  Managing your Medications? N  Managing your Finances? N  Housekeeping or managing your Housekeeping? N    Patient Care Team: Sirivol, Mamatha, MD as PCP - General (Family Medicine)  I have updated your Care Teams any recent Medical Services you may have received from other providers in the past year.     Assessment:   This is a routine wellness examination for Hecker.  Hearing/Vision screen Hearing Screening - Comments:: Denies hearing difficulties   Vision Screening - Comments:: No vision problems; will schedule routine eye exam soon      Goals Addressed             This Visit's Progress    Maintain health and independence   On track      Depression Screen     02/28/2024    8:25 AM 12/06/2023    7:28 AM 09/13/2023    7:45 AM 03/09/2023    8:01 AM 10/12/2022    8:43 AM 09/05/2022    8:22 AM 12/27/2021    7:48 AM  PHQ 2/9 Scores  PHQ - 2 Score 0 0 0 2 0 0 0  PHQ- 9 Score    5       Fall Risk     02/28/2024    8:26 AM 12/06/2023    7:28 AM 03/09/2023    8:01 AM 10/12/2022    8:42 AM 09/05/2022    8:22 AM  Fall Risk   Falls in the past year? 0 0 0 0 0  Number falls in past yr: 0 0 0 0 0  Injury with Fall? 0 0 0 0 0  Risk for fall due to : No Fall Risks No Fall Risks No Fall Risks No Fall Risks No Fall Risks  Follow up Falls prevention discussed;Education provided;Falls evaluation completed  Falls evaluation completed;Falls prevention discussed  Falls evaluation completed      Data saved with a previous flowsheet row definition  MEDICARE RISK AT HOME:  Medicare Risk at Home Any stairs in or around the home?: No If so, are there any without handrails?: No Home free of loose  throw rugs in walkways, pet beds, electrical cords, etc?: Yes Adequate lighting in your home to reduce risk of falls?: Yes Life alert?: No Use of a cane, walker or w/c?: No Grab bars in the bathroom?: Yes Shower chair or bench in shower?: No Elevated toilet seat or a handicapped toilet?: Yes  TIMED UP AND GO:  Was the test performed?  No  Cognitive Function: 6CIT completed        02/28/2024    8:27 AM 10/12/2022    8:46 AM 07/21/2021    7:51 AM 07/20/2020    2:14 PM  6CIT Screen  What Year? 0 points 0 points 0 points 0 points  What month? 0 points 0 points 0 points 0 points  What time? 0 points 0 points 0 points 0 points  Count back from 20 0 points 0 points 0 points 0 points  Months in reverse 2 points 2 points 0 points 0 points  Repeat phrase 2 points 2 points 0 points 0 points  Total Score 4 points 4 points 0 points 0 points    Immunizations Immunization History  Administered Date(s) Administered   Fluad Quad(high Dose 65+) 07/20/2020, 05/19/2021, 05/02/2022   Influenza-Unspecified 10/30/2016, 06/14/2018, 07/23/2019   PFIZER(Purple Top)SARS-COV-2 Vaccination 04/02/2020, 04/23/2020   PPD Test 12/10/2018, 02/28/2021, 01/23/2023   Pfizer Covid-19 Vaccine Bivalent Booster 5yrs & up 05/19/2021   Pfizer(Comirnaty)Fall Seasonal Vaccine 12 years and older 11/01/2022   Pneumococcal Conjugate-13 10/30/2016   Pneumococcal Polysaccharide-23 07/05/2018    Screening Tests Health Maintenance  Topic Date Due   COVID-19 Vaccine (5 - 2024-25 season) 04/15/2023   FOOT EXAM  05/03/2023   OPHTHALMOLOGY EXAM  05/04/2023   Diabetic kidney evaluation - Urine ACR  11/01/2023   Zoster Vaccines- Shingrix (1 of 2) 03/06/2024 (Originally 11/14/1964)   DTaP/Tdap/Td (1 - Tdap) 12/05/2024 (Originally 11/14/1964)   INFLUENZA VACCINE  03/14/2024   HEMOGLOBIN A1C  06/18/2024   Diabetic kidney evaluation - eGFR measurement  12/02/2024   Medicare Annual Wellness (AWV)  02/27/2025   Pneumococcal  Vaccine: 50+ Years  Completed   Hepatitis C Screening  Completed   Hepatitis B Vaccines  Aged Out   HPV VACCINES  Aged Out   Meningococcal B Vaccine  Aged Out   Colonoscopy  Discontinued    Health Maintenance  Health Maintenance Due  Topic Date Due   COVID-19 Vaccine (5 - 2024-25 season) 04/15/2023   FOOT EXAM  05/03/2023   OPHTHALMOLOGY EXAM  05/04/2023   Diabetic kidney evaluation - Urine ACR  11/01/2023    Additional Screening:  Vision Screening: Recommended annual ophthalmology exams for early detection of glaucoma and other disorders of the eye. Would you like a referral to an eye doctor? No    Dental Screening: Recommended annual dental exams for proper oral hygiene  Community Resource Referral / Chronic Care Management: CRR required this visit?  No   CCM required this visit?  No   Plan:    I have personally reviewed and noted the following in the patient's chart:   Medical and social history Use of alcohol , tobacco or illicit drugs  Current medications and supplements including opioid prescriptions. Patient is not currently taking opioid prescriptions. Functional ability and status Nutritional status Physical activity Advanced directives List of other physicians Hospitalizations, surgeries, and ER visits in previous 12  months Vitals Screenings to include cognitive, depression, and falls Referrals and appointments  In addition, I have reviewed and discussed with patient certain preventive protocols, quality metrics, and best practice recommendations. A written personalized care plan for preventive services as well as general preventive health recommendations were provided to patient.   Lavelle Pfeiffer [place], [place]   [id number]   After Visit Summary: (Pick Up) Due to this being a telephonic visit, with patients personalized plan was offered to patient and patient has requested to Pick up at office.  Notes: PCP Follow Up Recommendations: Patient is  complaining of lower back pain and is requesting pain medication; appointment scheduled to discuss

## 2024-03-03 ENCOUNTER — Ambulatory Visit (INDEPENDENT_AMBULATORY_CARE_PROVIDER_SITE_OTHER)

## 2024-03-03 VITALS — BP 130/74 | HR 75 | Temp 98.1°F | Ht 67.0 in | Wt 194.0 lb

## 2024-03-03 DIAGNOSIS — M545 Low back pain, unspecified: Secondary | ICD-10-CM | POA: Diagnosis not present

## 2024-03-03 DIAGNOSIS — E782 Mixed hyperlipidemia: Secondary | ICD-10-CM

## 2024-03-03 DIAGNOSIS — I10 Essential (primary) hypertension: Secondary | ICD-10-CM | POA: Diagnosis not present

## 2024-03-03 MED ORDER — SPIRONOLACTONE 25 MG PO TABS: 12.5000 mg | ORAL_TABLET | Freq: Every day | ORAL | 1 refills | Status: AC

## 2024-03-03 MED ORDER — CARVEDILOL 6.25 MG PO TABS: 6.2500 mg | ORAL_TABLET | Freq: Every day | ORAL | 1 refills | Status: DC

## 2024-03-03 MED ORDER — ATORVASTATIN CALCIUM 20 MG PO TABS: 20.0000 mg | ORAL_TABLET | Freq: Every day | ORAL | 1 refills | Status: AC

## 2024-03-03 MED ORDER — METHOCARBAMOL 750 MG PO TABS: 750.0000 mg | ORAL_TABLET | Freq: Two times a day (BID) | ORAL | 0 refills | Status: AC | PRN

## 2024-03-03 NOTE — Assessment & Plan Note (Signed)
 Hyperlipidemia managed with atorvastatin . Recent cholesterol levels were not concerning. - Continue atorvastatin  20 mg daily - refill sent

## 2024-03-03 NOTE — Patient Instructions (Signed)
  VISIT SUMMARY: You visited us  today for persistent low back pain that has been ongoing for 13 days. We discussed your current pain management strategies and made some adjustments to your medication. We also reviewed your arthritis, hypertension, and hyperlipidemia management plans.  YOUR PLAN: LOW BACK PAIN: You have been experiencing low back pain for 13 days, which is likely musculoskeletal in origin. -Prescribe Robaxin  (muscle relaxant) 30 tablets, to be taken as needed. -Continue using Tylenol , heat, and ice for pain management. -Encourage stretching before and after exercise. -Monitor for any changes such as pain radiating to legs, tingling, numbness, or weakness, which would require further imaging.  ARTHRITIS: You have arthritis in your right knee, which may also be contributing to your low back pain. -Continue current management with Tylenol  and exercise. -Monitor symptoms and consider imaging if pain worsens or changes.  HYPERTENSION: Your hypertension is being managed with spironolactone  and carvedilol . -Continue taking spironolactone  half a pill daily. -Continue taking carvedilol  6.25 mg once daily. -Refill medications through mail order pharmacy.  HYPERLIPIDEMIA: Your hyperlipidemia is being managed with atorvastatin . -Continue taking atorvastatin  20 mg daily. -Refill medication through mail order pharmacy.                      Contains text generated by [employer]bridge.                                 Contains text generated by Abridge.

## 2024-03-03 NOTE — Assessment & Plan Note (Signed)
 Hypertension managed with carvedilol . He prefers current regimen of 6.25 mg once daily despite recommendation for twice daily dosing. Discussed that the medication is not a 24-hour pill and typically requires twice daily dosing, but he prefers once daily to avoid missing doses. - Continue carvedilol  6.25 mg once daily - refilled the medicine

## 2024-03-03 NOTE — Assessment & Plan Note (Signed)
 Chronic low back pain persisting for few weeks, localized to the low back without radiation to the legs, suggesting musculoskeletal origin rather than nerve involvement.    Previous urine culture was negative for infection. Pain is mild to moderate and does not suggest severe arthritis or pinched nerve. He prefers current management strategy over additional imaging at this time. - Prescribe Robaxin  (muscle relaxant) 750 mg, 30 tablets, to be taken couple times daily as needed. - Advise continuation of current regimen: Tylenol , heat or ice application. - Encourage stretching before and after exercise. - Monitor for any changes such as pain radiating to legs, tingling, numbness, or weakness, which would warrant further imaging like x-rays or MRI.

## 2024-03-03 NOTE — Progress Notes (Signed)
 Subjective:  Patient ID: Mark [name], male    DOB: 05-18-1946  Age: 78 y.o. MRN: [id number]  Chief Complaint  Patient presents with   Medical Management of Chronic Issues    HPI:  Discussed the use of AI scribe software for clinical note transcription with the patient, who gave verbal consent to proceed.  History of Present Illness   Mark [name] is a 78 year old male who presents with low back pain.  Low back pain - Persistent low back pain for at least 13 days since last visit on February 19, 2024 - Pain localized to the low back, midline - No radiation to legs or sides - No tingling, numbness, or weakness in legs - No recent x-rays of the low back - Previous concerns for urinary tract infection ruled out with negative urine cultures  Pain management and functional status - Did not receive prescribed muscle relaxant due to communication issues - Managing pain with Tylenol , heat, and ice - Regular exercise, including walking five miles daily and swimming, provides temporary relief  Arthralgia and arthritis - History of arthritis, particularly in the right knee  Medication adherence and side effects - Currently taking spironolactone , half a pill daily - Taking carvedilol  6.25 mg once daily, although prescribed for twice daily use - Taking atorvastatin  20 mg daily for cholesterol management - Medication regimen managed through mail order pharmacy, except for muscle relaxant which he prefers to pick up locally  Lower extremity edema - No current swelling in legs as previously experienced         02/28/2024    8:25 AM 12/06/2023    7:28 AM 09/13/2023    7:45 AM 03/09/2023    8:01 AM 10/12/2022    8:43 AM  Depression screen PHQ 2/9  Decreased Interest 0 0 0 2 0  Down, Depressed, Hopeless 0 0 0 0 0  PHQ - 2 Score 0 0 0 2 0  Altered sleeping    3   Tired, decreased energy    0   Change in appetite    0   Feeling bad or failure about yourself     0   Trouble  concentrating    0   Moving slowly or fidgety/restless    0   Suicidal thoughts    0   PHQ-9 Score    5   Difficult doing work/chores    Not difficult at all         02/28/2024    8:26 AM  Fall Risk   Falls in the past year? 0  Number falls in past yr: 0  Injury with Fall? 0  Risk for fall due to : No Fall Risks  Follow up Falls prevention discussed;Education provided;Falls evaluation completed    Patient Care Team: England Greb, MD as PCP - General (Family Medicine)   Review of Systems  Constitutional: Negative.   HENT: Negative.    Respiratory: Negative.    Cardiovascular: Negative.   Gastrointestinal: Negative.   Genitourinary: Negative.   Musculoskeletal:  Positive for back pain.  Neurological: Negative.   Psychiatric/Behavioral: Negative.    All other systems reviewed and are negative.   Current Outpatient Medications on File Prior to Visit  Medication Sig Dispense Refill   albuterol  (VENTOLIN  HFA) 108 (90 Base) MCG/ACT inhaler Inhale 1-2 puffs into the lungs as needed for wheezing or shortness of breath.     Alcohol  Swabs  (DROPSAFE ALCOHOL  PREP) 70 % PADS 1 each by  Does not apply route 2 (two) times daily. as directed 200 each 3   aspirin  81 MG chewable tablet Chew 81 mg by mouth daily.     Blood Glucose Calibration (TRUE METRIX LEVEL 1) Low SOLN 1 each by In Vitro route every 30 (thirty) days. 1 each 2   Blood Glucose Monitoring Suppl (TRUE METRIX AIR GLUCOSE METER) w/Device KIT 1 each by Does not apply route in the morning and at bedtime. 1 kit 0   fluticasone  (FLONASE ) 50 MCG/ACT nasal spray Place 2 sprays into both nostrils daily. 16 g 6   glucose blood (TRUE METRIX BLOOD GLUCOSE TEST) test strip TEST BLOOD SUGAR IN THE MORNING AND AT BEDTIME AS INSTRUCTED 200 strip 3   ipratropium (ATROVENT  HFA) 17 MCG/ACT inhaler Inhale 2 puffs into the lungs every 4 (four) hours as needed for wheezing. 1 each 12   Magnesium 500 MG TABS Take 500 mg by mouth in the morning.      metFORMIN  (GLUCOPHAGE ) 500 MG tablet TAKE 1/2 TABLET TWICE DAILY (NEED MD APPOINTMENT) 90 tablet 1   nitroGLYCERIN  (NITROSTAT ) 0.4 MG SL tablet DISSOLVE ONE TABLET UNDER TONGUE AS NEEDED FOR CHEST PAIN UP TO 3 TABLETS 25 tablet 11   TRUEplus Lancets 30G MISC TEST BLOOD SUGAR IN THE MORNING AND AT BEDTIME. 200 each 3   No current facility-administered medications on file prior to visit.   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   ED (erectile dysfunction) 11/27/2019   Essential hypertension    Floaters 05/02/2022   HFrEF (heart failure with reduced ejection fraction) (HCC) 03/16/2022   History of BPH    Hyperlipidemia 07/20/2017   LBBB (left bundle branch block) 12/27/2021   Osteoarthritis of right knee 12/08/2020   Peripheral vascular disease (HCC)    Type 2 diabetes mellitus with vascular disease (HCC)    Past Surgical History:  Procedure Laterality Date   HERNIA REPAIR     INGUINAL HERNIA REPAIR Bilateral    LEFT HEART CATH AND CORONARY ANGIOGRAPHY N/A 03/23/2022   Procedure: LEFT HEART CATH AND CORONARY ANGIOGRAPHY;  Surgeon: [place], Peter M, MD;  Location: [facility];  Service: Cardiovascular;  Laterality: N/A;    Family History  Problem Relation Age of Onset   Cancer Mother    Hypertension Neg Hx    Heart disease Neg Hx    Diabetes Neg Hx    Social History   Socioeconomic History   Marital status: Single    Spouse name: Not on file   Number of children: 4   Years of education: 12   Highest education level: 12th grade  Occupational History   Occupation: retired    Tobacco Use   Smoking status: Never   Smokeless tobacco: Never  Vaping Use   Vaping status: Never Used  Substance and Sexual Activity   Alcohol  use: No   Drug use: No   Sexual activity: Not Currently  Other Topics Concern   Not on file  Social History Narrative   Lives in a hotel and feels safe with his living arrangements.    Social Drivers of Dispensing optician  Resource Strain: Low Risk  (02/28/2024)   Overall Financial Resource Strain (CARDIA)    Difficulty of Paying Living Expenses: Not very hard  Food Insecurity: No Food Insecurity (02/28/2024)   Hunger Vital Sign    Worried About Running Out of Food in the Last Year: Never true    Ran Out of Food in the Last Year: Never true  Transportation Needs: No Transportation Needs (02/28/2024)   PRAPARE - Administrator, Civil Service (Medical): No    Lack of Transportation (Non-Medical): No  Physical Activity: Sufficiently Active (02/28/2024)   Exercise Vital Sign    Days of Exercise per Week: 5 days    Minutes of Exercise per Session: 30 min  Stress: No Stress Concern Present (02/28/2024)   [employer] of Occupational Health - Occupational Stress Questionnaire    Feeling of Stress: Not at all  Social Connections: Moderately Isolated (02/28/2024)   Social Connection and Isolation Panel    Frequency of Communication with Friends and Family: More than three times a week    Frequency of Social Gatherings with Friends and Family: Twice a week    Attends Religious Services: 1 to 4 times per year    Active Member of [employer] or Organizations: No    Attends Engineer, structural: Never    Marital Status: Never married    Objective:  BP 130/74   Pulse 75   Temp 98.1 F (36.7 C)   Ht 5' 7 (1.702 m)   Wt 194 lb (88 kg)   SpO2 98%   BMI 30.38 kg/m      03/03/2024    8:11 AM 02/28/2024    8:23 AM 02/19/2024    9:47 AM  BP/Weight  Systolic BP 130 -- 100  Diastolic BP 74 -- 60  Wt. (Lbs) 194 200 200  BMI  30.38 kg/m2 31.32 kg/m2 31.32 kg/m2    Physical Exam Vitals and nursing note reviewed.  Constitutional:      Appearance: He is obese.  HENT:     Head: Normocephalic and atraumatic.  Cardiovascular:     Rate and Rhythm: Normal rate and regular rhythm.  Pulmonary:     Effort: Pulmonary effort is normal.     Breath sounds: Normal breath sounds.  Musculoskeletal:        General: Tenderness (mild midline low back tenderness) present.  Neurological:     General: No focal deficit present.     Mental Status: He is alert.  Psychiatric:        Mood and Affect: Mood normal.         Lab Results  Component Value Date   WBC 3.7 12/17/2023   HGB 13.6 12/17/2023   HCT 40.6 12/17/2023   PLT 310 12/17/2023   GLUCOSE 138 (H) 12/03/2023   CHOL 157 09/17/2023   TRIG 141 09/17/2023   HDL 36 (L) 09/17/2023   LDLCALC 96 09/17/2023   ALT 7 12/03/2023   AST 11 (L) 12/03/2023   NA 141 12/03/2023   K 3.9 12/03/2023   CL 107 12/03/2023   CREATININE 1.22 12/03/2023   BUN 16 12/03/2023   CO2 27 12/03/2023   HGBA1C 6.4 (H) 12/17/2023   MICROALBUR 10 04/07/2021      Assessment & Plan:  Acute bilateral low back pain without sciatica Assessment & Plan: Chronic low back pain persisting for few weeks, localized to the low back without radiation to the legs, suggesting musculoskeletal origin rather than nerve involvement.  Previous urine culture was negative for infection. Pain is mild to moderate and does not suggest severe arthritis or pinched nerve. He prefers current management strategy over additional imaging at this time. - Prescribe Robaxin  (muscle relaxant) 750 mg, 30 tablets, to be taken couple times daily as needed. - Advise continuation of current regimen: Tylenol , heat or ice application. - Encourage stretching before and after exercise. - Monitor for any changes such as pain radiating to legs, tingling, numbness, or weakness, which would warrant further imaging like x-rays or  MRI.   Essential hypertension Assessment & Plan: Hypertension managed with carvedilol . He prefers current regimen of 6.25 mg once daily despite recommendation for twice daily dosing. Discussed that the medication is not a 24-hour pill and typically requires twice daily dosing, but he prefers once daily to avoid missing doses. - Continue carvedilol  6.25 mg once daily - refilled the medicine   Mixed hyperlipidemia Assessment & Plan: Hyperlipidemia managed with atorvastatin . Recent cholesterol levels were not concerning. - Continue atorvastatin  20 mg daily - refill sent   Other orders -     Atorvastatin  Calcium ; Take 1 tablet (20 mg total) by mouth daily.  Dispense: 90 tablet; Refill: 1 -     Carvedilol ; Take 1 tablet (6.25 mg total) by mouth daily.  Dispense: 90 tablet; Refill: 1 -     Spironolactone ; Take 0.5 tablets (12.5 mg total) by mouth daily.  Dispense: 45 tablet; Refill: 1 -     Methocarbamol ; Take 1 tablet (750 mg total) by mouth 2 (two) times daily as needed for up to 15 days for muscle spasms.  Dispense: 30 tablet; Refill: 0     Meds ordered this encounter  Medications   atorvastatin  (LIPITOR) 20 MG tablet    Sig: Take 1 tablet (20 mg total) by mouth daily.    Dispense:  90 tablet    Refill:  1   carvedilol  (COREG ) 6.25 MG tablet    Sig: Take 1 tablet (6.25 mg total) by mouth daily.    Dispense:  90 tablet    Refill:  1   spironolactone  (ALDACTONE ) 25 MG tablet    Sig: Take 0.5 tablets (12.5 mg total) by mouth daily.    Dispense:  45 tablet    Refill:  1   methocarbamol  (ROBAXIN ) 750 MG tablet    Sig: Take 1 tablet (750 mg total) by mouth 2 (two) times daily as needed for up to 15 days for muscle spasms.    Dispense:  30 tablet    Refill:  0   Assessment and Plan           No orders of the defined types were placed in this encounter.    Follow-up: No follow-ups on file.  An After Visit Summary was printed and given to the patient.  Kiley Solimine,  MD [facility] [phone]

## 2024-06-17 ENCOUNTER — Ambulatory Visit

## 2024-06-17 VITALS — BP 100/62 | HR 62 | Temp 97.0°F | Ht 67.0 in | Wt 200.6 lb

## 2024-06-17 DIAGNOSIS — I10 Essential (primary) hypertension: Secondary | ICD-10-CM

## 2024-06-17 DIAGNOSIS — E118 Type 2 diabetes mellitus with unspecified complications: Secondary | ICD-10-CM | POA: Diagnosis not present

## 2024-06-17 DIAGNOSIS — E6609 Other obesity due to excess calories: Secondary | ICD-10-CM | POA: Diagnosis not present

## 2024-06-17 DIAGNOSIS — E66811 Obesity, class 1: Secondary | ICD-10-CM

## 2024-06-17 DIAGNOSIS — R42 Dizziness and giddiness: Secondary | ICD-10-CM | POA: Insufficient documentation

## 2024-06-17 DIAGNOSIS — Z6831 Body mass index (BMI) 31.0-31.9, adult: Secondary | ICD-10-CM

## 2024-06-17 DIAGNOSIS — Z23 Encounter for immunization: Secondary | ICD-10-CM | POA: Diagnosis not present

## 2024-06-17 DIAGNOSIS — I5020 Unspecified systolic (congestive) heart failure: Secondary | ICD-10-CM | POA: Diagnosis not present

## 2024-06-17 MED ORDER — CARVEDILOL 3.125 MG PO TABS: 3.1250 mg | ORAL_TABLET | Freq: Every day | ORAL | 0 refills | Status: AC

## 2024-06-17 NOTE — Progress Notes (Signed)
 c  Subjective:  Patient ID: Mark [name], male    DOB: [birth year]  Age: 78 y.o. MRN: [id number]  Chief Complaint  Patient presents with   Medical Management of Chronic Issues    HPI: Discussed the use of AI scribe software for clinical note transcription with the patient, who gave verbal consent to proceed.  History of Present Illness   Mark [name] is a 78 year old male with hypertension and diabetes who presents for follow-up of medication management and dizziness.  Dizziness and orthostatic symptoms - Occasional dizziness when standing up quickly - Symptoms resolve when rising slowly - No associated chest pain, shortness of breath, or palpitations - Blood pressure measured at 100/62 mmHg  Hypertension and medication management - Currently taking carvedilol  6.25 mg once daily - Difficulty cutting carvedilol  tablets due to crumbling - Also taking spironolactone  12.5 mg daily and baby aspirin  - Sometimes skips medications on Saturdays but generally adheres to regimen - Desires to reduce sodium intake  Diabetes mellitus and glycemic control - Taking metformin  500 mg half tablet twice daily - Checks blood sugars daily, but did not check on day of visit - Recent low blood sugar readings attributed to lack of assistance with monitoring  Musculoskeletal symptoms - Intermittent bilateral low back pain, not severe - No new or worsening symptoms related to back pain  Peripheral symptoms - Occasional cramps in hands - Manages cramps with muscle supplement - No significant swelling in legs  Functional status and exercise tolerance - Walks five miles daily without shortness of breath  Dietary habits - Does not consistently eat healthily due to being single and frequently on the road - Frequently eats from salad bars - Expresses desire to improve diet and reduce sodium intake           06/17/2024    7:40 AM 02/28/2024    8:25 AM 12/06/2023    7:28 AM 09/13/2023    7:45  AM 03/09/2023    8:01 AM  Depression screen PHQ 2/9  Decreased Interest 0 0 0 0 2  Down, Depressed, Hopeless 0 0 0 0 0  PHQ - 2 Score 0 0 0 0 2  Altered sleeping     3  Tired, decreased energy     0  Change in appetite     0  Feeling bad or failure about yourself      0  Trouble concentrating     0  Moving slowly or fidgety/restless     0  Suicidal thoughts     0  PHQ-9 Score     5  Difficult doing work/chores     Not difficult at all        06/17/2024    7:39 AM  Fall Risk   Falls in the past year? 0  Number falls in past yr: 0  Injury with Fall? 0  Risk for fall due to : No Fall Risks  Follow up Falls evaluation completed    Patient Care Team: Weltha Cathy, MD as PCP - General (Family Medicine)   Review of Systems  Constitutional:  Negative for chills, fatigue and fever.  HENT:  Negative for congestion, ear pain, sinus pressure and sore throat.   Respiratory:  Negative for cough and shortness of breath.   Cardiovascular:  Negative for chest pain.  Gastrointestinal:  Negative for abdominal pain, constipation, diarrhea, nausea and vomiting.  Genitourinary:  Negative for dysuria and frequency.  Musculoskeletal:  Negative for arthralgias, back  pain and myalgias.  Neurological:  Negative for dizziness and headaches.  Psychiatric/Behavioral:  Negative for dysphoric mood. The patient is not nervous/anxious.     Current Outpatient Medications on File Prior to Visit  Medication Sig Dispense Refill   albuterol  (VENTOLIN  HFA) 108 (90 Base) MCG/ACT inhaler Inhale 1-2 puffs into the lungs as needed for wheezing or shortness of breath.     Alcohol  Swabs  (DROPSAFE ALCOHOL  PREP) 70 % PADS 1 each by Does not apply route 2 (two) times daily. as directed 200 each 3   aspirin  81 MG chewable tablet Chew 81 mg by mouth daily.     atorvastatin  (LIPITOR) 20 MG tablet Take 1 tablet (20 mg total) by mouth daily. 90 tablet 1   Blood Glucose Calibration (TRUE METRIX LEVEL 1) Low SOLN 1 each  by In Vitro route every 30 (thirty) days. 1 each 2   Blood Glucose Monitoring Suppl (TRUE METRIX AIR GLUCOSE METER) w/Device KIT 1 each by Does not apply route in the morning and at bedtime. 1 kit 0   fluticasone  (FLONASE ) 50 MCG/ACT nasal spray Place 2 sprays into both nostrils daily. 16 g 6   glucose blood (TRUE METRIX BLOOD GLUCOSE TEST) test strip TEST BLOOD SUGAR IN THE MORNING AND AT BEDTIME AS INSTRUCTED 200 strip 3   ipratropium (ATROVENT  HFA) 17 MCG/ACT inhaler Inhale 2 puffs into the lungs every 4 (four) hours as needed for wheezing. 1 each 12   Magnesium 500 MG TABS Take 500 mg by mouth in the morning.     metFORMIN  (GLUCOPHAGE ) 500 MG tablet TAKE 1/2 TABLET TWICE DAILY (NEED MD APPOINTMENT) 90 tablet 1   nitroGLYCERIN  (NITROSTAT ) 0.4 MG SL tablet DISSOLVE ONE TABLET UNDER TONGUE AS NEEDED FOR CHEST PAIN UP TO 3 TABLETS 25 tablet 11   spironolactone  (ALDACTONE ) 25 MG tablet Take 0.5 tablets (12.5 mg total) by mouth daily. 45 tablet 1   TRUEplus Lancets 30G MISC TEST BLOOD SUGAR IN THE MORNING AND AT BEDTIME. 200 each 3   No current facility-administered medications on file prior to visit.   Past Medical History:  Diagnosis Date   Acute renal failure superimposed on stage 3a chronic kidney disease (HCC) 12/08/2020   Arthritis    Atherosclerotic heart disease of native coronary artery with angina pectoris 07/20/2017   BMI 31.0-31.9,adult 03/29/2020   BMI 32.0-32.9,adult 03/29/2020   Cardiac murmur 03/02/2022   Cardiomyopathy (HCC) 03/16/2022   Cervical spondylosis 01/11/2022   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   ED (erectile dysfunction) 11/27/2019   Essential hypertension    Floaters 05/02/2022   HFrEF (heart failure with reduced ejection fraction) (HCC) 03/16/2022   History of BPH    Hyperlipidemia 07/20/2017   LBBB (left bundle branch block) 12/27/2021   Osteoarthritis of right knee 12/08/2020   Peripheral vascular disease    Type 2 diabetes mellitus with vascular  disease (HCC)    Past Surgical History:  Procedure Laterality Date   HERNIA REPAIR     INGUINAL HERNIA REPAIR Bilateral    LEFT HEART CATH AND CORONARY ANGIOGRAPHY N/A 03/23/2022   Procedure: LEFT HEART CATH AND CORONARY ANGIOGRAPHY;  Surgeon: Jordan, Peter M, MD;  Location: [facility];  Service: Cardiovascular;  Laterality: N/A;    Family History  Problem Relation Age of Onset   Cancer Mother    Hypertension Neg Hx    Heart disease Neg Hx    Diabetes Neg Hx    Social History   Socioeconomic History   Marital status:  Single    Spouse name: Not on file   Number of children: 4   Years of education: 31   Highest education level: 12th grade  Occupational History   Occupation: retired   Tobacco Use   Smoking status: Never   Smokeless tobacco: Never  Vaping Use   Vaping status: Never Used  Substance and Sexual Activity   Alcohol  use: No   Drug use: No   Sexual activity: Not Currently  Other Topics Concern   Not on file  Social History Narrative   Lives in a hotel and feels safe with his living arrangements.    Social Drivers of Corporate Investment Banker Strain: Low Risk  (02/28/2024)   Overall Financial Resource Strain (CARDIA)    Difficulty of Paying Living Expenses: Not very hard  Food Insecurity: No Food Insecurity (02/28/2024)   Hunger Vital Sign    Worried About Running Out of Food in the Last Year: Never true    Ran Out of Food in the Last Year: Never true  Transportation Needs: No Transportation Needs (02/28/2024)   PRAPARE - Administrator, Civil Service (Medical): No    Lack of Transportation (Non-Medical): No  Physical Activity: Sufficiently Active (02/28/2024)   Exercise Vital Sign    Days of Exercise per Week: 5 days    Minutes of Exercise per Session: 30 min  Stress: No Stress Concern Present (02/28/2024)   [employer] of Occupational Health - Occupational Stress Questionnaire    Feeling of Stress: Not at all  Social Connections:  Moderately Isolated (02/28/2024)   Social Connection and Isolation Panel    Frequency of Communication with Friends and Family: More than three times a week    Frequency of Social Gatherings with Friends and Family: Twice a week    Attends Religious Services: 1 to 4 times per year    Active Member of [employer] or Organizations: No    Attends Engineer, Structural: Never    Marital Status: Never married    Objective:  BP 100/62   Pulse 62   Temp (!) 97 F (36.1 C)   Ht 5' 7 (1.702 m)   Wt 200 lb 9.6 oz (91 kg)   SpO2 98%   BMI 31.42 kg/m      06/17/2024    7:35 AM 03/03/2024    8:11 AM 02/28/2024    8:23 AM  BP/Weight  Systolic BP 100 130 --  Diastolic BP 62 74 --  Wt. (Lbs) 200.6 194 200  BMI 31.42 kg/m2 30.38 kg/m2 31.32 kg/m2    Physical Exam Vitals and nursing note reviewed.  HENT:     Head: Normocephalic and atraumatic.  Eyes:     Pupils: Pupils are equal, round, and reactive to light.  Cardiovascular:     Rate and Rhythm: Normal rate and regular rhythm.  Pulmonary:     Effort: Pulmonary effort is normal.     Breath sounds: Normal breath sounds.  Musculoskeletal:        General: No swelling. Normal range of motion.     Cervical back: Normal range of motion.  Skin:    General: Skin is dry.  Neurological:     General: No focal deficit present.     Mental Status: Mark [name] is oriented to [place], place, and time.  Psychiatric:        Mood and Affect: Mood normal.      Diabetic foot exam was performed with the following findings:  No deformities, ulcerations, or other skin breakdown Normal sensation of 10g monofilament Intact posterior tibialis and dorsalis pedis pulses      Lab Results  Component Value Date   WBC 3.7 12/17/2023   HGB 13.6 12/17/2023   HCT 40.6 12/17/2023   PLT 310 12/17/2023   GLUCOSE 138 (H) 12/03/2023   CHOL 157 09/17/2023   TRIG 141 09/17/2023   HDL 36 (L) 09/17/2023   LDLCALC 96 09/17/2023   ALT 7 12/03/2023   AST 11 (L)  12/03/2023   NA 141 12/03/2023   K 3.9 12/03/2023   CL 107 12/03/2023   CREATININE 1.22 12/03/2023   BUN 16 12/03/2023   CO2 27 12/03/2023   HGBA1C 6.4 (H) 12/17/2023    Results for orders placed or performed in visit on 02/19/24  POCT URINALYSIS DIP (CLINITEK)   Collection Time: 02/19/24 10:13 AM  Result Value Ref Range   Color, UA straw (A) yellow   Clarity, UA cloudy (A) clear   Glucose, UA negative negative mg/dL   Bilirubin, UA small (A) negative   Ketones, POC UA trace (5) (A) negative mg/dL   Spec Grav, UA >=8.969 (A) 1.010 - 1.025   Blood, UA negative negative   pH, UA 6.0 5.0 - 8.0   POC PROTEIN,UA =30 (A) negative, trace   Urobilinogen, UA 0.2 0.2 or 1.0 E.U./dL   Nitrite, UA Negative Negative   Leukocytes, UA Trace (A) Negative  Urine Culture   Collection Time: 02/19/24 10:59 AM   Specimen: Urine   UR  Result Value Ref Range   Urine Culture, Routine Final report    Organism ID, Bacteria Comment   .  Assessment & Plan:   Assessment & Plan Controlled type 2 diabetes mellitus with complication, without long-term current use of insulin (HCC) Type 2 diabetes mellitus controlled with complications of CAD, vascular disease  Diabetes is well controlled with current medication regimen. Blood sugars are low, possibly due to inconsistent medication adherence. - Continue metformin  500 mg once daily. - Encouraged consistent medication adherence. Orders:   CBC with Differential   Comprehensive metabolic panel with GFR   Lipid Panel   Hemoglobin A1c   Microalbumin / creatinine urine ratio  Essential hypertension Essential hypertension Blood pressure is low at 100/62 mmHg, possibly due to current medication regimen. Experiencing orthostatic dizziness, likely related to low blood pressure. - Reduced carvedilol  dose to 3.25 mg daily. - Monitor blood pressure and symptoms of dizziness. - Ordered blood work to assess current status. Orders:   CBC with Differential    Comprehensive metabolic panel with GFR   Lipid Panel   Hemoglobin A1c  HFrEF (heart failure with reduced ejection fraction) (HCC) Last echo in 2023 July showed EF of 25-30% Appears to be well compensated at this time. On guideline directed medical therapy. Since Mark [name] reports walking 5 miles without any cardiovascular symptoms, and is compliant with his medications, I do not feel the need for him to have repeat echo at this time.  Plan to continue current treatment plan, eat low sodium diet and stay active.  Orders:   CBC with Differential   Comprehensive metabolic panel with GFR   Lipid Panel   Hemoglobin A1c  Postural dizziness  Orthostatic dizziness Experiencing dizziness upon standing, likely due to low blood pressure from current medication regimen. - Reduced carvedilol  dose to 3.25 mg daily to alleviate dizziness. - Advised on slow positional changes to prevent dizziness. Orders:   CBC with Differential   Comprehensive metabolic  panel with GFR   Lipid Panel   Hemoglobin A1c  Class 1 obesity due to excess calories with serious comorbidity and body mass index (BMI) of 31.0 to 31.9 in adult Obesity, class 1 with serious comorbidity of HFrEF and Diabetes type 2 Weight is stable. Diet is not optimal, but Mark [name] is attempting to eat healthier by incorporating more salad. - Encouraged healthier eating habits, including increased salad intake. - Advised on reducing sodium intake.          General Health Maintenance - Administered COVID vaccination. Mark [name] states Mark [name] had Flu shot this season in August. - Scheduled Medicare wellness visit.        Body mass index is 31.42 kg/m.    Meds ordered this encounter  Medications   carvedilol  (COREG ) 3.125 MG tablet    Sig: Take 1 tablet (3.125 mg total) by mouth daily.    Dispense:  90 tablet    Refill:  0    Dose cut to 3.125 mg and PATIENT ONLY PREFERS TO TAKE IT ONCE DAILY    Orders Placed This Encounter  Procedures   CBC with  Differential   Comprehensive metabolic panel with GFR   Lipid Panel   Hemoglobin A1c   Microalbumin / creatinine urine ratio       Follow-up: Return in about 6 months (around 12/15/2024) for chronic disease follow up.  An After Visit Summary was printed and given to the patient.  Raeshawn Tafolla, MD [facility] [phone]

## 2024-06-17 NOTE — Assessment & Plan Note (Addendum)
  Orthostatic dizziness Experiencing dizziness upon standing, likely due to low blood pressure from current medication regimen. - Reduced carvedilol  dose to 3.25 mg daily to alleviate dizziness. - Advised on slow positional changes to prevent dizziness. Orders:   CBC with Differential   Comprehensive metabolic panel with GFR   Lipid Panel   Hemoglobin A1c

## 2024-06-17 NOTE — Assessment & Plan Note (Addendum)
 Last echo in 2023 July showed EF of 25-30% Appears to be well compensated at this time. On guideline directed medical therapy. Since he reports walking 5 miles without any cardiovascular symptoms, and is compliant with his medications, I do not feel the need for him to have repeat echo at this time.  Plan to continue current treatment plan, eat low sodium diet and stay active.  Orders:   CBC with Differential   Comprehensive metabolic panel with GFR   Lipid Panel   Hemoglobin A1c

## 2024-06-17 NOTE — Assessment & Plan Note (Addendum)
 Type 2 diabetes mellitus controlled with complications of CAD, vascular disease  Diabetes is well controlled with current medication regimen. Blood sugars are low, possibly due to inconsistent medication adherence. - Continue metformin  500 mg once daily. - Encouraged consistent medication adherence. Orders:   CBC with Differential   Comprehensive metabolic panel with GFR   Lipid Panel   Hemoglobin A1c   Microalbumin / creatinine urine ratio

## 2024-06-17 NOTE — Assessment & Plan Note (Signed)
 Obesity, class 1 with serious comorbidity of HFrEF and Diabetes type 2 Weight is stable. Diet is not optimal, but he is attempting to eat healthier by incorporating more salad. - Encouraged healthier eating habits, including increased salad intake. - Advised on reducing sodium intake.

## 2024-06-17 NOTE — Assessment & Plan Note (Addendum)
 Essential hypertension Blood pressure is low at 100/62 mmHg, possibly due to current medication regimen. Experiencing orthostatic dizziness, likely related to low blood pressure. - Reduced carvedilol  dose to 3.25 mg daily. - Monitor blood pressure and symptoms of dizziness. - Ordered blood work to assess current status. Orders:   CBC with Differential   Comprehensive metabolic panel with GFR   Lipid Panel   Hemoglobin A1c

## 2024-06-18 LAB — MICROALBUMIN / CREATININE URINE RATIO
Creatinine, Urine: 175.6 mg/dL
Microalb/Creat Ratio: 2 mg/g{creat} (ref 0–29)
Microalbumin, Urine: 3.6 ug/mL

## 2024-06-19 ENCOUNTER — Other Ambulatory Visit

## 2024-06-19 DIAGNOSIS — I5020 Unspecified systolic (congestive) heart failure: Secondary | ICD-10-CM | POA: Diagnosis not present

## 2024-06-19 DIAGNOSIS — R42 Dizziness and giddiness: Secondary | ICD-10-CM | POA: Diagnosis not present

## 2024-06-19 DIAGNOSIS — I10 Essential (primary) hypertension: Secondary | ICD-10-CM | POA: Diagnosis not present

## 2024-06-19 DIAGNOSIS — E118 Type 2 diabetes mellitus with unspecified complications: Secondary | ICD-10-CM | POA: Diagnosis not present

## 2024-06-20 ENCOUNTER — Ambulatory Visit: Payer: Self-pay

## 2024-06-20 LAB — LIPID PANEL
Chol/HDL Ratio: 3.4 ratio (ref 0.0–5.0)
Cholesterol, Total: 162 mg/dL (ref 100–199)
HDL: 47 mg/dL (ref >39)
LDL Chol Calc (NIH): 98 mg/dL (ref 0–99)
Triglycerides: 94 mg/dL (ref 0–149)
VLDL Cholesterol Cal: 17 mg/dL (ref 5–40)

## 2024-06-20 LAB — COMPREHENSIVE METABOLIC PANEL WITH GFR
ALT: 6 IU/L (ref 0–44)
AST: 18 IU/L (ref 0–40)
Albumin: 4.4 g/dL (ref 3.8–4.8)
Alkaline Phosphatase: 76 IU/L (ref 47–123)
BUN/Creatinine Ratio: 12 (ref 10–24)
BUN: 16 mg/dL (ref 8–27)
Bilirubin Total: 0.8 mg/dL (ref 0.0–1.2)
CO2: 23 mmol/L (ref 20–29)
Calcium: 9.6 mg/dL (ref 8.6–10.2)
Chloride: 104 mmol/L (ref 96–106)
Creatinine, Ser: 1.29 mg/dL — ABNORMAL HIGH (ref 0.76–1.27)
Globulin, Total: 2.4 g/dL (ref 1.5–4.5)
Glucose: 100 mg/dL — ABNORMAL HIGH (ref 70–99)
Potassium: 4.3 mmol/L (ref 3.5–5.2)
Sodium: 140 mmol/L (ref 134–144)
Total Protein: 6.8 g/dL (ref 6.0–8.5)
eGFR: 57 mL/min/1.73 — ABNORMAL LOW (ref >59)

## 2024-06-20 LAB — HEMOGLOBIN A1C
Est. average glucose Bld gHb Est-mCnc: 128 mg/dL
Hgb A1c MFr Bld: 6.1 % — ABNORMAL HIGH (ref 4.8–5.6)

## 2024-06-20 LAB — CBC WITH DIFFERENTIAL/PLATELET
Basophils Absolute: 0 x10E3/uL (ref 0.0–0.2)
Basos: 1 %
EOS (ABSOLUTE): 0.1 x10E3/uL (ref 0.0–0.4)
Eos: 2 %
Hematocrit: 45.1 % (ref 37.5–51.0)
Hemoglobin: 14.5 g/dL (ref 13.0–17.7)
Immature Grans (Abs): 0 x10E3/uL (ref 0.0–0.1)
Immature Granulocytes: 0 %
Lymphocytes Absolute: 1.7 x10E3/uL (ref 0.7–3.1)
Lymphs: 47 %
MCH: 32.1 pg (ref 26.6–33.0)
MCHC: 32.2 g/dL (ref 31.5–35.7)
MCV: 100 fL — ABNORMAL HIGH (ref 79–97)
Monocytes Absolute: 0.4 x10E3/uL (ref 0.1–0.9)
Monocytes: 12 %
Neutrophils Absolute: 1.3 x10E3/uL — ABNORMAL LOW (ref 1.4–7.0)
Neutrophils: 38 %
Platelets: 190 x10E3/uL (ref 150–450)
RBC: 4.52 x10E6/uL (ref 4.14–5.80)
RDW: 13.7 % (ref 11.6–15.4)
WBC: 3.5 x10E3/uL (ref 3.4–10.8)

## 2024-07-12 ENCOUNTER — Encounter (HOSPITAL_COMMUNITY): Payer: Self-pay

## 2024-07-12 ENCOUNTER — Emergency Department (HOSPITAL_COMMUNITY)
Admission: EM | Admit: 2024-07-12 | Discharge: 2024-07-12 | Disposition: A | Discharge status: Home / Self Care | Attending: Emergency Medicine | Admitting: Emergency Medicine

## 2024-07-12 ENCOUNTER — Emergency Department (HOSPITAL_COMMUNITY)

## 2024-07-12 ENCOUNTER — Other Ambulatory Visit: Payer: Self-pay

## 2024-07-12 DIAGNOSIS — Z7982 Long term (current) use of aspirin: Secondary | ICD-10-CM | POA: Insufficient documentation

## 2024-07-12 DIAGNOSIS — R0789 Other chest pain: Secondary | ICD-10-CM | POA: Insufficient documentation

## 2024-07-12 DIAGNOSIS — R0781 Pleurodynia: Secondary | ICD-10-CM | POA: Diagnosis not present

## 2024-07-12 DIAGNOSIS — M546 Pain in thoracic spine: Secondary | ICD-10-CM | POA: Insufficient documentation

## 2024-07-12 LAB — CBC
HCT: 39.6 % (ref 39.0–52.0)
Hemoglobin: 12.9 g/dL — ABNORMAL LOW (ref 13.0–17.0)
MCH: 31.8 pg (ref 26.0–34.0)
MCHC: 32.6 g/dL (ref 30.0–36.0)
MCV: 97.5 fL (ref 80.0–100.0)
Platelets: 170 K/uL (ref 150–400)
RBC: 4.06 MIL/uL — ABNORMAL LOW (ref 4.22–5.81)
RDW: 13.5 % (ref 11.5–15.5)
WBC: 5.3 K/uL (ref 4.0–10.5)
nRBC: 0 % (ref 0.0–0.2)

## 2024-07-12 LAB — HEPATIC FUNCTION PANEL
ALT: 20 U/L (ref 0–44)
AST: 25 U/L (ref 15–41)
Albumin: 4 g/dL (ref 3.5–5.0)
Alkaline Phosphatase: 68 U/L (ref 38–126)
Bilirubin, Direct: 0.2 mg/dL (ref 0.0–0.2)
Indirect Bilirubin: 0.2 mg/dL — ABNORMAL LOW (ref 0.3–0.9)
Total Bilirubin: 0.4 mg/dL (ref 0.0–1.2)
Total Protein: 6.6 g/dL (ref 6.5–8.1)

## 2024-07-12 LAB — BASIC METABOLIC PANEL WITH GFR
Anion gap: 8 (ref 5–15)
BUN: 15 mg/dL (ref 8–23)
CO2: 25 mmol/L (ref 22–32)
Calcium: 9.3 mg/dL (ref 8.9–10.3)
Chloride: 109 mmol/L (ref 98–111)
Creatinine, Ser: 1.37 mg/dL — ABNORMAL HIGH (ref 0.61–1.24)
GFR, Estimated: 53 mL/min — ABNORMAL LOW (ref >60)
Glucose, Bld: 98 mg/dL (ref 70–99)
Potassium: 4.6 mmol/L (ref 3.5–5.1)
Sodium: 142 mmol/L (ref 135–145)

## 2024-07-12 LAB — TROPONIN T, HIGH SENSITIVITY
Troponin T High Sensitivity: <15 ng/L (ref 0–19)
Troponin T High Sensitivity: <15 ng/L (ref 0–19)

## 2024-07-12 MED ORDER — HYDROCODONE-ACETAMINOPHEN 5-325 MG PO TABS
1.0000 | ORAL_TABLET | Freq: Once | ORAL | Status: AC
  Administered 2024-07-12: 1 via ORAL
  Filled 2024-07-12: qty 1

## 2024-07-12 NOTE — ED Notes (Signed)
 Patient alert and oriented x 4. Airway patent, respirations even and unlabored. Skin normal, warm and dry. Discharge instructions discussed with patient and patient's family. Patient has no questions at this time. Patient has safe ride home.

## 2024-07-12 NOTE — Discharge Instructions (Signed)
 Take Tylenol  for pain and follow-up with your family doctor this week for recheck.  Return sooner if problems

## 2024-07-12 NOTE — ED Notes (Signed)
 Patient reports generalized CP/epigastric pain that started this AM, HX of MI 2010, daily ASA, denies n/v/fever/SOB, pain radiating to mid back. Patient is alert and oriented x 4. Airway patent, respirations even and unlabored. Skin normal, warm and dry. Patient has no new complaints at this time. Siderails up x 2. Call light at bedside.

## 2024-07-12 NOTE — ED Triage Notes (Signed)
 Patient has centralized chest pain that moves to his upper back since this morning. No nausea or vomiting. No shortness of breath. When he coughs he feels the pain.

## 2024-07-15 NOTE — ED Provider Notes (Signed)
 [place] EMERGENCY DEPARTMENT AT [facility] Provider Note   CSN: [id number] Arrival date & time: 07/12/24  1202     Patient presents with: Chest Pain   Mark [name] is a 78 y.o. male.   Patient complains of upper back discomfort and occasional chest discomfort.  No fevers no chills no vomiting no shortness of breath  The history is provided by the patient and medical records. No language interpreter was used.  Chest Pain Pain location:  L chest Pain quality: aching   Pain radiates to:  Does not radiate Pain severity:  Mild Onset quality:  Sudden Timing:  Intermittent Progression:  Waxing and waning Chronicity:  New Context: not breathing   Relieved by:  Nothing Worsened by:  Nothing Ineffective treatments:  None tried Associated symptoms: no abdominal pain, no back pain, no cough, no fatigue and no headache        Prior to Admission medications   Medication Sig Start Date End Date Taking? Authorizing Provider  albuterol  (VENTOLIN  HFA) 108 (90 Base) MCG/ACT inhaler Inhale 1-2 puffs into the lungs as needed for wheezing or shortness of breath. 06/23/19   [provider]  Alcohol  Swabs  (DROPSAFE ALCOHOL  PREP) 70 % PADS 1 each by Does not apply route 2 (two) times daily. as directed 02/10/23   CoxAbigail, MD  aspirin  81 MG chewable tablet Chew 81 mg by mouth daily.    [provider]  atorvastatin  (LIPITOR) 20 MG tablet Take 1 tablet (20 mg total) by mouth daily. 03/03/24   Sirivol, Mamatha, MD  Blood Glucose Calibration (TRUE METRIX LEVEL 1) Low SOLN 1 each by In Vitro route every 30 (thirty) days. 11/07/21   Abran Jerilynn Loving, MD  Blood Glucose Monitoring Suppl (TRUE METRIX AIR GLUCOSE METER) w/Device KIT 1 each by Does not apply route in the morning and at bedtime. 11/03/21   Abran Jerilynn Loving, MD  carvedilol  (COREG ) 3.125 MG tablet Take 1 tablet (3.125 mg total) by mouth daily. 06/17/24 09/15/24  Sirivol, Mamatha, MD  fluticasone   (FLONASE ) 50 MCG/ACT nasal spray Place 2 sprays into both nostrils daily. 09/13/23   Sirivol, Mamatha, MD  glucose blood (TRUE METRIX BLOOD GLUCOSE TEST) test strip TEST BLOOD SUGAR IN THE MORNING AND AT BEDTIME AS INSTRUCTED 02/07/23   Cox, Abigail, MD  ipratropium (ATROVENT  HFA) 17 MCG/ACT inhaler Inhale 2 puffs into the lungs every 4 (four) hours as needed for wheezing. 08/31/23   Dreama, [place]  N, FNP  Magnesium 500 MG TABS Take 500 mg by mouth in the morning.    [provider]  metFORMIN  (GLUCOPHAGE ) 500 MG tablet TAKE 1/2 TABLET TWICE DAILY (NEED MD APPOINTMENT) 01/13/24   Cox, Abigail, MD  nitroGLYCERIN  (NITROSTAT ) 0.4 MG SL tablet DISSOLVE ONE TABLET UNDER TONGUE AS NEEDED FOR CHEST PAIN UP TO 3 TABLETS 05/02/22   Abran Jerilynn Loving, MD  spironolactone  (ALDACTONE ) 25 MG tablet Take 0.5 tablets (12.5 mg total) by mouth daily. 03/03/24   Sirivol, Mamatha, MD  TRUEplus Lancets 30G MISC TEST BLOOD SUGAR IN THE MORNING AND AT BEDTIME. 02/07/23   CoxAbigail, MD    Allergies: Penicillins    Review of Systems  Constitutional:  Negative for appetite change and fatigue.  HENT:  Negative for congestion, ear discharge and sinus pressure.   Eyes:  Negative for discharge.  Respiratory:  Negative for cough.   Cardiovascular:  Positive for chest pain.  Gastrointestinal:  Negative for abdominal pain and diarrhea.  Genitourinary:  Negative for frequency  and hematuria.  Musculoskeletal:  Negative for back pain.  Skin:  Negative for rash.  Neurological:  Negative for seizures and headaches.  Psychiatric/Behavioral:  Negative for hallucinations.     Updated Vital Signs BP 123/70   Pulse (!) 57   Temp 98.7 F (37.1 C) (Oral)   Resp 18   Ht 5' 7 (1.702 m)   Wt 81.6 kg   SpO2 99%   BMI 28.19 kg/m   Physical Exam Vitals and nursing note reviewed.  Constitutional:      Appearance: He is well-developed.  HENT:     Head: Normocephalic.     Nose: Nose normal.  Eyes:     General:  No scleral icterus.    Conjunctiva/sclera: Conjunctivae normal.  Neck:     Thyroid: No thyromegaly.  Cardiovascular:     Rate and Rhythm: Normal rate and regular rhythm.     Heart sounds: No murmur heard.    No friction rub. No gallop.  Pulmonary:     Breath sounds: No stridor. No wheezing or rales.  Chest:     Chest wall: No tenderness.  Abdominal:     General: There is no distension.     Tenderness: There is no abdominal tenderness. There is no rebound.  Musculoskeletal:        General: Normal range of motion.     Cervical back: Neck supple.  Lymphadenopathy:     Cervical: No cervical adenopathy.  Skin:    Findings: No erythema or rash.  Neurological:     Mental Status: He is alert and oriented to [place], place, and time.     Motor: No abnormal muscle tone.     Coordination: Coordination normal.  Psychiatric:        Behavior: Behavior normal.     (all labs ordered are listed, but only abnormal results are displayed) Labs Reviewed  BASIC METABOLIC PANEL WITH GFR - Abnormal; Notable for the following components:      Result Value   Creatinine, Ser 1.37 (*)    GFR, Estimated 53 (*)    All other components within normal limits  CBC - Abnormal; Notable for the following components:   RBC 4.06 (*)    Hemoglobin 12.9 (*)    All other components within normal limits  HEPATIC FUNCTION PANEL - Abnormal; Notable for the following components:   Indirect Bilirubin 0.2 (*)    All other components within normal limits  TROPONIN T, HIGH SENSITIVITY  TROPONIN T, HIGH SENSITIVITY    EKG: EKG Interpretation Date/Time:  Saturday July 12 2024 12:08:52 EST Ventricular Rate:  65 PR Interval:  175 QRS Duration:  169 QT Interval:  504 QTC Calculation: 525 R Axis:   80  Text Interpretation: Sinus rhythm Multiple ventricular premature complexes Left bundle branch block Confirmed by Suzette Pac [phone]) on 07/12/2024 2:11:21 PM  Radiology: No results found.   Procedures    Medications Ordered in the ED  HYDROcodone-acetaminophen  (NORCO/VICODIN) 5-325 MG per tablet 1 tablet (1 tablet Oral Given 07/12/24 1536)                                    Medical Decision Making Amount and/or Complexity of Data Reviewed Labs: ordered. Radiology: ordered.  Risk Prescription drug management.   Chest x-ray, EKG, troponin are all unremarkable.  Patient improved with pain medicine in the emergency department.  Suspect musculoskeletal discomfort.  He will follow-up  with his PCP     Final diagnoses:  Chest wall pain    ED Discharge Orders     None          Suzette Pac, MD 07/15/24 1042

## 2024-07-28 NOTE — Progress Notes (Signed)
° °  07/28/2024  Patient ID: Mark [name], male   DOB: [birth year], 78 y.o.   MRN: [id number]  Pharmacy Quality Measure Review  This patient is appearing on a report for being at risk of failing the adherence measure for cholesterol (statin) and diabetes medications this calendar year.   Medication: metformin  and atorvastatin  Last fill date: 07/10/24 each for 90 day supply  Insurance report was not up to date. No action needed at this time.   Lang Sieve, PharmD, BCGP Clinical Pharmacist  [phone]

## 2024-12-16 ENCOUNTER — Ambulatory Visit

## 2025-03-05 ENCOUNTER — Ambulatory Visit
